# Patient Record
Sex: Male | Born: 1940 | Race: White | Hispanic: No | Smoking: Former smoker
Health system: Southern US, Community
[De-identification: ages and names within clinical notes are randomized; demographics above are authoritative.]

## PROBLEM LIST (undated history)

## (undated) DIAGNOSIS — D126 Benign neoplasm of colon, unspecified: Secondary | ICD-10-CM

## (undated) DIAGNOSIS — N529 Male erectile dysfunction, unspecified: Secondary | ICD-10-CM

## (undated) DIAGNOSIS — G4700 Insomnia, unspecified: Secondary | ICD-10-CM

## (undated) DIAGNOSIS — Z8551 Personal history of malignant neoplasm of bladder: Secondary | ICD-10-CM

## (undated) DIAGNOSIS — J310 Chronic rhinitis: Secondary | ICD-10-CM

## (undated) DIAGNOSIS — D179 Benign lipomatous neoplasm, unspecified: Secondary | ICD-10-CM

## (undated) DIAGNOSIS — M549 Dorsalgia, unspecified: Secondary | ICD-10-CM

## (undated) DIAGNOSIS — D649 Anemia, unspecified: Secondary | ICD-10-CM

## (undated) DIAGNOSIS — G629 Polyneuropathy, unspecified: Secondary | ICD-10-CM

## (undated) DIAGNOSIS — I2510 Atherosclerotic heart disease of native coronary artery without angina pectoris: Secondary | ICD-10-CM

## (undated) DIAGNOSIS — K648 Other hemorrhoids: Secondary | ICD-10-CM

## (undated) DIAGNOSIS — I509 Heart failure, unspecified: Secondary | ICD-10-CM

## (undated) DIAGNOSIS — I10 Essential (primary) hypertension: Secondary | ICD-10-CM

## (undated) DIAGNOSIS — K5790 Diverticulosis of intestine, part unspecified, without perforation or abscess without bleeding: Secondary | ICD-10-CM

## (undated) DIAGNOSIS — E782 Mixed hyperlipidemia: Secondary | ICD-10-CM

## (undated) DIAGNOSIS — G8929 Other chronic pain: Secondary | ICD-10-CM

## (undated) HISTORY — DX: Other chronic pain: G89.29

## (undated) HISTORY — DX: Polyneuropathy, unspecified: G62.9

## (undated) HISTORY — DX: Essential (primary) hypertension: I10

## (undated) HISTORY — DX: Benign lipomatous neoplasm, unspecified: D17.9

## (undated) HISTORY — DX: Personal history of malignant neoplasm of bladder: Z85.51

## (undated) HISTORY — DX: Benign neoplasm of colon, unspecified: D12.6

## (undated) HISTORY — DX: Other hemorrhoids: K64.8

## (undated) HISTORY — DX: Atherosclerotic heart disease of native coronary artery without angina pectoris: I25.10

## (undated) HISTORY — DX: Anemia, unspecified: D64.9

## (undated) HISTORY — DX: Insomnia, unspecified: G47.00

## (undated) HISTORY — DX: Chronic rhinitis: J31.0

## (undated) HISTORY — DX: Diverticulosis of intestine, part unspecified, without perforation or abscess without bleeding: K57.90

## (undated) HISTORY — DX: Mixed hyperlipidemia: E78.2

## (undated) HISTORY — DX: Other chronic pain: M54.9

## (undated) HISTORY — PX: TONSILLECTOMY: SUR1361

## (undated) HISTORY — DX: Male erectile dysfunction, unspecified: N52.9

---

## 1992-10-05 HISTORY — PX: CORONARY ARTERY BYPASS GRAFT: SHX141

## 2007-08-26 ENCOUNTER — Ambulatory Visit: Payer: Self-pay | Admitting: Gastroenterology

## 2007-09-09 ENCOUNTER — Encounter: Payer: Self-pay | Admitting: Gastroenterology

## 2007-09-09 ENCOUNTER — Ambulatory Visit: Payer: Self-pay | Admitting: Gastroenterology

## 2008-02-20 ENCOUNTER — Encounter: Payer: Self-pay | Admitting: Family Medicine

## 2008-12-27 ENCOUNTER — Ambulatory Visit: Payer: Self-pay | Admitting: Family Medicine

## 2008-12-27 DIAGNOSIS — E782 Mixed hyperlipidemia: Secondary | ICD-10-CM

## 2008-12-27 DIAGNOSIS — F528 Other sexual dysfunction not due to a substance or known physiological condition: Secondary | ICD-10-CM | POA: Insufficient documentation

## 2008-12-27 DIAGNOSIS — E785 Hyperlipidemia, unspecified: Secondary | ICD-10-CM | POA: Insufficient documentation

## 2008-12-27 DIAGNOSIS — I10 Essential (primary) hypertension: Secondary | ICD-10-CM | POA: Insufficient documentation

## 2008-12-27 HISTORY — DX: Mixed hyperlipidemia: E78.2

## 2008-12-28 LAB — CONVERTED CEMR LAB
AST: 24 units/L (ref 0–37)
Chloride: 103 meq/L (ref 96–112)
Cholesterol: 143 mg/dL (ref 0–200)
GFR calc non Af Amer: 102.6 mL/min (ref >60)
Glucose, Bld: 97 mg/dL (ref 70–99)
HDL: 40.1 mg/dL (ref >39.00)
Total Bilirubin: 1.2 mg/dL (ref 0.3–1.2)
Total CHOL/HDL Ratio: 4
Total Protein: 7.4 g/dL (ref 6.0–8.3)
Triglycerides: 146 mg/dL (ref 0.0–149.0)

## 2009-03-27 ENCOUNTER — Telehealth: Payer: Self-pay | Admitting: *Deleted

## 2009-07-12 ENCOUNTER — Encounter: Admission: RE | Admit: 2009-07-12 | Discharge: 2009-07-12 | Payer: Self-pay | Admitting: Neurosurgery

## 2009-09-06 ENCOUNTER — Encounter (INDEPENDENT_AMBULATORY_CARE_PROVIDER_SITE_OTHER): Payer: Self-pay | Admitting: *Deleted

## 2009-09-06 ENCOUNTER — Ambulatory Visit: Payer: Self-pay | Admitting: Internal Medicine

## 2009-09-06 ENCOUNTER — Inpatient Hospital Stay (HOSPITAL_COMMUNITY): Admission: EM | Admit: 2009-09-06 | Discharge: 2009-09-09 | Payer: Self-pay | Admitting: Emergency Medicine

## 2009-09-06 ENCOUNTER — Telehealth: Payer: Self-pay | Admitting: Family Medicine

## 2009-09-06 LAB — CONVERTED CEMR LAB
CO2: 26 meq/L
Calcium: 9 mg/dL
Cholesterol: 116 mg/dL
Creatinine, Ser: 1.09 mg/dL
Sodium: 139 meq/L

## 2009-09-08 ENCOUNTER — Encounter (INDEPENDENT_AMBULATORY_CARE_PROVIDER_SITE_OTHER): Payer: Self-pay | Admitting: *Deleted

## 2009-09-08 LAB — CONVERTED CEMR LAB
Calcium: 8.8 mg/dL
Chloride: 107 meq/L
GFR calc non Af Amer: >60 mL/min
Glomerular Filtration Rate, Af Am: >60 mL/min/{1.73_m2}
Glucose, Bld: 98 mg/dL
Sodium: 141 meq/L

## 2009-09-19 ENCOUNTER — Encounter: Admission: RE | Admit: 2009-09-19 | Discharge: 2009-09-19 | Payer: Self-pay | Admitting: Neurosurgery

## 2009-10-07 ENCOUNTER — Encounter (INDEPENDENT_AMBULATORY_CARE_PROVIDER_SITE_OTHER): Payer: Self-pay | Admitting: *Deleted

## 2009-10-07 ENCOUNTER — Ambulatory Visit: Payer: Self-pay | Admitting: Cardiology

## 2009-10-07 DIAGNOSIS — I2510 Atherosclerotic heart disease of native coronary artery without angina pectoris: Secondary | ICD-10-CM | POA: Insufficient documentation

## 2009-10-07 HISTORY — DX: Atherosclerotic heart disease of native coronary artery without angina pectoris: I25.10

## 2009-11-11 ENCOUNTER — Telehealth: Payer: Self-pay | Admitting: Family Medicine

## 2009-11-15 ENCOUNTER — Ambulatory Visit: Payer: Self-pay | Admitting: Family Medicine

## 2009-11-15 DIAGNOSIS — J310 Chronic rhinitis: Secondary | ICD-10-CM

## 2009-11-15 DIAGNOSIS — D179 Benign lipomatous neoplasm, unspecified: Secondary | ICD-10-CM | POA: Insufficient documentation

## 2009-11-15 HISTORY — DX: Chronic rhinitis: J31.0

## 2009-11-15 LAB — CONVERTED CEMR LAB
Cholesterol, target level: 200 mg/dL
HDL goal, serum: 40 mg/dL
LDL Goal: 100 mg/dL
Specific Gravity, Urine: 1.025
pH: 5

## 2010-01-02 ENCOUNTER — Telehealth: Payer: Self-pay | Admitting: Family Medicine

## 2010-03-22 ENCOUNTER — Encounter: Payer: Self-pay | Admitting: Cardiology

## 2010-03-30 LAB — CONVERTED CEMR LAB
ALT: 19 units/L (ref 0–53)
AST: 19 units/L (ref 0–37)
Albumin: 4.3 g/dL (ref 3.5–5.2)
Cholesterol: 139 mg/dL (ref 0–200)
HDL: 35 mg/dL — ABNORMAL LOW (ref >39)
Indirect Bilirubin: 0.7 mg/dL (ref 0.0–0.9)
Total Bilirubin: 0.9 mg/dL (ref 0.3–1.2)
Total CHOL/HDL Ratio: 4
Total Protein: 6.9 g/dL (ref 6.0–8.3)
Triglycerides: 148 mg/dL (ref <150)
VLDL: 30 mg/dL (ref 0–40)

## 2010-03-31 ENCOUNTER — Encounter: Payer: Self-pay | Admitting: Cardiology

## 2010-04-24 ENCOUNTER — Ambulatory Visit: Payer: Self-pay | Admitting: Family Medicine

## 2010-04-24 DIAGNOSIS — G4700 Insomnia, unspecified: Secondary | ICD-10-CM | POA: Insufficient documentation

## 2010-05-07 ENCOUNTER — Ambulatory Visit: Payer: Self-pay | Admitting: Cardiology

## 2010-05-08 ENCOUNTER — Encounter: Payer: Self-pay | Admitting: Cardiology

## 2010-05-14 ENCOUNTER — Ambulatory Visit: Payer: Self-pay | Admitting: Family Medicine

## 2010-05-14 DIAGNOSIS — R300 Dysuria: Secondary | ICD-10-CM | POA: Insufficient documentation

## 2010-05-14 LAB — CONVERTED CEMR LAB
Glucose, Urine, Semiquant: NEGATIVE
Ketones, urine, test strip: NEGATIVE
Protein, U semiquant: NEGATIVE
Specific Gravity, Urine: <1.005
Urobilinogen, UA: 0.2
pH: 6

## 2010-05-15 ENCOUNTER — Encounter: Payer: Self-pay | Admitting: Family Medicine

## 2010-11-04 NOTE — Assessment & Plan Note (Signed)
Summary: med refills/nn   Vital Signs:  Patient profile:   70 year old male Weight:      247 pounds Temp:     98.7 degrees F oral BP sitting:   142 / 80  (left arm) Cuff size:   regular  Vitals Entered By: Sid Falcon LPN (April 24, 2010 1:25 PM) CC: med refills, Hypertension Management   History of Present Illness: Medical followup. Patient has history of coronary artery disease, hypertension, hyperlipidemia, erectile dysfunction.  patient had recent lipid panel. Lipids were favorable. No recent chest pains.  hypertension fair control by home readings. No recent headaches. Occasional vertigo symptoms mostly at night and early morning. Patient needs refills of medication including lisinopril, Lipitor, and gabapentin.  patient complains of some difficulty falling asleep. He's not tried any medications for this. No daytime napping. No late day caffeine use. No alcohol use.  Hypertension History:      He denies headache, chest pain, palpitations, dyspnea with exertion, orthopnea, PND, peripheral edema, visual symptoms, neurologic problems, syncope, and side effects from treatment.        Positive major cardiovascular risk factors include male age 23 years old or older, hyperlipidemia, and hypertension.  Negative major cardiovascular risk factors include no history of diabetes, negative family history for ischemic heart disease, and non-tobacco-user status.        Positive history for target organ damage include ASHD (either angina/prior MI/prior CABG).  Further assessment for target organ damage reveals no history of stroke/TIA or peripheral vascular disease.     Allergies (verified): No Known Drug Allergies  Past History:  Past Medical History: Last updated: 10/07/2009 Hyperlipidemia Hypertension CAD - multivessel, LVEF 55-60 %, basal inferior hypokinesis Erectile dysfunction Peripheral neuropathy Chronic back pain  Review of Systems  The patient denies anorexia, fever,  weight loss, chest pain, syncope, dyspnea on exertion, peripheral edema, abdominal pain, melena, hematochezia, and severe indigestion/heartburn.    Physical Exam  General:  Well-developed,well-nourished,in no acute distress; alert,appropriate and cooperative throughout examination Mouth:  Oral mucosa and oropharynx without lesions or exudates.  Teeth in good repair. Neck:  No deformities, masses, or tenderness noted. Lungs:  Normal respiratory effort, chest expands symmetrically. Lungs are clear to auscultation, no crackles or wheezes. Heart:  Normal rate and regular rhythm. S1 and S2 normal without gallop, murmur, click, rub or other extra sounds. Extremities:  no edema Psych:  normally interactive, good eye contact, not anxious appearing, and not depressed appearing.     Impression & Recommendations:  Problem # 1:  HYPERTENSION (ICD-401.9)  His updated medication list for this problem includes:    Lisinopril 10 Mg Tabs (Lisinopril) ..... Once daily    Metoprolol Tartrate 25 Mg Tabs (Metoprolol tartrate) .Marland Kitchen... Take 1 tab two times a day  Orders: Prescription Created Electronically [phone])  Problem # 2:  HYPERLIPIDEMIA (ICD-272.4)  The following medications were removed from the medication list:    Vytorin 10-80 Mg Tabs (Ezetimibe-simvastatin) ..... Once daily His updated medication list for this problem includes:    Lipitor 40 Mg Tabs (Atorvastatin calcium) ..... One by mouth once daily  Orders: Prescription Created Electronically [phone])  Problem # 3:  CORONARY ATHEROSCLEROSIS NATIVE CORONARY ARTERY (ICD-414.01)  His updated medication list for this problem includes:    Lisinopril 10 Mg Tabs (Lisinopril) ..... Once daily    Aspirin Adult Low Strength 81 Mg Tbec (Aspirin) ..... Once daily    Plavix 75 Mg Tabs (Clopidogrel bisulfate) .Marland Kitchen... Take 1 tab daily  Isosorbide Mononitrate Cr 30 Mg Xr24h-tab (Isosorbide mononitrate) .Marland Kitchen... Take 1 tab daily    Metoprolol Tartrate 25 Mg  Tabs (Metoprolol tartrate) .Marland Kitchen... Take 1 tab two times a day    Nitrostat 0.4 Mg Subl (Nitroglycerin) .Marland Kitchen... Take as needed for chest pain  Problem # 4:  INSOMNIA (ICD-780.52) discused sleep hygiene.  He will try short term use of tylenol PM.  Complete Medication List: 1)  Gabapentin 300 Mg Caps (Gabapentin) .... Two tabs po two times a day 2)  Lisinopril 10 Mg Tabs (Lisinopril) .... Once daily 3)  Aspirin Adult Low Strength 81 Mg Tbec (Aspirin) .... Once daily 4)  Plavix 75 Mg Tabs (Clopidogrel bisulfate) .... Take 1 tab daily 5)  Isosorbide Mononitrate Cr 30 Mg Xr24h-tab (Isosorbide mononitrate) .... Take 1 tab daily 6)  Metoprolol Tartrate 25 Mg Tabs (Metoprolol tartrate) .... Take 1 tab two times a day 7)  Nitrostat 0.4 Mg Subl (Nitroglycerin) .... Take as needed for chest pain 8)  Hydrocodone-acetaminophen 10-500 Mg Tabs (Hydrocodone-acetaminophen) .... Take as needed for pian 9)  Fluticasone Propionate 50 Mcg/act Susp (Fluticasone propionate) .... 2 sprays per nostril once daily 10)  Lipitor 40 Mg Tabs (Atorvastatin calcium) .... One by mouth once daily  Hypertension Assessment/Plan:      The patient's hypertensive risk group is category C: Target organ damage and/or diabetes.  Today's blood pressure is 142/80.    Patient Instructions: 1)  Please schedule a follow-up appointment in 6 months .  2)  Try Tylenol PM as needed for sleep Prescriptions: LIPITOR 40 MG TABS (ATORVASTATIN CALCIUM) one by mouth once daily  #30 x 11   Entered and Authorized by:   Evelena Peat MD   Signed by:   Evelena Peat MD on 04/24/2010   Method used:   Electronically to        [facility]* (retail)       [address]       [place], [place]  [address]       Ph: [phone]       Fax: [phone]   RxID:   [id number] LISINOPRIL 10 MG TABS (LISINOPRIL) once daily  #90 Each x 3   Entered and Authorized by:   Evelena Peat MD   Signed by:   Evelena Peat MD  on 04/24/2010   Method used:   Electronically to        [facility]* (retail)       [address]       [place], [place]  [address]       Ph: [phone]       Fax: [phone]   RxID:   [id number] GABAPENTIN 300 MG CAPS (GABAPENTIN) two tabs po two times a day  #120 x 11   Entered and Authorized by:   Evelena Peat MD   Signed by:   Evelena Peat MD on 04/24/2010   Method used:   Electronically to        [facility]* (retail)       [address]       [place], [place]  [address]       Ph: [phone]       Fax: [phone]   RxID:   [id number]

## 2010-11-04 NOTE — Assessment & Plan Note (Signed)
Summary: Y9M  Medications Added ISOSORBIDE MONONITRATE CR 30 MG XR24H-TAB (ISOSORBIDE MONONITRATE) take 1 tab daily      Allergies Added: NKDA  Visit Type:  Follow-up Primary Provider:  Dr. Evelena Peat   History of Present Illness: 70 year old male presents for followup. He denies any significant angina, nitroglycerin use, or progressive shortness of breath. He reports a recent followup visit with his primary care physician.  Followup labs from 18 June showed cholesterol 139, triglycerides 148, HDL 35, LDL 74, AST 19, ALT 19. We reviewed these today. He is tolerating Lipitor.  Reports problems with back pain, and saw Dr. Jeral Fruit recently. No plans for surgery.   Current Medications (verified): 1)  Gabapentin 300 Mg Caps (Gabapentin) .... Two Tabs Po Two Times A Day 2)  Lisinopril 10 Mg Tabs (Lisinopril) .... Once Daily 3)  Aspirin Adult Low Strength 81 Mg Tbec (Aspirin) .... Once Daily 4)  Plavix 75 Mg Tabs (Clopidogrel Bisulfate) .... Take 1 Tab Daily 5)  Isosorbide Mononitrate Cr 30 Mg Xr24h-Tab (Isosorbide Mononitrate) .... Take 1 Tab Daily 6)  Metoprolol Tartrate 25 Mg Tabs (Metoprolol Tartrate) .... Take 1 Tab Two Times A Day 7)  Nitrostat 0.4 Mg Subl (Nitroglycerin) .... Take As Needed For Chest Pain 8)  Hydrocodone-Acetaminophen 10-500 Mg Tabs (Hydrocodone-Acetaminophen) .... Take As Needed For Pian 9)  Fluticasone Propionate 50 Mcg/act Susp (Fluticasone Propionate) .... 2 Sprays Per Nostril Once Daily 10)  Lipitor 40 Mg Tabs (Atorvastatin Calcium) .... 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         Otherwise reviewed and negative except as outlined.  Vital Signs:  Patient profile:   70 year old male Weight:      248 pounds BMI:     30.30 Pulse rate:   55 / minute BP sitting:   113 / 64  (right arm)  Vitals Entered By: Dreama Saa, CNA (May 07, 2010 1:29 PM)  Physical Exam  Additional Exam:  Overweight male in no acute distress. HEENT: Conjunctiva and lids normal, oropharynx with poor dentition. Neck: Supple, no elevated jugular venous pressure, no thyromegaly. Lungs: Diminished breath sounds, nonlabored. Cardiac: Regular rate and rhythm, no S3 gallop. Soft S4. Abdomen: Soft, nontender, no organomegaly, bowel sounds present. Extremities: Trace ankle edema. Distal pulses 1-2+. Skin: Warm and dry. Musculoskeletal: No kyphosis. Neuropsychiatric: Alert and oriented x3, affect appropriate.   Cardiac Cath  Procedure date:  09/09/2009  Findings:       FINDINGS:   1. Hemodynamics:  LV 123/60, aorta 126/62.   2. Left ventriculography:  EF was 55-60%.  There was hypokinesis of       the basal inferior wall.  Otherwise, visualized wall segments       function normally.   3. RCA:  There was a long subtotal occlusion of the RCA beginning       proximally.  The distal RCA received collaterals from the LAD and       to a lesser extent from the  circumflex system.   4. Left main:  Left main had about 30% distal stenosis.   5. Left circumflex system:  There was about 40% ostial stenosis of the       left circumflex.  There was a moderate first obtuse marginal with       40% stenosis in the body of the vessel.  There were small second       and third obtuse marginals.   6. LAD system:  The LAD was occluded proximally after the takeoff of       the  large first diagonal.  The first diagonal was patent.  The mid       to distal LAD was reconstituted via LIMA graft.  The LIMA was large       and patent.  The touchdown was patent and runoff of the LAD had       about 30% distal LAD stenosis.   7. Grafts:  The vein graft to the RCA was totally occluded at the       ostium.  The vein graft to the diagonal was totally occluded at the       ostium, and the LIMA to the LAD was patent.  EKG  Procedure date:  05/07/2010  Findings:      Sinus bradycardia at 57 beats per minute, poor R wave progression anteriorly, nonspecific T-wave changes.  Impression & Recommendations:  Problem # 1:  CORONARY ATHEROSCLEROSIS NATIVE CORONARY ARTERY (ICD-414.01)  Symptomatically stable on medical therapy. No changes made today. Followup in 6 months.  His updated medication list for this problem includes:    Lisinopril 10 Mg Tabs (Lisinopril) ..... Once daily    Aspirin Adult Low Strength 81 Mg Tbec (Aspirin) ..... Once daily    Plavix 75 Mg Tabs (Clopidogrel bisulfate) .Marland Kitchen... Take 1 tab daily    Isosorbide Mononitrate Cr 30 Mg Xr24h-tab (Isosorbide mononitrate) .Marland Kitchen... Take 1 tab daily    Metoprolol Tartrate 25 Mg Tabs (Metoprolol tartrate) .Marland Kitchen... Take 1 tab two times a day    Nitrostat 0.4 Mg Subl (Nitroglycerin) .Marland Kitchen... Take as needed for chest pain  Problem # 2:  HYPERLIPIDEMIA (ICD-272.4)  LDL well controlled on present dose of Lipitor. Followup lipid profile and liver function tests prior to next visit.  His updated medication list for this problem includes:    Lipitor 40 Mg Tabs (Atorvastatin calcium) ..... One by mouth once daily  Future Orders: T-Lipid Profile ([id number]) ... 11/03/2010 T-Hepatic Function [phone]) ... 11/03/2010  Problem # 3:  HYPERTENSION (ICD-401.9)  Blood pressure well controlled today.  His updated medication list for this problem includes:    Lisinopril 10 Mg Tabs (Lisinopril) ..... Once daily    Aspirin Adult  Low Strength 81 Mg Tbec (Aspirin) ..... Once daily    Metoprolol Tartrate 25 Mg Tabs (Metoprolol tartrate) .Marland Kitchen... Take 1 tab two times a day  Patient Instructions: 1)  Your physician recommends that you schedule a follow-up appointment in: 6 months 2)  Your physician recommends that you return for lab work in: 6 months, just before next office visit. 3)  Your physician recommends that you continue on your current medications as directed. Please refer to the Current Medication list given to you today. Prescriptions: ISOSORBIDE MONONITRATE CR 30 MG XR24H-TAB (ISOSORBIDE MONONITRATE) take 1 tab daily  #30 x 5   Entered by:   Larita Fife Via LPN   Authorized by:   Loreli Slot, MD, [facility]   Signed by:   Larita Fife  Via LPN on 20/25/4270   Method used:   Electronically to        [facility]* (retail)       [address]       [place], [place]  [address]       Ph: [phone]       Fax: [phone]   RxID:   [id number]

## 2010-11-04 NOTE — Letter (Signed)
Summary: [facility], [facility] at [employer]  [address], [place] [address]   Phone: [phone]  Fax: [phone]     October 07, 2009 MRN: [id number]   Linley Leverette [address] [place], [place]  [address]      YOUR LAB WORK IS DUE  The week of March 31, 2010 _________________________________________  Please go to [facility], located across the street from [facility] on the second floor.  Hours are Monday - Friday 7am until 7:30pm         Saturday 8am until 12noon    __  DO NOT EAT OR DRINK AFTER MIDNIGHT EVENING PRIOR TO LABWORK  __ YOUR LABWORK IS NOT FASTING --YOU MAY EAT PRIOR TO LABWORK

## 2010-11-04 NOTE — Progress Notes (Signed)
Summary: REFILL Gabapentin X 3 months, pt will need OV  Phone Note Refill Request Message from:  Fax from Pharmacy  Refills Requested: Medication #1:  GABAPENTIN 300 MG CAPS two tabs po two times a day [facility] PH---[phone]      FAX----[phone]  Initial call taken by: Warnell Forester,  January 02, 2010 11:16 AM    Prescriptions: GABAPENTIN 300 MG CAPS (GABAPENTIN) two tabs po two times a day  #120 x 3   Entered by:   Sid Falcon LPN   Authorized by:   Evelena Peat MD   Signed by:   Sid Falcon LPN on 60/07/9322   Method used:   Electronically to        [facility]* (retail)       [address]       [place], [place]  [address]       Ph: [phone]       Fax: [phone]   RxID:   [id number]

## 2010-11-04 NOTE — Progress Notes (Signed)
Summary: call from pt's wife to change meds.  Phone Note Call from Patient   Caller: Spouse Call For: Evelena Peat MD Summary of Call: Needs substitute for Vytorin.......insurance is not paying for it. [phone] Initial call taken by: Lynann Beaver CMA,  November 11, 2009 9:45 AM  Follow-up for Phone Call        He is due for office f/u.  Rec f/u and discuss options. Follow-up by: Evelena Peat MD,  November 11, 2009 10:36 AM  Additional Follow-up for Phone Call Additional follow up Details #1::        Pt has appt Friday. Additional Follow-up by: Kern Reap CMA Duncan Dull),  November 11, 2009 10:52 AM

## 2010-11-04 NOTE — Miscellaneous (Signed)
Summary: labs hosp 09/07/2009-09/09/09  Clinical Lists Changes  Observations: Added new observation of CALCIUM: 8.8 mg/dL (16/07/9603 54:09) Added new observation of GFR AA: >60 mL/min/1.37m2 (09/08/2009 10:14) Added new observation of GFR: >60 mL/min (09/08/2009 10:14) Added new observation of CREATININE: 0.96 mg/dL (81/19/1478 29:56) Added new observation of BUN: 10 mg/dL (21/30/8657 84:69) Added new observation of BG RANDOM: 98 mg/dL (62/95/2841 32:44) Added new observation of CO2 PLSM/SER: 29 meq/L (09/08/2009 10:14) Added new observation of CL SERUM: 107 meq/L (09/08/2009 10:14) Added new observation of K SERUM: 3.9 meq/L (09/08/2009 10:14) Added new observation of NA: 141 meq/L (09/08/2009 10:14) Added new observation of CALCIUM: 9.0 mg/dL (10/07/7251 66:44) Added new observation of GFR AA: >60 mL/min/1.3m2 (09/06/2009 10:14) Added new observation of GFR: >60 mL/min (09/06/2009 10:14) Added new observation of CREATININE: 1.09 mg/dL (03/47/4259 56:38) Added new observation of BUN: 9 mg/dL (75/64/3329 51:88) Added new observation of BG RANDOM: 152 mg/dL (41/66/0630 16:01) Added new observation of CO2 PLSM/SER: 26 meq/L (09/06/2009 10:14) Added new observation of CL SERUM: 108 meq/L (09/06/2009 10:14) Added new observation of K SERUM: 3.6 meq/L (09/06/2009 10:14) Added new observation of NA: 139 meq/L (09/06/2009 10:14) Added new observation of LDL: 56 mg/dL (09/32/3557 32:20) Added new observation of HDL: 37 mg/dL (25/42/7062 37:62) Added new observation of TRIGLYC TOT: 115 mg/dL (83/15/1761 60:73) Added new observation of CHOLESTEROL: 116 mg/dL (71/03/2693 85:46)

## 2010-11-04 NOTE — Assessment & Plan Note (Signed)
Summary: CHECK KNOT BEHIND EAR//CCM   Vital Signs:  Patient profile:   70 year old male Temp:     97.6 degrees F oral BP sitting:   140 / 80  (left arm) Cuff size:   regular  Vitals Entered By: Sid Falcon LPN (November 15, 2009 1:32 PM) CC: Knot behind left ear, getting bigger lately, Lipid Management   History of Present Illness: Patient seen for the following items.  Non-painful swelling behind left ear which he noticed several months ago. Not growing actively in size. Remote history of skull fracture same side 1967. No recent headaches. No drainage.  CAD history. Patient on Vytorin 10/80 for hyperlipidemia. Insurance currently not covering. He'll try to explore other options. No side effects from Vytorin. He does not recall being on other statin medications previously.  Frequent clear rhinorrhea. Sometimes brought on by things like eating and change of temperature. No recent purulent secretions. No sneezing. No facial pain.  Lipid Management History:      Positive NCEP/ATP III risk factors include male age 29 years old or older, HDL cholesterol less than 40, hypertension, and ASHD (either angina/prior MI/prior CABG).  Negative NCEP/ATP III risk factors include non-diabetic, no family history for ischemic heart disease, non-tobacco-user status, no prior stroke/TIA, no peripheral vascular disease, and no history of aortic aneurysm.     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    Physical Exam  General:  Well-developed,well-nourished,in no acute distress; alert,appropriate and cooperative throughout examination Head:  patient has rounded nontender swelling posterior to left ear. No overlying skin changes. Approximately 1 cm in size Eyes:  No corneal or conjunctival inflammation noted. EOMI. Perrla. Funduscopic exam benign, without hemorrhages, exudates or papilledema. Vision grossly normal. Mouth:  Oral mucosa and oropharynx without lesions or exudates.  Teeth in good repair. Neck:  No deformities, masses, or tenderness noted. Lungs:  Normal respiratory effort, chest expands symmetrically. Lungs are clear to auscultation, no crackles or wheezes. Heart:  normal rate and regular rhythm.   Extremities:  no edema Neurologic:  alert & oriented X3 and cranial nerves II-XII intact.   Skin:  Intact without suspicious lesions or rashes Cervical Nodes:  No lymphadenopathy noted Psych:  normally interactive, good eye contact, not anxious appearing, and not depressed appearing.     Impression & Recommendations:  Problem # 1:  LIPOMA (ICD-214.9) Assessment New suspect lipoma behind ear. Cannot rule out small sebaceous cyst. Reassurance given. No further intervention recommended  Problem # 2:  HYPERLIPIDEMIA (ICD-272.4) Assessment: Unchanged discussed options with patient. We explained that we would not want to go to generic that would likely be inferior for lipid control given his history of CAD. He'll determine if Crestor or Lipitor are covered by his insurance.  Discussed that Lipitor will be going generic and he will see if this is covered. His updated medication list for this problem includes:    Vytorin 10-80 Mg Tabs (Ezetimibe-simvastatin) ..... Once daily    Lipitor 40 Mg Tabs (Atorvastatin calcium) ..... One by mouth  once daily  Problem # 3:  RHINITIS (ICD-472.0) Assessment:  New question of nonallergic rhinitis. Trial of fluticasone nasal  Complete Medication List: 1)  Vytorin 10-80 Mg Tabs (Ezetimibe-simvastatin) .... Once daily 2)  Gabapentin 300 Mg Caps (Gabapentin) .... Two tabs po two times a day 3)  Lisinopril 10 Mg Tabs (Lisinopril) .... Once daily 4)  Aspirin Adult Low Strength 81 Mg Tbec (Aspirin) .... Once daily 5)  Plavix 75 Mg Tabs (Clopidogrel bisulfate) .... Take 1 tab daily 6)  Isosorbide Mononitrate Cr 30 Mg Xr24h-tab (Isosorbide mononitrate) .... Take 1 tab daily 7)  Metoprolol Tartrate 25 Mg Tabs (Metoprolol tartrate) .... Take 1 tab two times a day 8)  Nitrostat 0.4 Mg Subl (Nitroglycerin) .... Take as needed for chest pain 9)  Hydrocodone-acetaminophen 10-500 Mg Tabs (Hydrocodone-acetaminophen) .... Take as needed for pian 10)  Fluticasone Propionate 50 Mcg/act Susp (Fluticasone propionate) .... 2 sprays per nostril once daily 11)  Lipitor 40 Mg Tabs (Atorvastatin calcium) .... One by mouth once daily  Lipid Assessment/Plan:      Based on NCEP/ATP III, the patient's risk factor category is "history of coronary disease, peripheral vascular disease, cerebrovascular disease, or aortic aneurysm".  The patient's lipid goals are as follows: Total cholesterol goal is 200; LDL cholesterol goal is 100; HDL cholesterol goal is 40; Triglyceride goal is 150.   Prescriptions: LIPITOR 40 MG TABS (ATORVASTATIN CALCIUM) one by mouth once daily  #30 x 5   Entered and Authorized by:   Evelena Peat MD   Signed by:   Evelena Peat MD on 11/15/2009   Method used:   Print then Give to Patient   RxID:   [id number] FLUTICASONE PROPIONATE 50 MCG/ACT SUSP (FLUTICASONE PROPIONATE) 2 sprays per nostril once daily  #1 x 5   Entered and Authorized by:   Evelena Peat MD   Signed by:   Evelena Peat MD on 11/15/2009   Method used:   Print then Give to Patient   RxID:   [id number]   Laboratory Results   Urine Tests    Routine Urinalysis    Color: yellow Appearance: Clear Glucose: negative   (Normal Range: Negative) Bilirubin: negative   (Normal Range: Negative) Ketone: negative   (Normal Range: Negative) Spec. Gravity: 1.025   (Normal Range: 1.003-1.035) Blood: trace-lysed   (Normal Range: Negative) pH: 5.0   (Normal Range: 5.0-8.0) Protein: negative   (Normal Range: Negative) Urobilinogen: 0.2   (Normal Range: 0-1) Nitrite: negative   (Normal Range: Negative) Leukocyte Esterace: negative   (Normal Range: Negative)    Comments: Sid Falcon LPN  November 15, 2009 1:45 PM

## 2010-11-04 NOTE — Letter (Signed)
Summary: [place] [facility] Work [employer], agricultural at [employer]  [address], [place] [address]   Phone: [phone]  Fax: [phone]     May 07, 2010 MRN: [id number]   Rodney Barnett [address] [place], [place]  [address]      YOUR LAB WORK IS DUE  The week of November 03, 2010 _________________________________________  Please go to [facility], located across the street from [facility] on the second floor.  Hours are Monday - Friday 7am until 7:30pm         Saturday 8am until 12noon    _X_  DO NOT EAT OR DRINK AFTER MIDNIGHT EVENING PRIOR TO LABWORK  __ YOUR LABWORK IS NOT FASTING --YOU MAY EAT PRIOR TO LABWORK

## 2010-11-04 NOTE — Assessment & Plan Note (Signed)
Summary: fever,chills//ccm   Vital Signs:  Patient profile:   70 year old male Weight:      248 pounds Temp:     98.0 degrees F oral Pulse rate:   72 / minute BP sitting:   120 / 80  (left arm) Cuff size:   large  Vitals Entered By: Romualdo Bolk, CMA (AAMA) (May 14, 2010 9:09 AM) CC: Having trouble urinating this has been going on for since 8/6   History of Present Illness: Onset last Thursday of chills, ?fever, body aches. No cough.  No nasal congestion.  Sunday noted burn with urination. Also some urine frequency.  No chills now.   Perianal swelling over weekend and spont. rupture and  drainage on Sunday.   Preventive Screening-Counseling & Management  Alcohol-Tobacco     Alcohol drinks/day: 0     Smoking Status: quit  Caffeine-Diet-Exercise     Caffeine use/day: 3     Does Patient Exercise: yes  Current Medications (verified): 1)  Gabapentin 300 Mg Caps (Gabapentin) .... Two Tabs Po Two Times A Day 2)  Lisinopril 10 Mg Tabs (Lisinopril) .... Once Daily 3)  Aspirin Adult Low Strength 81 Mg Tbec (Aspirin) .... Once Daily 4)  Plavix 75 Mg Tabs (Clopidogrel Bisulfate) .... Take 1 Tab Daily 5)  Isosorbide Mononitrate Cr 30 Mg Xr24h-Tab (Isosorbide Mononitrate) .... Take 1 Tab Daily 6)  Metoprolol Tartrate 25 Mg Tabs (Metoprolol Tartrate) .... Take 1 Tab Two Times A Day 7)  Nitrostat 0.4 Mg Subl (Nitroglycerin) .... Take As Needed For Chest Pain 8)  Hydrocodone-Acetaminophen 10-500 Mg Tabs (Hydrocodone-Acetaminophen) .... Take As Needed For Pian 9)  Fluticasone Propionate 50 Mcg/act Susp (Fluticasone Propionate) .... 2 Sprays Per Nostril Once Daily 10)  Lipitor 40 Mg Tabs (Atorvastatin Calcium) .... 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    Physical Exam  General:  Well-developed,well-nourished,in no acute distress; alert,appropriate and cooperative throughout examination Lungs:  Normal respiratory effort, chest expands symmetrically. Lungs are clear to auscultation, no crackles or wheezes. Heart:  normal rate and regular rhythm.   Rectal:  No perianal abscess.  Does have ?sinus tract post to anus.  No erythema or drainage. Prostate:  moderately tender diffusely with no nodules.  Mildly swollen.  No rectal mass. Skin:  no rashes.     Impression & Recommendations:  Problem # 1:  DYSURIA (ICD-788.1) suspect UTI.  start Cipro and urine cx obtained. Orders: UA Dipstick w/o Micro (manual) ([id number]) T-Culture, Urine ([id number]) Prescription Created Electronically [phone])  His updated medication list for this problem includes:    Ciprofloxacin Hcl 500 Mg Tabs (Ciprofloxacin hcl) ..... One by mouth two times a day for 10 days  Complete Medication List: 1)  Gabapentin 300 Mg Caps (Gabapentin) .... Two tabs po two times a day 2)  Lisinopril 10 Mg Tabs (Lisinopril) .... Once daily 3)  Aspirin Adult Low Strength 81 Mg Tbec (Aspirin) .... Once daily 4)  Plavix 75 Mg Tabs (Clopidogrel bisulfate) .... Take 1 tab daily 5)  Isosorbide Mononitrate Cr 30 Mg Xr24h-tab (Isosorbide mononitrate) .... Take 1 tab daily 6)  Metoprolol Tartrate 25 Mg Tabs (Metoprolol tartrate) .... Take 1 tab two times a day 7)  Nitrostat 0.4 Mg Subl (Nitroglycerin) .... Take as needed for chest pain 8)  Hydrocodone-acetaminophen 10-500 Mg Tabs (Hydrocodone-acetaminophen) .... Take as needed for pian 9)  Fluticasone Propionate 50 Mcg/act Susp (Fluticasone propionate) .... 2 sprays per nostril once daily 10)   Lipitor 40 Mg Tabs (Atorvastatin calcium) .... One by mouth once daily 11)  Ciprofloxacin Hcl 500 Mg Tabs (Ciprofloxacin hcl) .... One by mouth two times a day for 10 days  Patient Instructions: 1)  Drink plenty of fluids up to 3-4 quarts a day. Cranberry juice is especially recommended in addition to large amounts of water. Avoid caffeine & carbonated drinks, they tend to irritate the bladder, Return in 3-5 days if you're not better: sooner if you're feeling worse.  2)  May try over the counter Pyridium for burning. Prescriptions: CIPROFLOXACIN HCL 500 MG TABS (CIPROFLOXACIN HCL) one by mouth two times a day for 10 days  #20 x 0   Entered and Authorized by:   Evelena Peat MD   Signed by:   Evelena Peat MD on 05/14/2010   Method used:   Electronically to        [facility]* (retail)       [address]       [place], [place]  [address]       Ph: [phone]       Fax: [phone]   RxID:   [id number]   Laboratory Results   Urine Tests  Date/Time Received: May 14, 2010 10:00 AM   Routine Urinalysis   Color: yellow Appearance: Clear Glucose: negative   (Normal Range: Negative) Bilirubin: negative   (Normal Range: Negative) Ketone: negative   (Normal Range: Negative) Spec. Gravity: <1.005   (Normal Range: 1.003-1.035) Blood: large   (Normal Range: Negative) pH: 6.0   (Normal Range: 5.0-8.0) Protein: negative   (Normal Range: Negative) Urobilinogen: 0.2   (Normal Range: 0-1) Nitrite: positive   (Normal Range: Negative) Leukocyte Esterace: moderate   (Normal Range: Negative)    Comments: Romualdo Bolk, CMA (AAMA)  May 14, 2010 10:01 AM

## 2010-11-04 NOTE — Letter (Signed)
Summary: [place] Results Engineer, agricultural at [facility]  [address], [place] [address]   Phone: [phone]  Fax: [phone]      March 31, 2010 MRN: [id number]   Rodney Barnett [address] [place], [place]  [address]   Dear [name],  Your test ordered by Selena Batten has been reviewed by your physician (or physician assistant) and was found to be normal or stable. Your physician (or physician assistant) felt no changes were needed at this time.  ____ Echocardiogram  ____ Cardiac Stress Test  __X__ Lab Work  ____ Peripheral vascular study of arms, legs or neck  ____ CT scan or X-ray  ____ Lung or Breathing test  ____ Other: Please continue on current medical treatment.  Thank you.   Nona Dell, MD, F.A.C.C

## 2010-11-04 NOTE — Assessment & Plan Note (Signed)
Summary: EPH 3-4 WEEKS  Medications Added PLAVIX 75 MG TABS (CLOPIDOGREL BISULFATE) take 1 tab daily ISOSORBIDE MONONITRATE CR 30 MG XR24H-TAB (ISOSORBIDE MONONITRATE) take 1 tab daily METOPROLOL TARTRATE 25 MG TABS (METOPROLOL TARTRATE) take 1 tab two times a day NITROSTAT 0.4 MG SUBL (NITROGLYCERIN) take as needed for chest pain HYDROCODONE-ACETAMINOPHEN 10-500 MG TABS (HYDROCODONE-ACETAMINOPHEN) take as needed for pian      Allergies Added: NKDA  Visit Type:  Follow-up Primary Provider:  Evelena Peat, MD   History of Present Illness: This is my first meeting with Rodney Barnett. He has a history of multivessel cardiovascular disease, outlined below. He has had no regular cardiology followup for several years. Recently he was admitted to the hospital in December with chest pain concerning for unstable angina. He ruled out for myocardial infarction and was referred for diagnostic cardiac catheterization.  Cardiac catheterization on December 6 demonstrated an LVEF of 55-60% with basal inferior hypokinesis. The LIMA to left anterior descending was patent, with occlusion of both the vein graft to the RCA, and vein graft to the diagonal. Fortunately the diagonal branch itself was patent, the circumflex demonstrated no obstructive disease, and there were left to right collaterals to the occluded right coronary artery. He was managed medically.  Since discharge, Rodney Barnett has done quite well, without any recurrent chest pain. I reviewed his medications today.  He suffers with chronic back pain, and may need back surgery at some point over the next year. He is followed by Dr. Jeral Fruit.  Current Medications (verified): 1)  Vytorin 10-80 Mg Tabs (Ezetimibe-Simvastatin) .... Once Daily 2)  Gabapentin 300 Mg Caps (Gabapentin) .... Two Tabs Po Two Times A Day 3)  Lisinopril 10 Mg Tabs (Lisinopril) .... Once Daily 4)  Aspirin Adult Low Strength 81 Mg Tbec (Aspirin) .... Once Daily 5)  Plavix 75 Mg Tabs  (Clopidogrel Bisulfate) .... Take 1 Tab Daily 6)  Isosorbide Mononitrate Cr 30 Mg Xr24h-Tab (Isosorbide Mononitrate) .... Take 1 Tab Daily 7)  Metoprolol Tartrate 25 Mg Tabs (Metoprolol Tartrate) .... Take 1 Tab Two Times A Day 8)  Nitrostat 0.4 Mg Subl (Nitroglycerin) .... Take As Needed For Chest Pain 9)  Hydrocodone-Acetaminophen 10-500 Mg Tabs (Hydrocodone-Acetaminophen) .... 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         Chronic back pain. He has undergone prior steroid injections for this. Otherwise reviewed and negative.  Vital Signs:  Patient profile:   70 year old male Weight:      245 pounds BMI:     29.93 Pulse rate:   58 / minute BP sitting:   120 / 80  (right arm)  Vitals Entered By: Dreama Saa, CNA (October 07, 2009 11:12 AM)  Physical Exam  Additional Exam:  Overweight male in no acute distress. HEENT: Conjunctiva and lids normal, oropharynx with  poor dentition. Neck: Supple, no elevated jugular venous pressure, no thyromegaly. Lungs: Diminished breath sounds, nonlabored. Cardiac: Regular rate and rhythm, no S3 gallop. Soft S4. Abdomen: Soft, nontender, no  organomegaly, bowel sounds present. Extremities: Trace ankle edema. Distal pulses 1-2+. Skin: Warm and dry. Musculoskeletal: No kyphosis. Neuropsychiatric: Alert and oriented x3, affect appropriate.   Impression & Recommendations:  Problem # 1:  CORONARY ATHEROSCLEROSIS NATIVE CORONARY ARTERY (ICD-414.01)  Stable status post recent presentation with angina, and findings of progressive graft disease at catheterization. Fortunately his LIMA to LAD is patent, the native diagonal is patent, and there are left to right collaterals to the occluded right coronary artery. Symptomatically he is reporting no problems on medical therapy. Followup will be arranged over the next 6 months.  The following medications were removed from the medication list:    Metoprolol Tartrate 50 Mg Tabs (Metoprolol tartrate) ..... Once daily His updated medication list for this problem includes:    Lisinopril 10 Mg Tabs (Lisinopril) ..... Once daily    Aspirin Adult Low Strength 81 Mg Tbec (Aspirin) ..... Once daily    Plavix 75 Mg Tabs (Clopidogrel bisulfate) .Marland Kitchen... Take 1 tab daily    Isosorbide Mononitrate Cr 30 Mg Xr24h-tab (Isosorbide mononitrate) .Marland Kitchen... Take 1 tab daily    Metoprolol Tartrate 25 Mg Tabs (Metoprolol tartrate) .Marland Kitchen... Take 1 tab two times a day    Nitrostat 0.4 Mg Subl (Nitroglycerin) .Marland Kitchen... Take as needed for chest pain  Problem # 2:  HYPERTENSION (ICD-401.9)  Blood pressure is well controlled today.  The following medications were removed from the medication list:    Metoprolol Tartrate 50 Mg Tabs (Metoprolol tartrate) ..... Once daily His updated medication list for this problem includes:    Lisinopril 10 Mg Tabs (Lisinopril) ..... Once daily    Aspirin Adult Low Strength 81 Mg Tbec (Aspirin) ..... Once daily    Metoprolol Tartrate 25 Mg Tabs (Metoprolol tartrate) .Marland Kitchen... Take 1 tab two times a day  Problem # 3:  HYPERLIPIDEMIA (ICD-272.4)  Recent lipids are reviewed above. LDL is at goal.  Followup lipid profile and liver function tests prior to his next visit in 6 months.  His updated medication list for this problem includes:    Vytorin 10-80 Mg Tabs (Ezetimibe-simvastatin) ..... Once daily  Other Orders: Future Orders: T-Lipid Profile ([id number]) ... 03/31/2010 T-Hepatic Function [id number]) ... 03/31/2010  Patient Instructions: 1)  Your physician recommends that you schedule a follow-up appointment in: 6 months 2)  Your physician recommends that you return for lab work in: June (before next office visit). 3)  Your physician recommends that you continue on your current medications as directed. Please refer to the Current Medication list given to you today.

## 2010-12-08 ENCOUNTER — Encounter: Payer: Self-pay | Admitting: Cardiology

## 2010-12-08 LAB — CONVERTED CEMR LAB
Cholesterol: 140 mg/dL (ref 0–200)
LDL Cholesterol: 66 mg/dL (ref 0–99)
Total Bilirubin: 1 mg/dL (ref 0.3–1.2)
VLDL: 41 mg/dL — ABNORMAL HIGH (ref 0–40)

## 2010-12-16 NOTE — Letter (Signed)
Summary: [place] Results Engineer, agricultural at [facility]  [address] [place] [address]   Phone: [phone]  Fax: [phone]      December 08, 2010 MRN: [id number]   Rodney Barnett [address] [place], [place]  [address]   Dear [name],  Your test ordered by Selena Batten has been reviewed by your physician (or physician assistant) and was found to be normal or stable. Your physician (or physician assistant) felt no changes were needed at this time.  ____ Echocardiogram  ____ Cardiac Stress Test  __X__ Lab Work  ____ Peripheral vascular study of arms, legs or neck  ____ CT scan or X-ray  ____ Lung or Breathing test  ____ Other:   Thank you.  Please continue on current medical treatment.  Nona Dell, MD, F.A.C.C

## 2011-01-06 ENCOUNTER — Encounter: Payer: Self-pay | Admitting: Family Medicine

## 2011-01-06 LAB — CBC
HCT: 45.3 % (ref 39.0–52.0)
Hemoglobin: 14.2 g/dL (ref 13.0–17.0)
MCHC: 34.7 g/dL (ref 30.0–36.0)
MCHC: 34.8 g/dL (ref 30.0–36.0)
MCV: 98.8 fL (ref 78.0–100.0)
MCV: 99.8 fL (ref 78.0–100.0)
Platelets: 106 10*3/uL — ABNORMAL LOW (ref 150–400)
Platelets: 129 10*3/uL — ABNORMAL LOW (ref 150–400)
RBC: 4.59 MIL/uL (ref 4.22–5.81)
RDW: 12.9 % (ref 11.5–15.5)
RDW: 13.3 % (ref 11.5–15.5)
RDW: 13.4 % (ref 11.5–15.5)
WBC: 3.9 10*3/uL — ABNORMAL LOW (ref 4.0–10.5)
WBC: 4.1 10*3/uL (ref 4.0–10.5)

## 2011-01-06 LAB — BASIC METABOLIC PANEL
CO2: 26 mEq/L (ref 19–32)
CO2: 29 mEq/L (ref 19–32)
Calcium: 8.8 mg/dL (ref 8.4–10.5)
Chloride: 108 mEq/L (ref 96–112)
Creatinine, Ser: 0.96 mg/dL (ref 0.4–1.5)
GFR calc Af Amer: >60 mL/min (ref >60)
GFR calc Af Amer: >60 mL/min (ref >60)
Glucose, Bld: 152 mg/dL — ABNORMAL HIGH (ref 70–99)
Glucose, Bld: 98 mg/dL (ref 70–99)
Potassium: 3.6 mEq/L (ref 3.5–5.1)
Sodium: 139 mEq/L (ref 135–145)

## 2011-01-06 LAB — POCT I-STAT, CHEM 8
Calcium, Ion: 1.15 mmol/L (ref 1.12–1.32)
HCT: 46 % (ref 39.0–52.0)
Sodium: 141 mEq/L (ref 135–145)
TCO2: 27 mmol/L (ref 0–100)

## 2011-01-06 LAB — LIPID PANEL
LDL Cholesterol: 56 mg/dL (ref 0–99)
Total CHOL/HDL Ratio: 3.1 RATIO
VLDL: 23 mg/dL (ref 0–40)

## 2011-01-06 LAB — CARDIAC PANEL(CRET KIN+CKTOT+MB+TROPI)
CK, MB: 1.7 ng/mL (ref 0.3–4.0)
Total CK: 109 U/L (ref 7–232)
Total CK: 117 U/L (ref 7–232)
Total CK: 129 U/L (ref 7–232)
Troponin I: 0.01 ng/mL (ref 0.00–0.06)

## 2011-01-06 LAB — CK TOTAL AND CKMB (NOT AT ARMC)
CK, MB: 1.6 ng/mL (ref 0.3–4.0)
Total CK: 125 U/L (ref 7–232)

## 2011-01-06 LAB — POCT CARDIAC MARKERS

## 2011-01-06 LAB — HEPARIN LEVEL (UNFRACTIONATED): Heparin Unfractionated: 0.14 IU/mL — ABNORMAL LOW (ref 0.30–0.70)

## 2011-01-06 LAB — PROTIME-INR: Prothrombin Time: 12.5 seconds (ref 11.6–15.2)

## 2011-01-06 LAB — DIFFERENTIAL
Basophils Relative: 1 % (ref 0–1)
Eosinophils Absolute: 0.2 10*3/uL (ref 0.0–0.7)
Lymphocytes Relative: 43 % (ref 12–46)
Lymphs Abs: 1.7 10*3/uL (ref 0.7–4.0)
Neutrophils Relative %: 40 % — ABNORMAL LOW (ref 43–77)

## 2011-01-06 LAB — TROPONIN I: Troponin I: <0.01 ng/mL (ref 0.00–0.06)

## 2011-01-07 ENCOUNTER — Encounter: Payer: Self-pay | Admitting: Family Medicine

## 2011-01-07 ENCOUNTER — Ambulatory Visit (INDEPENDENT_AMBULATORY_CARE_PROVIDER_SITE_OTHER): Payer: Medicare Other | Admitting: Family Medicine

## 2011-01-07 VITALS — BP 130/70 | Temp 98.4°F | Ht 74.0 in | Wt 255.0 lb

## 2011-01-07 DIAGNOSIS — M7710 Lateral epicondylitis, unspecified elbow: Secondary | ICD-10-CM

## 2011-01-07 DIAGNOSIS — M47812 Spondylosis without myelopathy or radiculopathy, cervical region: Secondary | ICD-10-CM

## 2011-01-07 MED ORDER — HYDROCODONE-ACETAMINOPHEN 10-500 MG PO TABS: 1.0000 | ORAL_TABLET | Freq: Four times a day (QID) | ORAL | Status: DC | PRN

## 2011-01-07 NOTE — Progress Notes (Signed)
  Subjective:    Patient ID: Rodney Barnett, male    DOB: 07/23/1941, 70 y.o.   MRN: [id number]  HPI Patient seen for the following issue. He has history of osteoarthritis and especially cervical spondylosis. Previously evaluated by neurosurgery but no recommendation for surgery. He has daily pain neck moderate severity no recent radiculopathy symptoms. No numbness or weakness. Has taken hydrocodone which he uses very sparingly in the past and is requesting refill.  Left elbow pain for several weeks. No injury. No swelling. Location is lateral. Moderate severity. Has not tried icing. Takes Plavix regularly and avoids nonsteroidals.   Review of Systems  Constitutional: Negative for fever.  Respiratory: Negative for cough and shortness of breath.   Cardiovascular: Negative for chest pain, palpitations and leg swelling.  Gastrointestinal: Negative for abdominal pain.  Neurological: Negative for weakness and headaches.       Objective:   Physical Exam  Constitutional: He is oriented to person, place, and time. He appears well-developed and well-nourished.  Cardiovascular: Normal rate, regular rhythm and normal heart sounds.   No murmur heard. Pulmonary/Chest: Effort normal and breath sounds normal. He has no wheezes. He has no rales.  Musculoskeletal:       Left elbow full range of motion. No ecchymosis. No erythema or warmth. Tender lateral epicondylar region. Full range of motion.  Neurological: He is alert and oriented to person, place, and time.       Deep tendon reflexes symmetric upper extremities. Full-strength. No atrophy. Normal sensory function touch.          Assessment & Plan:  #1 left lateral epicondylitis. Trial regular icing. Corticosteroid injection if not improving. #2 cervical spondylosis. We agreed to one refill of Lortab 10/500 one every 6 hours when necessary #90 with no refill. His previous prescription for 90 tablets lasted over 8 months.

## 2011-01-07 NOTE — Patient Instructions (Signed)
Try icing L elbow 2-3 times daily.

## 2011-01-09 ENCOUNTER — Other Ambulatory Visit: Payer: Self-pay | Admitting: *Deleted

## 2011-01-09 MED ORDER — CLOPIDOGREL BISULFATE 75 MG PO TABS: 75.0000 mg | ORAL_TABLET | Freq: Every day | ORAL | Status: DC

## 2011-02-02 ENCOUNTER — Encounter: Payer: Self-pay | Admitting: Family Medicine

## 2011-02-02 ENCOUNTER — Ambulatory Visit (INDEPENDENT_AMBULATORY_CARE_PROVIDER_SITE_OTHER): Payer: Medicare Other | Admitting: Family Medicine

## 2011-02-02 DIAGNOSIS — M7710 Lateral epicondylitis, unspecified elbow: Secondary | ICD-10-CM

## 2011-02-02 NOTE — Progress Notes (Signed)
  Subjective:    Patient ID: Rodney Barnett, male    DOB: [birth year], 70 y.o.   MRN: [id number]  HPI Persistent left elbow pain. No injury. No swelling. Has tried icing without relief.  Has not take anti-inflammatories other than aspirin. Pain is left lateral elbow. Some radiation proximally. Worse with gripping. No associated weakness. No numbness. Pain is moderate. Denies neck or shoulder pain.   Review of Systems  Respiratory: Negative for cough and shortness of breath.   Cardiovascular: Negative for chest pain.  Skin: Negative for rash.  Neurological: Negative for weakness.  Hematological: Negative for adenopathy.       Objective:   Physical Exam  Constitutional: He appears well-developed and well-nourished.  Cardiovascular: Normal rate, regular rhythm and normal heart sounds.   Pulmonary/Chest: Effort normal and breath sounds normal. He has no wheezes. He has no rales.  Musculoskeletal: He exhibits no edema.       Left elbow reveals no erythema, warmth, or ecchymosis. Full range of motion. Tender lateral epicondylar region exacerbated by wrist extension against resistance          Assessment & Plan:  Left lateral epicondylitis. Discussed risk and benefits of steroid injection since he has failed conservative therapy and patient consents. Prepped lateral elbow region with Betadine and using sterile technique injected extensor carpi radialis brevis tendon with 40 mg Depo-Medrol and 1 cc plain Xylocaine.  Used 25-gauge 1 inch needle. Patient tolerated well

## 2011-02-06 ENCOUNTER — Other Ambulatory Visit: Payer: Self-pay

## 2011-05-04 ENCOUNTER — Other Ambulatory Visit: Payer: Self-pay | Admitting: *Deleted

## 2011-05-04 MED ORDER — ATORVASTATIN CALCIUM 40 MG PO TABS: 40.0000 mg | ORAL_TABLET | Freq: Every day | ORAL | Status: DC

## 2011-05-04 MED ORDER — LISINOPRIL 10 MG PO TABS: 10.0000 mg | ORAL_TABLET | Freq: Every day | ORAL | Status: DC

## 2011-05-07 ENCOUNTER — Other Ambulatory Visit: Payer: Self-pay | Admitting: Family Medicine

## 2011-05-07 NOTE — Telephone Encounter (Signed)
OK to refill once.   

## 2011-05-07 NOTE — Telephone Encounter (Signed)
Last filled on 01/2011 #90 with 0 refills

## 2011-05-08 NOTE — Telephone Encounter (Signed)
Last filled #90 with 0 refills on 01/2011

## 2011-05-10 NOTE — Telephone Encounter (Signed)
Already responded to on 05-07-11.  OK to refill

## 2011-05-11 ENCOUNTER — Telehealth: Payer: Self-pay | Admitting: Family Medicine

## 2011-05-11 NOTE — Telephone Encounter (Signed)
Last filled 01/07/11, #90 with 0 refills

## 2011-05-11 NOTE — Telephone Encounter (Signed)
Refilled in automatic E-scribe

## 2011-05-11 NOTE — Telephone Encounter (Signed)
Ok to refill once

## 2011-05-11 NOTE — Telephone Encounter (Signed)
Pt. Requesting refill on HYDROcodone-acetaminophen (LORTAB) 10-500 MG per tablet  Please send to [facility] not [facility].

## 2011-06-03 ENCOUNTER — Other Ambulatory Visit: Payer: Self-pay | Admitting: *Deleted

## 2011-06-03 MED ORDER — ISOSORBIDE MONONITRATE ER 30 MG PO TB24: 30.0000 mg | ORAL_TABLET | Freq: Every day | ORAL | Status: DC

## 2011-06-04 ENCOUNTER — Other Ambulatory Visit: Payer: Self-pay | Admitting: *Deleted

## 2011-06-04 MED ORDER — GABAPENTIN 300 MG PO CAPS: ORAL_CAPSULE | ORAL | Status: DC

## 2011-07-02 ENCOUNTER — Other Ambulatory Visit: Payer: Self-pay | Admitting: *Deleted

## 2011-07-02 MED ORDER — METOPROLOL TARTRATE 25 MG PO TABS: 25.0000 mg | ORAL_TABLET | Freq: Two times a day (BID) | ORAL | Status: DC

## 2011-07-02 MED ORDER — CLOPIDOGREL BISULFATE 75 MG PO TABS: 75.0000 mg | ORAL_TABLET | Freq: Every day | ORAL | Status: DC

## 2011-08-17 ENCOUNTER — Other Ambulatory Visit: Payer: Self-pay | Admitting: *Deleted

## 2011-08-17 MED ORDER — HYDROCODONE-ACETAMINOPHEN 10-500 MG PO TABS: 1.0000 | ORAL_TABLET | Freq: Every day | ORAL | Status: DC | PRN

## 2011-08-17 NOTE — Telephone Encounter (Signed)
Rx called in 

## 2011-08-17 NOTE — Telephone Encounter (Signed)
Get plenty of rest, Drink lots of  clear liquids, and use Tylenol or ibuprofen for fever and discomfort.    ROV tomarrow if unimproved

## 2011-08-17 NOTE — Telephone Encounter (Signed)
#  90  0 RF

## 2011-08-17 NOTE — Telephone Encounter (Signed)
A Dr Caryl Never pt, out of the office this week.  Refill request Hydrocodone 10-500, may take one tab daily prn.  Last filled on 05-07-11, #90 with 0 refills. Please advise

## 2011-09-24 ENCOUNTER — Telehealth: Payer: Self-pay | Admitting: Family Medicine

## 2011-09-24 NOTE — Telephone Encounter (Signed)
Pt is wanting to come in for blood work but not see the doctor.as what blood they were wanting to get pt said same lab work they always get? Please contact pt

## 2011-09-24 NOTE — Telephone Encounter (Signed)
Pt informed its been over a year that he has had a CPX.  i convinced him to schedule CPX and labs will be done same day

## 2011-09-30 ENCOUNTER — Other Ambulatory Visit: Payer: Self-pay | Admitting: Cardiology

## 2011-10-28 ENCOUNTER — Encounter: Payer: Self-pay | Admitting: Family Medicine

## 2011-10-28 ENCOUNTER — Ambulatory Visit (INDEPENDENT_AMBULATORY_CARE_PROVIDER_SITE_OTHER): Payer: Medicare Other | Admitting: Family Medicine

## 2011-10-28 VITALS — BP 142/72 | HR 72 | Temp 98.2°F | Resp 12 | Ht 74.5 in | Wt 262.0 lb

## 2011-10-28 DIAGNOSIS — Z0000 Encounter for general adult medical examination without abnormal findings: Secondary | ICD-10-CM

## 2011-10-28 DIAGNOSIS — I2510 Atherosclerotic heart disease of native coronary artery without angina pectoris: Secondary | ICD-10-CM

## 2011-10-28 DIAGNOSIS — Z125 Encounter for screening for malignant neoplasm of prostate: Secondary | ICD-10-CM

## 2011-10-28 DIAGNOSIS — I10 Essential (primary) hypertension: Secondary | ICD-10-CM

## 2011-10-28 DIAGNOSIS — E785 Hyperlipidemia, unspecified: Secondary | ICD-10-CM

## 2011-10-28 LAB — HEPATIC FUNCTION PANEL
Albumin: 3.9 g/dL (ref 3.5–5.2)
Alkaline Phosphatase: 68 U/L (ref 39–117)
Total Protein: 6.9 g/dL (ref 6.0–8.3)

## 2011-10-28 LAB — LIPID PANEL
Cholesterol: 225 mg/dL — ABNORMAL HIGH (ref 0–200)
HDL: 33.6 mg/dL — ABNORMAL LOW (ref >39.00)
VLDL: 47.6 mg/dL — ABNORMAL HIGH (ref 0.0–40.0)

## 2011-10-28 LAB — BASIC METABOLIC PANEL
BUN: 14 mg/dL (ref 6–23)
GFR: 83.44 mL/min (ref >60.00)
Potassium: 4.9 mEq/L (ref 3.5–5.1)
Sodium: 141 mEq/L (ref 135–145)

## 2011-10-28 MED ORDER — METOPROLOL TARTRATE 25 MG PO TABS: 25.0000 mg | ORAL_TABLET | Freq: Two times a day (BID) | ORAL | Status: DC

## 2011-10-28 MED ORDER — HYDROCODONE-ACETAMINOPHEN 10-500 MG PO TABS: 1.0000 | ORAL_TABLET | Freq: Every day | ORAL | Status: DC | PRN

## 2011-10-28 MED ORDER — PRAVASTATIN SODIUM 40 MG PO TABS: 40.0000 mg | ORAL_TABLET | Freq: Every day | ORAL | Status: DC

## 2011-10-28 NOTE — Progress Notes (Signed)
Subjective:    Patient ID: Rodney Barnett, male    DOB: [birth year], 71 y.o.   MRN: [id number]  HPI Patient here for medical followup and Medicare wellness exam. He has history of CAD with prior bypass, hypertension, hyperlipidemia, erectile dysfunction and some chronic intermittent insomnia. He has some chronic back pain currently managed with Lortab 10 mg each bedtime. Avoid daytime use.  Recent problem left shoulder pain. After stopping Lipitor his pain went almost fully away. He reinitiated Lipitor and had recurrence of left shoulder pain. He is very reluctant to go back on Lipitor this time. No recent chest pains. Compliant with other medications.  1.  Risk factors based on Past Medical , Social, and Family history reviewed as below. 2.  Limitations in physical activities none 3.  Depression/mood no depression or anxiety. 4.  Hearing No major deficits 5.  ADLs independent in all. 6.  Cognitive function (orientation to time and place, language, writing, speech,memory) no memory deficits.  Judgement intact. Language intact. 7.  Home Safety no issues 8.  Height, weight, and visual acuity. No significant ht or wt changes. Vision stable 9.  Counseling exercise and weight control. 10. Recommendation of preventive services. Vaccines as below. 11. Labs based on risk factors Lipid, hepatic, PSA 12. Care Plan as above.  Past Medical History  Diagnosis Date  . LIPOMA 11/15/2009  . HYPERLIPIDEMIA 12/27/2008  . ERECTILE DYSFUNCTION 12/27/2008  . HYPERTENSION 12/27/2008  . CORONARY ATHEROSCLEROSIS NATIVE CORONARY ARTERY 10/07/2009  . RHINITIS 11/15/2009  . INSOMNIA 04/24/2010   Past Surgical History  Procedure Date  . Coronary artery bypass graft 1994    LIMA to LAD, SVG to RCA, SVG to diagnol    reports that he quit smoking about 18 years ago. His smoking use included Cigarettes. He has a 120 pack-year smoking history. He does not have any smokeless tobacco history on file. His alcohol and drug  histories not on file. family history includes Hypertension in his other. No Known Allergies    Review of Systems  Constitutional: Negative for fever, activity change, appetite change and fatigue.  HENT: Negative for ear pain, congestion and trouble swallowing.   Eyes: Negative for pain and visual disturbance.  Respiratory: Negative for cough, shortness of breath and wheezing.   Cardiovascular: Negative for chest pain and palpitations.  Gastrointestinal: Negative for nausea, vomiting, abdominal pain, diarrhea, constipation, blood in stool, abdominal distention and rectal pain.  Genitourinary: Negative for dysuria, hematuria and testicular pain.  Musculoskeletal: Negative for joint swelling and arthralgias.  Skin: Negative for rash.  Neurological: Negative for dizziness, syncope and headaches.  Hematological: Negative for adenopathy.  Psychiatric/Behavioral: Negative for confusion and dysphoric mood.       Objective:   Physical Exam  Constitutional: He is oriented to person, place, and time. He appears well-developed and well-nourished. No distress.  HENT:  Head: Normocephalic and atraumatic.  Right Ear: External ear normal.  Left Ear: External ear normal.  Mouth/Throat: Oropharynx is clear and moist.  Eyes: Conjunctivae and EOM are normal. Pupils are equal, round, and reactive to light.  Neck: Normal range of motion. Neck supple. No thyromegaly present.  Cardiovascular: Normal rate, regular rhythm and normal heart sounds.   No murmur heard. Pulmonary/Chest: No respiratory distress. He has no wheezes. He has no rales.  Abdominal: Soft. Bowel sounds are normal. He exhibits no distension and no mass. There is no tenderness. There is no rebound and no guarding.  Genitourinary: Rectum normal and prostate normal.  Musculoskeletal:  He exhibits no edema.  Lymphadenopathy:    He has no cervical adenopathy.  Neurological: He is alert and oriented to person, place, and time. He displays  normal reflexes. No cranial nerve deficit.  Skin: No rash noted.  Psychiatric: He has a normal mood and affect.          Assessment & Plan:  #1 health maintenance. Patient refuses flu vaccine. Pneumovax up-to-date. Discussed shingles vaccine. #2 history of CAD. Needs repeat lipid panel. Needs to reestablish with cardiologist.  #3 ex-smoker. Given age of 35 screening for asymptomatic abdominal aortic aneurysm with ultrasound #4 hyperlipidemia. Possible adverse reaction to Lipitor. Switch to Pravachol 40 mg each bedtime and reassess 2-3 months #5 hypertension stable continue current medications #6 chronic low back pain secondary to arthritis. Refill Lortab for limited use #90 with no refill

## 2011-10-29 ENCOUNTER — Other Ambulatory Visit: Payer: Self-pay | Admitting: Family Medicine

## 2011-10-29 DIAGNOSIS — E785 Hyperlipidemia, unspecified: Secondary | ICD-10-CM

## 2011-10-29 NOTE — Progress Notes (Signed)
Quick Note:  Pt wife informed, future labs ordered, wife aware ______

## 2011-10-31 ENCOUNTER — Other Ambulatory Visit: Payer: Self-pay | Admitting: Cardiology

## 2011-11-03 ENCOUNTER — Other Ambulatory Visit: Payer: Self-pay | Admitting: *Deleted

## 2011-11-04 ENCOUNTER — Ambulatory Visit (HOSPITAL_COMMUNITY): Payer: Medicare Other

## 2011-11-04 ENCOUNTER — Ambulatory Visit: Payer: Medicare Other | Admitting: Cardiology

## 2011-11-04 ENCOUNTER — Ambulatory Visit (INDEPENDENT_AMBULATORY_CARE_PROVIDER_SITE_OTHER): Payer: Medicare Other | Admitting: Cardiology

## 2011-11-04 ENCOUNTER — Encounter: Payer: Self-pay | Admitting: Cardiology

## 2011-11-04 VITALS — BP 138/80 | HR 70 | Resp 16 | Ht 76.0 in | Wt 259.0 lb

## 2011-11-04 DIAGNOSIS — E785 Hyperlipidemia, unspecified: Secondary | ICD-10-CM

## 2011-11-04 DIAGNOSIS — I2510 Atherosclerotic heart disease of native coronary artery without angina pectoris: Secondary | ICD-10-CM

## 2011-11-04 DIAGNOSIS — I10 Essential (primary) hypertension: Secondary | ICD-10-CM

## 2011-11-04 MED ORDER — LISINOPRIL 10 MG PO TABS: 10.0000 mg | ORAL_TABLET | Freq: Every day | ORAL | Status: DC

## 2011-11-04 NOTE — Patient Instructions (Signed)
Your physician recommends that you schedule a follow-up appointment in: 1 year  

## 2011-11-04 NOTE — Progress Notes (Signed)
   Clinical Summary Mr. Gelpi is a 71 y.o.male presenting for followup. He was seen in August 2011, referred back by Dr. Caryl Never after recent physical. He reports no angina or progressive dyspnea on exertion. He is due for some medication refills.  Cardiac catheterization results from 2010 were reviewed. ECG noted below. We discussed warning signs and plan for observation at this point.  He denies any bleeding problems. Switch recently made from Lipitor to Pravachol secondary to intolerance. He is to have followup for this in March.   No Known Allergies  Current Outpatient Prescriptions  Medication Sig Dispense Refill  . aspirin 81 MG tablet Take 81 mg by mouth daily.        Marland Kitchen gabapentin (NEURONTIN) 300 MG capsule 2 tabs by mouth twice daily  120 capsule  5  . HYDROcodone-acetaminophen (LORTAB) 10-500 MG per tablet Take 1 tablet by mouth daily as needed for pain.  90 tablet  0  . isosorbide mononitrate (IMDUR) 30 MG 24 hr tablet Take 1 tablet (30 mg total) by mouth daily.  30 tablet  6  . lisinopril (PRINIVIL,ZESTRIL) 10 MG tablet Take 1 tablet (10 mg total) by mouth daily.  30 tablet  11  . metoprolol tartrate (LOPRESSOR) 25 MG tablet Take 1 tablet (25 mg total) by mouth 2 (two) times daily.  60 tablet  11  . nitroGLYCERIN (NITROSTAT) 0.4 MG SL tablet Place 0.4 mg under the tongue every 5 (five) minutes as needed.        Marland Kitchen PLAVIX 75 MG tablet TAKE 1 TABLET ONCE DAILY.  30 each  1  . pravastatin (PRAVACHOL) 40 MG tablet Take 1 tablet (40 mg total) by mouth daily.  30 tablet  5    Past Medical History  Diagnosis Date  . LIPOMA 11/15/2009  . Mixed hyperlipidemia 12/27/2008  . ERECTILE DYSFUNCTION 12/27/2008  . Essential hypertension, benign 12/27/2008  . Coronary atherosclerosis of native coronary artery 10/07/2009    Multivessel, LVEF 55-60%, basal inferior hypokinesis, known graft disease  . RHINITIS 11/15/2009  . INSOMNIA 04/24/2010  . Chronic back pain   . Peripheral neuropathy      Past Surgical History  Procedure Date  . Coronary artery bypass graft 1994    LIMA to LAD, SVG to RCA, SVG to diagonal    Family History  Problem Relation Age of Onset  . Hypertension Other     Social History Mr. Bottino reports that he quit smoking about 18 years ago. His smoking use included Cigarettes. He has a 120 pack-year smoking history. He does not have any smokeless tobacco history on file. Mr. Mandala reports that he does not drink alcohol.  Review of Systems No palpitations or syncope. Stable appetite. No melena or hematochezia. Was reporting arm pain with Lipitor. Otherwise negative.  Physical Examination Filed Vitals:   11/04/11 0909  BP: 138/80  Pulse: 70  Resp: 16   Overweight male in no acute distress.  HEENT: Conjunctiva and lids normal, oropharynx clear. Neck: Supple, no elevated jugular venous pressure, no thyromegaly.  Lungs: Diminished breath sounds, nonlabored.  Cardiac: Regular rate and rhythm, no S3 gallop. Soft S4.  Abdomen: Soft, nontender, bowel sounds present. No bruit. Extremities: Trace ankle edema. Distal pulses 1-2+.  Skin: Warm and dry.  Musculoskeletal: No kyphosis.  Neuropsychiatric: Alert and oriented x3, affect appropriate.   ECG Sinus rhythm with left axis, NSST changes.   Problem List and Plan

## 2011-11-04 NOTE — Assessment & Plan Note (Signed)
Recent switch from Lipitor to Pravachol. To followup with Dr. Caryl Never in March. Aim for LDL under 100.

## 2011-11-04 NOTE — Assessment & Plan Note (Signed)
Medications reviewed. No changes made. Diet and exercise discussed.

## 2011-11-04 NOTE — Assessment & Plan Note (Signed)
Symptomatically stable on medical therapy. ECG reviewed. We discussed warning signs and will continue observation for now. Followup arranged.

## 2011-12-15 ENCOUNTER — Other Ambulatory Visit (INDEPENDENT_AMBULATORY_CARE_PROVIDER_SITE_OTHER): Payer: Medicare Other

## 2011-12-15 DIAGNOSIS — E785 Hyperlipidemia, unspecified: Secondary | ICD-10-CM

## 2011-12-15 LAB — LIPID PANEL
Cholesterol: 175 mg/dL (ref 0–200)
Total CHOL/HDL Ratio: 5
Triglycerides: 231 mg/dL — ABNORMAL HIGH (ref 0.0–149.0)

## 2011-12-15 LAB — HEPATIC FUNCTION PANEL
ALT: 28 U/L (ref 0–53)
Albumin: 4 g/dL (ref 3.5–5.2)
Total Bilirubin: 0.6 mg/dL (ref 0.3–1.2)

## 2011-12-18 ENCOUNTER — Telehealth: Payer: Self-pay | Admitting: Family Medicine

## 2011-12-18 NOTE — Telephone Encounter (Signed)
Labs were all stable but we cannot discuss with wife unless she is on his HIPPA.

## 2011-12-18 NOTE — Telephone Encounter (Signed)
Patient's wife called stating that she would like to have the results of his labs done on Tuesday. Please assist.

## 2011-12-22 NOTE — Telephone Encounter (Signed)
Rodney Barnett please call pt will lab results

## 2011-12-22 NOTE — Telephone Encounter (Signed)
I called pt home, husband informed we need DPR in his chart to be able to discuss with wife.  I mailed DPR and labs to pt home, he can return form to our office

## 2011-12-28 ENCOUNTER — Other Ambulatory Visit: Payer: Self-pay | Admitting: Cardiology

## 2012-01-26 ENCOUNTER — Ambulatory Visit: Payer: Medicare Other | Admitting: Family Medicine

## 2012-01-28 ENCOUNTER — Other Ambulatory Visit: Payer: Self-pay | Admitting: Cardiology

## 2012-02-11 ENCOUNTER — Other Ambulatory Visit: Payer: Self-pay | Admitting: *Deleted

## 2012-02-11 NOTE — Telephone Encounter (Signed)
Refill once 

## 2012-02-11 NOTE — Telephone Encounter (Signed)
Hydrocodone last filled at [facility] 10-28-11, #90 with 0 refills.  Pt has cancelled 3 month F/U 3 times

## 2012-02-12 MED ORDER — HYDROCODONE-ACETAMINOPHEN 10-500 MG PO TABS: 1.0000 | ORAL_TABLET | Freq: Every day | ORAL | Status: DC | PRN

## 2012-02-22 ENCOUNTER — Ambulatory Visit: Payer: Medicare Other | Admitting: Family Medicine

## 2012-02-24 ENCOUNTER — Ambulatory Visit: Payer: Medicare Other | Admitting: Family Medicine

## 2012-04-26 ENCOUNTER — Other Ambulatory Visit: Payer: Self-pay | Admitting: Family Medicine

## 2012-04-28 ENCOUNTER — Other Ambulatory Visit: Payer: Self-pay | Admitting: *Deleted

## 2012-04-28 MED ORDER — ATORVASTATIN CALCIUM 40 MG PO TABS: 40.0000 mg | ORAL_TABLET | Freq: Every day | ORAL | Status: DC

## 2012-05-14 ENCOUNTER — Other Ambulatory Visit: Payer: Self-pay | Admitting: Family Medicine

## 2012-05-16 NOTE — Telephone Encounter (Signed)
Last OV 10-28-11, has cancelled 3 month F/U three times  Lortab last filled 02-12-12, #90 with 0 refills

## 2012-05-17 NOTE — Telephone Encounter (Signed)
Refill once.  Pt needs to follow up before further refills.

## 2012-05-26 ENCOUNTER — Other Ambulatory Visit: Payer: Self-pay | Admitting: Family Medicine

## 2012-05-30 ENCOUNTER — Other Ambulatory Visit: Payer: Self-pay | Admitting: *Deleted

## 2012-05-30 MED ORDER — GABAPENTIN 300 MG PO CAPS: 300.0000 mg | ORAL_CAPSULE | Freq: Two times a day (BID) | ORAL | Status: DC

## 2012-05-30 MED ORDER — LISINOPRIL 10 MG PO TABS: 10.0000 mg | ORAL_TABLET | Freq: Every day | ORAL | Status: DC

## 2012-06-07 ENCOUNTER — Encounter: Payer: Self-pay | Admitting: Family Medicine

## 2012-06-07 ENCOUNTER — Ambulatory Visit (INDEPENDENT_AMBULATORY_CARE_PROVIDER_SITE_OTHER): Payer: Medicare Other | Admitting: Family Medicine

## 2012-06-07 VITALS — BP 130/78 | Temp 97.9°F | Wt 265.0 lb

## 2012-06-07 DIAGNOSIS — G629 Polyneuropathy, unspecified: Secondary | ICD-10-CM

## 2012-06-07 DIAGNOSIS — E785 Hyperlipidemia, unspecified: Secondary | ICD-10-CM

## 2012-06-07 DIAGNOSIS — I2510 Atherosclerotic heart disease of native coronary artery without angina pectoris: Secondary | ICD-10-CM

## 2012-06-07 DIAGNOSIS — I10 Essential (primary) hypertension: Secondary | ICD-10-CM

## 2012-06-07 DIAGNOSIS — G609 Hereditary and idiopathic neuropathy, unspecified: Secondary | ICD-10-CM

## 2012-06-07 LAB — LIPID PANEL
HDL: 36.1 mg/dL — ABNORMAL LOW (ref >39.00)
Total CHOL/HDL Ratio: 5
Triglycerides: 221 mg/dL — ABNORMAL HIGH (ref 0.0–149.0)
VLDL: 44.2 mg/dL — ABNORMAL HIGH (ref 0.0–40.0)

## 2012-06-07 LAB — LDL CHOLESTEROL, DIRECT: Direct LDL: 114.8 mg/dL

## 2012-06-07 LAB — HEPATIC FUNCTION PANEL
ALT: 22 U/L (ref 0–53)
Bilirubin, Direct: 0.1 mg/dL (ref 0.0–0.3)
Total Bilirubin: 0.8 mg/dL (ref 0.3–1.2)

## 2012-06-07 MED ORDER — GABAPENTIN 300 MG PO CAPS: ORAL_CAPSULE | ORAL | Status: DC

## 2012-06-07 MED ORDER — NITROGLYCERIN 0.4 MG SL SUBL: 0.4000 mg | SUBLINGUAL_TABLET | SUBLINGUAL | Status: DC | PRN

## 2012-06-07 NOTE — Progress Notes (Signed)
  Subjective:    Patient ID: Rodney Barnett, male    DOB: Apr 27, 1941, 71 y.o.   MRN: [id number]  HPI  Patient has history of coronary artery disease, hypertension, hyperlipidemia and bilateral peripheral neuropathy. Evaluated at [facility] few years ago and started on gabapentin. No history of diabetes. Idiopathic. Symptoms are bilateral. Worse in the evening. Burning and stinging sensation mostly involving the feet. Currently taking gabapentin 300 mg 2 tablets twice daily but still has breakthrough symptoms. No claudication symptoms.  History of CAD. No recent chest pain. Has sublingual nitroglycerin which has not taken any years over 43 years old. Needs refills. Recently transitioned from Lipitor to pravastatin and he is tolerated well without side effects. Requesting repeat lipids. Hypertension well controlled by home readings. 130s systolic. No dizziness. No cough.  Past Medical History  Diagnosis Date  . LIPOMA 11/15/2009  . Mixed hyperlipidemia 12/27/2008  . ERECTILE DYSFUNCTION 12/27/2008  . Essential hypertension, benign 12/27/2008  . Coronary atherosclerosis of native coronary artery 10/07/2009    Multivessel, LVEF 55-60%, basal inferior hypokinesis, known graft disease  . RHINITIS 11/15/2009  . INSOMNIA 04/24/2010  . Chronic back pain   . Peripheral neuropathy    Past Surgical History  Procedure Date  . Coronary artery bypass graft 1994    LIMA to LAD, SVG to RCA, SVG to diagonal    reports that he quit smoking about 19 years ago. His smoking use included Cigarettes. He has a 120 pack-year smoking history. He does not have any smokeless tobacco history on file. He reports that he does not drink alcohol or use illicit drugs. family history includes Hypertension in his other. No Known Allergies    Review of Systems  Constitutional: Negative for fatigue.  Eyes: Negative for visual disturbance.  Respiratory: Negative for cough, chest tightness and shortness of breath.     Cardiovascular: Negative for chest pain, palpitations and leg swelling.  Musculoskeletal: Negative for myalgias and arthralgias.  Neurological: Negative for dizziness, syncope, weakness, light-headedness and headaches.       Objective:   Physical Exam  Constitutional: He is oriented to person, place, and time. He appears well-developed and well-nourished.  Neck: Neck supple. No thyromegaly present.  Cardiovascular: Normal rate and regular rhythm.   Pulmonary/Chest: Effort normal and breath sounds normal. No respiratory distress. He has no wheezes. He has no rales.  Musculoskeletal: He exhibits no edema.  Neurological: He is alert and oriented to person, place, and time.          Assessment & Plan:  #1 bilateral peripheral neuropathy. No history of diabetes. Poorly controlled. Titrate gabapentin 300 mg 2 tablets 3 times a day #2 history of CAD. Symptomatically stable. Continue close followup with cardiology.  Refilled sublingual nitroglycerin for as needed use #3 hypertension. Stable. Continue current medications  #4 hyperlipidemia. Recheck lipid and hepatic panel

## 2012-06-08 ENCOUNTER — Other Ambulatory Visit: Payer: Self-pay | Admitting: *Deleted

## 2012-06-08 DIAGNOSIS — E785 Hyperlipidemia, unspecified: Secondary | ICD-10-CM

## 2012-06-08 NOTE — Progress Notes (Signed)
Quick Note:  Wife informed, labs mailed to home with instructions highlighted, also low ______

## 2012-06-24 ENCOUNTER — Other Ambulatory Visit: Payer: Self-pay | Admitting: *Deleted

## 2012-06-24 NOTE — Telephone Encounter (Signed)
Med already filled at [facility]

## 2012-08-15 ENCOUNTER — Other Ambulatory Visit: Payer: Self-pay | Admitting: Family Medicine

## 2012-08-16 NOTE — Telephone Encounter (Signed)
Last filled 05-14-12, #90 with 0 refills

## 2012-08-16 NOTE — Telephone Encounter (Signed)
Refill #60 once only.  Avoid regular use.

## 2012-09-23 ENCOUNTER — Other Ambulatory Visit: Payer: Self-pay | Admitting: Family Medicine

## 2012-10-24 ENCOUNTER — Other Ambulatory Visit: Payer: Self-pay | Admitting: Cardiology

## 2012-10-24 MED ORDER — ISOSORBIDE MONONITRATE ER 30 MG PO TB24: 30.0000 mg | ORAL_TABLET | Freq: Every day | ORAL | Status: DC

## 2012-10-26 ENCOUNTER — Encounter: Payer: Self-pay | Admitting: Cardiology

## 2012-10-26 ENCOUNTER — Ambulatory Visit (INDEPENDENT_AMBULATORY_CARE_PROVIDER_SITE_OTHER): Payer: Medicare Other | Admitting: Cardiology

## 2012-10-26 VITALS — BP 147/77 | HR 55 | Ht 76.0 in | Wt 258.5 lb

## 2012-10-26 DIAGNOSIS — E785 Hyperlipidemia, unspecified: Secondary | ICD-10-CM

## 2012-10-26 DIAGNOSIS — I2510 Atherosclerotic heart disease of native coronary artery without angina pectoris: Secondary | ICD-10-CM

## 2012-10-26 MED ORDER — PRAVASTATIN SODIUM 40 MG PO TABS: 40.0000 mg | ORAL_TABLET | Freq: Every day | ORAL | Status: DC

## 2012-10-26 MED ORDER — LISINOPRIL 10 MG PO TABS: 10.0000 mg | ORAL_TABLET | Freq: Every day | ORAL | Status: DC

## 2012-10-26 MED ORDER — ISOSORBIDE MONONITRATE ER 30 MG PO TB24: 30.0000 mg | ORAL_TABLET | Freq: Every day | ORAL | Status: DC

## 2012-10-26 MED ORDER — METOPROLOL TARTRATE 25 MG PO TABS: 25.0000 mg | ORAL_TABLET | Freq: Two times a day (BID) | ORAL | Status: DC

## 2012-10-26 MED ORDER — CLOPIDOGREL BISULFATE 75 MG PO TABS: 75.0000 mg | ORAL_TABLET | Freq: Every day | ORAL | Status: DC

## 2012-10-26 NOTE — Assessment & Plan Note (Signed)
Symptomatically stable on medical therapy. No changes made today with refills given. If he were to need back surgery with Dr. Channing Mutters, a followup Myoview would be considered.

## 2012-10-26 NOTE — Patient Instructions (Addendum)
Your physician recommends that you schedule a follow-up appointment in: 1 year  

## 2012-10-26 NOTE — Assessment & Plan Note (Signed)
Keep followup with Dr. Caryl Never. LDL noted in September with followup pending. He is tolerating Pravachol.

## 2012-10-26 NOTE — Progress Notes (Signed)
Clinical Summary Rodney Barnett is a 71 y.o.male presenting for followup. He was seen in January 2013. He reports no regular angina symptoms. He reports compliance with his medications and needs refills on his cardiac regimen. Main limitation is intermittent lower back pain - states that he has disc problems and will be seeing Rodney Barnett in consultation.  Lab work from September 2013 showed cholesterol 174, triglycerides 221, HDL 36, LDL 114, normal LFTs. He has tolerated Pravachol so far.  Cardiac catheterization in 2010 showed occluded SVG to RCA and SVG to diagonal, although patent LIMA to LAD. The RCA was occluded with left to right collaterals and the circumflex was nonobstructive. He has been managed medically.  No Known Allergies  Current Outpatient Prescriptions  Medication Sig Dispense Refill  . aspirin 81 MG tablet Take 81 mg by mouth daily.        . clopidogrel (PLAVIX) 75 MG tablet Take 1 tablet (75 mg total) by mouth daily.  90 tablet  3  . gabapentin (NEURONTIN) 300 MG capsule Take 2 tablets three times daily  540 capsule  3  . HYDROcodone-acetaminophen (LORTAB 10) 10-500 MG per tablet Take 1 tablet by mouth as needed for pain.  60 tablet  0  . isosorbide mononitrate (IMDUR) 30 MG 24 hr tablet Take 1 tablet (30 mg total) by mouth daily.  30 tablet  3  . lisinopril (PRINIVIL,ZESTRIL) 10 MG tablet Take 1 tablet (10 mg total) by mouth daily.  90 tablet  3  . metoprolol tartrate (LOPRESSOR) 25 MG tablet Take 1 tablet (25 mg total) by mouth 2 (two) times daily.  180 tablet  3  . nitroGLYCERIN (NITROSTAT) 0.4 MG SL tablet Place 1 tablet (0.4 mg total) under the tongue every 5 (five) minutes as needed.  30 tablet  0  . pravastatin (PRAVACHOL) 40 MG tablet Take 1 tablet (40 mg total) by mouth daily.  90 tablet  3    Past Medical History  Diagnosis Date  . LIPOMA 11/15/2009  . Mixed hyperlipidemia 12/27/2008  . ERECTILE DYSFUNCTION 12/27/2008  . Essential hypertension, benign 12/27/2008  .  Coronary atherosclerosis of native coronary artery 10/07/2009    Multivessel, LVEF 55-60%, basal inferior hypokinesis, known graft disease  . RHINITIS 11/15/2009  . INSOMNIA 04/24/2010  . Chronic back pain   . Peripheral neuropathy     Social History [name] reports that he quit smoking about 19 years ago. His smoking use included Cigarettes. He has a 120 pack-year smoking history. He does not have any smokeless tobacco history on file. [name] reports that he does not drink alcohol.  Review of Systems No palpitations or syncope. Otherwise as outlined above.  Physical Examination Filed Vitals:   10/26/12 1403  BP: 147/77  Pulse: 55   Filed Weights   10/26/12 1403  Weight: 258 lb 8 oz (117.255 kg)    Overweight male in no acute distress.  HEENT: Conjunctiva and lids normal, oropharynx clear.  Neck: Supple, no elevated jugular venous pressure, no thyromegaly.  Lungs: Diminished breath sounds, nonlabored.  Cardiac: Regular rate and rhythm, no S3 gallop. Soft S4.  Abdomen: Soft, nontender, bowel sounds present. No bruit.  Extremities: Trace ankle edema. Distal pulses 1-2+.  Skin: Warm and dry.  Musculoskeletal: No kyphosis.  Neuropsychiatric: Alert and oriented x3, affect appropriate.   Problem List and Plan   CORONARY ATHEROSCLEROSIS NATIVE CORONARY ARTERY Symptomatically stable on medical therapy. No changes made today with refills given. If he were to need  back surgery with Rodney Barnett, a followup Myoview would be considered.  HYPERLIPIDEMIA Keep followup with Rodney Barnett. LDL noted in September with followup pending. He is tolerating Pravachol.    Jonelle Sidle, M.D., F.A.C.C.

## 2012-11-04 ENCOUNTER — Telehealth: Payer: Self-pay | Admitting: Family Medicine

## 2012-11-04 NOTE — Telephone Encounter (Signed)
Wife calling to get the last prostate test results.  Wife notified per Epic that result was normal.

## 2012-11-22 ENCOUNTER — Other Ambulatory Visit: Payer: Self-pay | Admitting: Cardiology

## 2012-11-22 ENCOUNTER — Telehealth: Payer: Self-pay | Admitting: *Deleted

## 2012-11-22 MED ORDER — LISINOPRIL 10 MG PO TABS: 10.0000 mg | ORAL_TABLET | Freq: Every day | ORAL | Status: DC

## 2012-11-22 NOTE — Telephone Encounter (Signed)
Refill once 

## 2012-11-22 NOTE — Telephone Encounter (Signed)
Hydrocodone 10-500 prn pain last filled 08-15-12, #60 with 0 refills

## 2012-11-23 ENCOUNTER — Other Ambulatory Visit: Payer: Self-pay | Admitting: *Deleted

## 2012-11-23 MED ORDER — HYDROCODONE-ACETAMINOPHEN 10-325 MG PO TABS: 1.0000 | ORAL_TABLET | Freq: Three times a day (TID) | ORAL | Status: DC | PRN

## 2012-11-23 MED ORDER — HYDROCODONE-ACETAMINOPHEN 10-500 MG PO TABS: 1.0000 | ORAL_TABLET | ORAL | Status: DC | PRN

## 2012-12-05 ENCOUNTER — Encounter: Payer: Self-pay | Admitting: Family Medicine

## 2012-12-05 ENCOUNTER — Ambulatory Visit (INDEPENDENT_AMBULATORY_CARE_PROVIDER_SITE_OTHER): Payer: Medicare Other | Admitting: Family Medicine

## 2012-12-05 VITALS — BP 102/70 | Temp 97.7°F | Wt 255.0 lb

## 2012-12-05 DIAGNOSIS — G629 Polyneuropathy, unspecified: Secondary | ICD-10-CM

## 2012-12-05 DIAGNOSIS — E785 Hyperlipidemia, unspecified: Secondary | ICD-10-CM

## 2012-12-05 DIAGNOSIS — Z8042 Family history of malignant neoplasm of prostate: Secondary | ICD-10-CM

## 2012-12-05 DIAGNOSIS — I10 Essential (primary) hypertension: Secondary | ICD-10-CM

## 2012-12-05 DIAGNOSIS — I2510 Atherosclerotic heart disease of native coronary artery without angina pectoris: Secondary | ICD-10-CM

## 2012-12-05 DIAGNOSIS — G609 Hereditary and idiopathic neuropathy, unspecified: Secondary | ICD-10-CM

## 2012-12-05 LAB — LIPID PANEL
HDL: 34 mg/dL — ABNORMAL LOW (ref >39.00)
LDL Cholesterol: 121 mg/dL — ABNORMAL HIGH (ref 0–99)
Total CHOL/HDL Ratio: 6
Triglycerides: 184 mg/dL — ABNORMAL HIGH (ref 0.0–149.0)

## 2012-12-05 LAB — BASIC METABOLIC PANEL
CO2: 26 mEq/L (ref 19–32)
Chloride: 103 mEq/L (ref 96–112)
Creatinine, Ser: 1 mg/dL (ref 0.4–1.5)
Glucose, Bld: 113 mg/dL — ABNORMAL HIGH (ref 70–99)
Sodium: 140 mEq/L (ref 135–145)

## 2012-12-05 LAB — PSA: PSA: 1.11 ng/mL (ref 0.10–4.00)

## 2012-12-05 NOTE — Progress Notes (Signed)
  Subjective:    Patient ID: Rodney Barnett, male    DOB: [birth year], 72 y.o.   MRN: [id number]  HPI Medical followup.  Chronic problems include history of CAD, hypertension, hyperlipidemia, peripheral neuropathy, and chronic low back pain. Followed by a neurosurgeon. Planned epidural injections soon.  Patient compliant with medications. No recent chest pains. Last LDL 114. He takes Plavix. Blood pressures been well controlled. No dizziness. No headaches.  Family history of prostate cancer. Brother recently diagnosed. Patient has no obstructive urinary symptoms. Requesting PSA.  Peripheral neuropathy controlled with gabapentin 300 mg 2 twice daily. No history of diabetes but has had impaired glucose tolerance  Past Medical History  Diagnosis Date  . LIPOMA 11/15/2009  . Mixed hyperlipidemia 12/27/2008  . ERECTILE DYSFUNCTION 12/27/2008  . Essential hypertension, benign 12/27/2008  . Coronary atherosclerosis of native coronary artery 10/07/2009    Multivessel, LVEF 55-60%, basal inferior hypokinesis, known graft disease  . RHINITIS 11/15/2009  . INSOMNIA 04/24/2010  . Chronic back pain   . Peripheral neuropathy    Past Surgical History  Procedure Laterality Date  . Coronary artery bypass graft  1994    LIMA to LAD, SVG to RCA, SVG to diagonal    reports that he quit smoking about 19 years ago. His smoking use included Cigarettes. He has a 120 pack-year smoking history. He does not have any smokeless tobacco history on file. He reports that he does not drink alcohol or use illicit drugs. family history includes Hypertension in his other. No Known Allergies    Review of Systems  Constitutional: Negative for fatigue and unexpected weight change.  Eyes: Negative for visual disturbance.  Respiratory: Negative for cough, chest tightness and shortness of breath.   Cardiovascular: Negative for chest pain, palpitations and leg swelling.  Gastrointestinal: Negative for abdominal pain.   Musculoskeletal: Positive for back pain.  Skin: Negative for rash.  Neurological: Negative for dizziness, syncope, weakness, light-headedness and headaches.       Objective:   Physical Exam  Constitutional: He appears well-developed and well-nourished.  HENT:  Mouth/Throat: Oropharynx is clear and moist.  Neck: Neck supple. No thyromegaly present.  Cardiovascular: Normal rate and regular rhythm.   Pulmonary/Chest: Effort normal and breath sounds normal. No respiratory distress. He has no wheezes. He has no rales.  Genitourinary:  Anal sphincter slightly decreased tone. Normal size prostate. No asymmetry. No rectal mass  Musculoskeletal: He exhibits no edema.          Assessment & Plan:  #1 history of CAD. Recheck lipid panel.  #2 positive family history prostate cancer. Check PSA #3 hypertension well controlled  #4 history of peripheral neuropathy which is stable on gabapentin

## 2012-12-06 ENCOUNTER — Other Ambulatory Visit: Payer: Self-pay | Admitting: *Deleted

## 2012-12-06 MED ORDER — PRAVASTATIN SODIUM 80 MG PO TABS: 80.0000 mg | ORAL_TABLET | Freq: Every day | ORAL | Status: DC

## 2012-12-06 NOTE — Progress Notes (Signed)
Quick Note:  Pt wife Darel Hong informed ______

## 2013-01-02 ENCOUNTER — Telehealth: Payer: Self-pay | Admitting: Family Medicine

## 2013-01-02 NOTE — Telephone Encounter (Signed)
As written on lab result patient should take 80 mg daily.  Spoke with wife.

## 2013-01-02 NOTE — Telephone Encounter (Signed)
Patient Information:  Caller Name: Darel Hong  Phone: [phone]  Patient: Rodney Barnett, Rodney Barnett  Gender: Male  DOB: 1941/09/18  Age: 72 Years  PCP: Evelena Peat [facility])  Office Follow Up:  Does the office need to follow up with this patient?: Yes  Instructions For The Office: Spouse/Judy is calling. States patient received a prescription for Pravachol 80mg . daily on 12/06/12. Spouse states Patient was previously prescribed Pravachol 40mg . daily. Spouse states she is unclear if patient is to discontinue the Pravachol 40mg . Spouse is also inquiring if Patient needs to have repeat labwork prior to next scheduled appt. in 6 months. Please return call to patient at [phone] with above clarification/information.  RN Note:  Spouse/Judy is calling. States patient received a prescription for Pravachol 80mg . daily on 12/06/12. Spouse states Patient was previously prescribed Pravachol 40mg . daily. Spouse states she is unclear if patient is to discontinue the Pravachol 40mg . Spouse is also inquiring if Patient needs to have repeat labwork prior to next scheduled appt. in 6 months. Please return call to patient at [phone] with above clarification/information.  Symptoms  Reason For Call & Symptoms: Medication clarification and lab concern  Reviewed Health History In EMR: Yes  Reviewed Medications In EMR: Yes  Reviewed Allergies In EMR: Yes  Reviewed Surgeries / Procedures: Yes  Date of Onset of Symptoms: Unknown  Guideline(s) Used:  No Protocol Available - Information Only  Disposition Per Guideline:   Discuss with PCP and Callback by Nurse Today  Reason For Disposition Reached:   Nursing judgment  Advice Given:  Call Back If:  New symptoms develop  You become worse.  Patient Will Follow Care Advice:  YES

## 2013-02-23 ENCOUNTER — Telehealth: Payer: Self-pay | Admitting: Family Medicine

## 2013-02-23 MED ORDER — HYDROCODONE-ACETAMINOPHEN 10-325 MG PO TABS: 1.0000 | ORAL_TABLET | Freq: Three times a day (TID) | ORAL | Status: DC | PRN

## 2013-02-23 NOTE — Telephone Encounter (Signed)
Refill once 

## 2013-02-23 NOTE — Telephone Encounter (Signed)
Hydrocodone BID PRN last filled 11-23-12, #60 with 0 refills

## 2013-02-23 NOTE — Telephone Encounter (Signed)
PT wife called to request a refill of his HYDROcodone-acetaminophen (NORCO) 10-325 MG per tablet. She would like it sent to [facility]. Please assist.

## 2013-05-24 ENCOUNTER — Telehealth: Payer: Self-pay

## 2013-05-24 MED ORDER — HYDROCODONE-ACETAMINOPHEN 10-325 MG PO TABS: 1.0000 | ORAL_TABLET | Freq: Three times a day (TID) | ORAL | Status: DC | PRN

## 2013-05-24 NOTE — Telephone Encounter (Signed)
Refill OK

## 2013-05-24 NOTE — Telephone Encounter (Signed)
Called in rx

## 2013-05-24 NOTE — Telephone Encounter (Signed)
Hydrocodone (Norco) 10-325mg  Take 1 tablet every eight hours as needed for  Pain #60   Last refill #60 no refill 02/23/13 12/05/12 last visit   [facility]

## 2013-06-07 ENCOUNTER — Ambulatory Visit (INDEPENDENT_AMBULATORY_CARE_PROVIDER_SITE_OTHER): Payer: Medicare Other | Admitting: Family Medicine

## 2013-06-07 ENCOUNTER — Encounter: Payer: Self-pay | Admitting: Family Medicine

## 2013-06-07 VITALS — BP 120/66 | HR 55 | Temp 98.0°F | Wt 251.0 lb

## 2013-06-07 DIAGNOSIS — I10 Essential (primary) hypertension: Secondary | ICD-10-CM

## 2013-06-07 DIAGNOSIS — I2510 Atherosclerotic heart disease of native coronary artery without angina pectoris: Secondary | ICD-10-CM

## 2013-06-07 DIAGNOSIS — E785 Hyperlipidemia, unspecified: Secondary | ICD-10-CM

## 2013-06-07 DIAGNOSIS — E669 Obesity, unspecified: Secondary | ICD-10-CM

## 2013-06-07 LAB — HEPATIC FUNCTION PANEL
ALT: 16 U/L (ref 0–53)
AST: 18 U/L (ref 0–37)
Albumin: 4.1 g/dL (ref 3.5–5.2)
Alkaline Phosphatase: 56 U/L (ref 39–117)
Bilirubin, Direct: 0.1 mg/dL (ref 0.0–0.3)
Total Protein: 7.2 g/dL (ref 6.0–8.3)

## 2013-06-07 LAB — LIPID PANEL
Cholesterol: 156 mg/dL (ref 0–200)
LDL Cholesterol: 90 mg/dL (ref 0–99)
Triglycerides: 158 mg/dL — ABNORMAL HIGH (ref 0.0–149.0)

## 2013-06-07 MED ORDER — NITROGLYCERIN 0.4 MG SL SUBL: 0.4000 mg | SUBLINGUAL_TABLET | SUBLINGUAL | Status: DC | PRN

## 2013-06-07 NOTE — Progress Notes (Signed)
  Subjective:    Patient ID: Rodney Barnett, male    DOB: Feb 19, 1941, 72 y.o.   MRN: [id number]  HPI Patient here for medical followup He had bypass surgery back early 1990s. He also has hypertension hyperlipidemia chronic low back pain. He is compliant with all medications. No recent chest pains. No dizziness. No headaches. No dyspnea. He has difficulty with exercise secondary to some chronic low back pain.  Lipids were elevated last visit with LDL 121. We increased his pravastatin 80 mg. He's had no myalgias. He did have previous myalgias with Lipitor. His battle with some weight issues for several years.  We discussed influenza and pneumococcal vaccines and he declines both.    Past Medical History  Diagnosis Date  . LIPOMA 11/15/2009  . Mixed hyperlipidemia 12/27/2008  . ERECTILE DYSFUNCTION 12/27/2008  . Essential hypertension, benign 12/27/2008  . Coronary atherosclerosis of native coronary artery 10/07/2009    Multivessel, LVEF 55-60%, basal inferior hypokinesis, known graft disease  . RHINITIS 11/15/2009  . INSOMNIA 04/24/2010  . Chronic back pain   . Peripheral neuropathy    Past Surgical History  Procedure Laterality Date  . Coronary artery bypass graft  1994    LIMA to LAD, SVG to RCA, SVG to diagonal    reports that he quit smoking about 20 years ago. His smoking use included Cigarettes. He has a 120 pack-year smoking history. He does not have any smokeless tobacco history on file. He reports that he does not drink alcohol or use illicit drugs. family history includes Hypertension in his other. Allergies  Allergen Reactions  . Lipitor [Atorvastatin] Other (See Comments)    myalgia      Review of Systems  Constitutional: Negative for appetite change, fatigue and unexpected weight change.  Eyes: Negative for visual disturbance.  Respiratory: Negative for cough, chest tightness and shortness of breath.   Cardiovascular: Negative for chest pain, palpitations and leg  swelling.  Gastrointestinal: Negative for abdominal pain.  Endocrine: Negative for polydipsia and polyuria.  Genitourinary: Negative for dysuria.  Musculoskeletal: Positive for back pain (Chronic and unchanged).  Neurological: Negative for dizziness, syncope, weakness, light-headedness and headaches.       Objective:   Physical Exam  Constitutional: He appears well-developed and well-nourished.  HENT:  Right Ear: External ear normal.  Left Ear: External ear normal.  Mouth/Throat: Oropharynx is clear and moist.  Neck: Neck supple. No thyromegaly present.  Cardiovascular: Normal rate and regular rhythm.   Pulmonary/Chest: Effort normal and breath sounds normal. No respiratory distress. He has no wheezes. He has no rales.  Abdominal: Soft. There is no tenderness. There is no rebound and no guarding.  Musculoskeletal: He exhibits no edema.          Assessment & Plan:  #1 hyperlipidemia. Recent lipids not at goal. Recheck lipid panel after recent increase in pravastatin. If still not to goal, consider other options such as Crestor. Previous intolerance to Lipitor #2 hypertension. Well controlled. Continue current medications #3 history of CAD with remote history of bypass. He's done well for several years. No recent chest pain #4 health maintenance. Discussion regarding weight loss strategies. We have recommended Pneumovax and influenza vaccine he declines both

## 2013-06-21 ENCOUNTER — Other Ambulatory Visit: Payer: Self-pay | Admitting: Cardiology

## 2013-07-19 ENCOUNTER — Other Ambulatory Visit: Payer: Self-pay | Admitting: Family Medicine

## 2013-08-18 ENCOUNTER — Telehealth: Payer: Self-pay | Admitting: Family Medicine

## 2013-08-18 ENCOUNTER — Other Ambulatory Visit: Payer: Self-pay | Admitting: Family Medicine

## 2013-08-18 MED ORDER — HYDROCODONE-ACETAMINOPHEN 10-325 MG PO TABS: 1.0000 | ORAL_TABLET | Freq: Three times a day (TID) | ORAL | Status: DC | PRN

## 2013-08-18 NOTE — Telephone Encounter (Signed)
Pt aware that RX is ready for pick up  

## 2013-08-18 NOTE — Telephone Encounter (Signed)
Last visit 06/07/13 Last refill 05/24/13 #60 0 refill

## 2013-08-18 NOTE — Telephone Encounter (Signed)
Pt would like a refill of HYDROcodone-acetaminophen (NORCO) 10-325 MG per tablet

## 2013-08-18 NOTE — Telephone Encounter (Signed)
Refill OK

## 2013-08-28 ENCOUNTER — Encounter: Payer: Self-pay | Admitting: Family Medicine

## 2013-08-29 ENCOUNTER — Ambulatory Visit (INDEPENDENT_AMBULATORY_CARE_PROVIDER_SITE_OTHER): Payer: Medicare Other | Admitting: Family Medicine

## 2013-08-29 ENCOUNTER — Encounter: Payer: Self-pay | Admitting: Family Medicine

## 2013-08-29 VITALS — BP 144/88 | Temp 97.9°F | Wt 262.0 lb

## 2013-08-29 DIAGNOSIS — I10 Essential (primary) hypertension: Secondary | ICD-10-CM

## 2013-08-29 DIAGNOSIS — R319 Hematuria, unspecified: Secondary | ICD-10-CM

## 2013-08-29 LAB — POCT URINALYSIS DIPSTICK
Nitrite, UA: NEGATIVE
Spec Grav, UA: 1.025
Urobilinogen, UA: 1
pH, UA: 5.5

## 2013-08-29 NOTE — Progress Notes (Signed)
  Subjective:    Patient ID: Rodney Barnett, male    DOB: [birth year], 72 y.o.   MRN: [id number]  HPI Patient is seen with acute issue of gross hematuria He relates about one week ago he had one episode of gross hematuria without any other symptoms. Those symptoms cleared and then yesterday morning he had a second episode of gross hematuria. No significant dysuria. No fever or chills. He's never had previous history of hematuria. No history of kidney stones. No recent appetite or weight changes. Patient is an ex-smoker and quit in 1994 with approximately 120-pack-year history. He has not had any recent flank pain or appetite or weight changes. He had again some gross hematuria this morning. His hematuria seems to be worse at the beginning of urination and then lightens up near the end of urination.  PSA last March 1.11.  He does take Plavix and aspirin with history of CAD. No other bleeding complications.  Hypertension has been stable by home readings. He is compliant with all medications. No recent headaches or dizziness.  Past Medical History  Diagnosis Date  . LIPOMA 11/15/2009  . Mixed hyperlipidemia 12/27/2008  . ERECTILE DYSFUNCTION 12/27/2008  . Essential hypertension, benign 12/27/2008  . Coronary atherosclerosis of native coronary artery 10/07/2009    Multivessel, LVEF 55-60%, basal inferior hypokinesis, known graft disease  . RHINITIS 11/15/2009  . INSOMNIA 04/24/2010  . Chronic back pain   . Peripheral neuropathy    Past Surgical History  Procedure Laterality Date  . Coronary artery bypass graft  1994    LIMA to LAD, SVG to RCA, SVG to diagonal    reports that he quit smoking about 20 years ago. His smoking use included Cigarettes. He has a 120 pack-year smoking history. He does not have any smokeless tobacco history on file. He reports that he does not drink alcohol or use illicit drugs. family history includes Cancer in his brother; Hypertension in his other. Allergies  Allergen  Reactions  . Lipitor [Atorvastatin] Other (See Comments)    myalgia      Review of Systems  Constitutional: Negative for fever, chills, appetite change and unexpected weight change.  Gastrointestinal: Negative for abdominal pain.  Genitourinary: Positive for hematuria. Negative for urgency, flank pain, decreased urine volume, scrotal swelling, difficulty urinating, genital sores and testicular pain.       Objective:   Physical Exam  Constitutional: He appears well-developed and well-nourished.  Cardiovascular: Normal rate and regular rhythm.   Pulmonary/Chest: Effort normal and breath sounds normal. No respiratory distress. He has no wheezes. He has no rales.  Abdominal: Soft. There is no tenderness.          Assessment & Plan:  Patient seen with new problem of gross hematuria. Urine dipstick does not reveal any leukocytes or nitrites. This does not appear to be likely infection related. No history of kidney stones. Needs further workup for gross hematuria in this ex-smoker. Urology referral made. Hold 81 mg aspirin for the next few days. Hydrate well. Followup promptly for any fever or worsening symptoms  Hypertension with marginal control. Continue close monitoring

## 2013-08-29 NOTE — Progress Notes (Signed)
Pre visit review using our clinic review tool, if applicable. No additional management support is needed unless otherwise documented below in the visit note. 

## 2013-08-29 NOTE — Patient Instructions (Signed)
Hold 81 mg aspirin for next few days. We will call you with urology appointment. Follow up for any fever or increasing blood.

## 2013-08-30 LAB — URINE CULTURE: Organism ID, Bacteria: NO GROWTH

## 2013-11-16 ENCOUNTER — Ambulatory Visit (INDEPENDENT_AMBULATORY_CARE_PROVIDER_SITE_OTHER): Payer: Medicare Other | Admitting: Family Medicine

## 2013-11-16 ENCOUNTER — Encounter: Payer: Self-pay | Admitting: Family Medicine

## 2013-11-16 VITALS — BP 130/70 | HR 56 | Temp 97.6°F | Wt 258.0 lb

## 2013-11-16 DIAGNOSIS — Z125 Encounter for screening for malignant neoplasm of prostate: Secondary | ICD-10-CM

## 2013-11-16 DIAGNOSIS — I10 Essential (primary) hypertension: Secondary | ICD-10-CM

## 2013-11-16 DIAGNOSIS — G8929 Other chronic pain: Secondary | ICD-10-CM

## 2013-11-16 DIAGNOSIS — M549 Dorsalgia, unspecified: Secondary | ICD-10-CM

## 2013-11-16 LAB — PSA: PSA: 1.41 ng/mL (ref 0.10–4.00)

## 2013-11-16 MED ORDER — HYDROCODONE-ACETAMINOPHEN 10-325 MG PO TABS: 1.0000 | ORAL_TABLET | Freq: Three times a day (TID) | ORAL | Status: DC | PRN

## 2013-11-16 NOTE — Progress Notes (Signed)
   Subjective:    Patient ID: Rodney Barnett, male    DOB: Mar 02, 1941, 73 y.o.   MRN: [id number]  HPI Patient seen for medical followup He has history of CAD with bypass many years ago. He's done well for several years now. No recent chest pains. Current medications reviewed and compliant with all. He has hyperlipidemia and we increased his pravastatin several months ago to 80 mg which is tolerated well. Recent LDL less than 100.  He has chronic low back pain followed by neurosurgeon. He is considering back surgery but is trying to hold off. He is currently not getting any regular exercise. Has for several years taken very sparing hydrocodone for severe pain. Generally takes about one per day. Requesting refill.  Hypertension treated with lisinopril metoprolol. Blood pressures been stable. No headaches. No dizziness. Patient requesting repeat PSA. He had what sounds like acute prostatitis back in December treated urgent care. Denies any current urinary symptoms. No obstructive symptoms.  Past Medical History  Diagnosis Date  . LIPOMA 11/15/2009  . Mixed hyperlipidemia 12/27/2008  . ERECTILE DYSFUNCTION 12/27/2008  . Essential hypertension, benign 12/27/2008  . Coronary atherosclerosis of native coronary artery 10/07/2009    Multivessel, LVEF 55-60%, basal inferior hypokinesis, known graft disease  . RHINITIS 11/15/2009  . INSOMNIA 04/24/2010  . Chronic back pain   . Peripheral neuropathy    Past Surgical History  Procedure Laterality Date  . Coronary artery bypass graft  1994    LIMA to LAD, SVG to RCA, SVG to diagonal    reports that he quit smoking about 20 years ago. His smoking use included Cigarettes. He has a 120 pack-year smoking history. He does not have any smokeless tobacco history on file. He reports that he does not drink alcohol or use illicit drugs. family history includes Cancer in his brother; Hypertension in his other. Allergies  Allergen Reactions  . Lipitor [Atorvastatin]  Other (See Comments)    myalgia      Review of Systems  Constitutional: Negative for fatigue.  Eyes: Negative for visual disturbance.  Respiratory: Negative for cough, chest tightness and shortness of breath.   Cardiovascular: Negative for chest pain, palpitations and leg swelling.  Endocrine: Negative for polydipsia and polyuria.  Genitourinary: Negative for dysuria and difficulty urinating.  Neurological: Negative for dizziness, syncope, weakness, light-headedness and headaches.       Objective:   Physical Exam  Constitutional: He is oriented to person, place, and time. He appears well-developed and well-nourished.  Neck: Neck supple. No thyromegaly present.  Cardiovascular: Normal rate and regular rhythm.   Pulmonary/Chest: Effort normal and breath sounds normal. No respiratory distress. He has no wheezes. He has no rales.  Musculoskeletal: He exhibits no edema.  Neurological: He is alert and oriented to person, place, and time. No cranial nerve deficit.          Assessment & Plan:  #1 hypertension. Well controlled. Continue current medications #2 hyperlipidemia. Recent lipids at goal. Continue pravastatin #3 chronic low back pain. Refill hydrocodone for as needed use. #4 prostate cancer screening. We discussed pros and cons of screening and requesting PSA. This be repeated

## 2013-11-16 NOTE — Progress Notes (Signed)
Pre visit review using our clinic review tool, if applicable. No additional management support is needed unless otherwise documented below in the visit note. 

## 2013-11-17 ENCOUNTER — Telehealth: Payer: Self-pay | Admitting: Family Medicine

## 2013-11-17 NOTE — Telephone Encounter (Signed)
Relevant patient education assigned to patient using Emmi. ° °

## 2013-12-18 ENCOUNTER — Other Ambulatory Visit: Payer: Self-pay | Admitting: Cardiology

## 2013-12-18 ENCOUNTER — Other Ambulatory Visit: Payer: Self-pay | Admitting: Family Medicine

## 2013-12-27 ENCOUNTER — Encounter: Payer: Self-pay | Admitting: Family Medicine

## 2014-01-16 ENCOUNTER — Telehealth: Payer: Self-pay | Admitting: Cardiology

## 2014-01-16 ENCOUNTER — Other Ambulatory Visit: Payer: Self-pay | Admitting: Cardiology

## 2014-01-16 MED ORDER — LISINOPRIL 10 MG PO TABS: ORAL_TABLET | ORAL | Status: DC

## 2014-01-16 NOTE — Telephone Encounter (Signed)
Received fax refill request  Rx # [id number] Medication:  Lisinopril 10 mg tablet  Qty 30 Sig:  Take one tablet by mouth once daily Physician:  Domenic Polite

## 2014-01-16 NOTE — Telephone Encounter (Signed)
Medication sent via escribe.  

## 2014-01-24 ENCOUNTER — Ambulatory Visit (INDEPENDENT_AMBULATORY_CARE_PROVIDER_SITE_OTHER): Payer: Medicare Other | Admitting: Cardiology

## 2014-01-24 ENCOUNTER — Encounter: Payer: Self-pay | Admitting: Cardiology

## 2014-01-24 VITALS — BP 136/75 | HR 52 | Ht 76.0 in | Wt 256.0 lb

## 2014-01-24 DIAGNOSIS — E785 Hyperlipidemia, unspecified: Secondary | ICD-10-CM

## 2014-01-24 DIAGNOSIS — I10 Essential (primary) hypertension: Secondary | ICD-10-CM

## 2014-01-24 DIAGNOSIS — I2510 Atherosclerotic heart disease of native coronary artery without angina pectoris: Secondary | ICD-10-CM

## 2014-01-24 NOTE — Assessment & Plan Note (Signed)
He has been tolerating Pravachol, LDL 90 from September 2014.

## 2014-01-24 NOTE — Assessment & Plan Note (Signed)
Continue medical therapy and observation. Medications and ECG reviewed. He has documented graft disease by his last cardiac catheterization in 2010, although has done well clinically.

## 2014-01-24 NOTE — Assessment & Plan Note (Signed)
No changes made to current regimen.

## 2014-01-24 NOTE — Patient Instructions (Signed)
Your physician recommends that you schedule a follow-up appointment in: 1 year with Dr McDowell You will receive a reminder letter two months in advance reminding you to call and schedule your appointment. If you don't receive this letter, please contact our office.  Your physician recommends that you continue on your current medications as directed. Please refer to the Current Medication list given to you today.    

## 2014-01-24 NOTE — Progress Notes (Signed)
Clinical Summary [name] is a 73 y.o.male last seen in January 2014. Continues to have interval primary care followup with Dr. Elease Barnett. He has done well from a cardiac perspective, denies any angina or progressive shortness of breath. Mainly limited by disc problems in his back, but still able to do his ADLs including yard work. He reports compliance with his medications.  Lab work from September 2014 showed cholesterol 156, triglycerides 158, HDL 34, LDL 90, normal LFTs.  Cardiac catheterization in 2010 showed occluded SVG to RCA and SVG to diagonal, although patent LIMA to LAD. The RCA was occluded with left to right collaterals and the circumflex was nonobstructive. He has been managed medically.  ECG today shows sinus bradycardia, nonspecific T wave changes, anteroseptal Q's.   Allergies  Allergen Reactions  . Lipitor [Atorvastatin] Other (See Comments)    myalgia    Current Outpatient Prescriptions  Medication Sig Dispense Refill  . aspirin 81 MG tablet Take 81 mg by mouth daily.        . clopidogrel (PLAVIX) 75 MG tablet TAKE (1) TABLET BY MOUTH ONCE DAILY.  90 tablet  3  . gabapentin (NEURONTIN) 300 MG capsule TAKE (2) CAPSULES BY MOUTH THREE TIMES DAILY.  180 capsule  3  . HYDROcodone-acetaminophen (NORCO) 10-325 MG per tablet Take 1 tablet by mouth every 8 (eight) hours as needed.  90 tablet  0  . isosorbide mononitrate (IMDUR) 30 MG 24 hr tablet TAKE (1) TABLET BY MOUTH ONCE DAILY.  30 tablet  6  . lisinopril (PRINIVIL,ZESTRIL) 10 MG tablet TAKE (1) TABLET BY MOUTH ONCE DAILY.  90 tablet  3  . metoprolol tartrate (LOPRESSOR) 25 MG tablet Take 1 tablet (25 mg total) by mouth 2 (two) times daily.  180 tablet  3  . nitroGLYCERIN (NITROSTAT) 0.4 MG SL tablet Place 1 tablet (0.4 mg total) under the tongue every 5 (five) minutes as needed.  20 tablet  0  . pravastatin (PRAVACHOL) 80 MG tablet TAKE 1 TABLET DAILY.  30 tablet  11   No current facility-administered medications  for this visit.    Past Medical History  Diagnosis Date  . LIPOMA 11/15/2009  . Mixed hyperlipidemia 12/27/2008  . ERECTILE DYSFUNCTION 12/27/2008  . Essential hypertension, benign 12/27/2008  . Coronary atherosclerosis of native coronary artery 10/07/2009    Multivessel, LVEF 55-60%, basal inferior hypokinesis, known graft disease  . RHINITIS 11/15/2009  . INSOMNIA 04/24/2010  . Chronic back pain   . Peripheral neuropathy     Past Surgical History  Procedure Laterality Date  . Coronary artery bypass graft  1994    LIMA to LAD, SVG to RCA, SVG to diagonal    Social History [name] reports that he quit smoking about 21 years ago. His smoking use included Cigarettes. He has a 120 pack-year smoking history. He does not have any smokeless tobacco history on file. [name] reports that he does not drink alcohol.  Review of Systems No palpitations, dizziness, syncope, claudication. Otherwise as outlined.  Physical Examination Filed Vitals:   01/24/14 1304  BP: 136/75  Pulse: 52   Filed Weights   01/24/14 1304  Weight: 256 lb (116.121 kg)    Overweight male in no acute distress.  HEENT: Conjunctiva and lids normal, oropharynx clear.  Neck: Supple, no elevated jugular venous pressure, no thyromegaly.  Lungs: Diminished breath sounds, nonlabored.  Cardiac: Regular rate and rhythm, no S3 gallop. Soft S4.  Abdomen: Soft, nontender, bowel sounds present. No  bruit.  Extremities: Trace ankle edema. Distal pulses 1-2+.  Skin: Warm and dry.  Musculoskeletal: No kyphosis.  Neuropsychiatric: Alert and oriented x3, affect appropriate.   Problem List and Plan   CORONARY ATHEROSCLEROSIS NATIVE CORONARY ARTERY Continue medical therapy and observation. Medications and ECG reviewed. He has documented graft disease by his last cardiac catheterization in 2010, although has done well clinically.  HYPERLIPIDEMIA He has been tolerating Pravachol, LDL 90 from September 2014.  HYPERTENSION No  changes made to current regimen.    Satira Sark, M.D., F.A.C.C.

## 2014-02-13 ENCOUNTER — Telehealth: Payer: Self-pay | Admitting: Family Medicine

## 2014-02-13 NOTE — Telephone Encounter (Signed)
Refill OK

## 2014-02-13 NOTE — Telephone Encounter (Signed)
Pt is needing new rx HYDROcodone-acetaminophen (NORCO) 10-325 MG per tablet, please call when available for pick up. ° °

## 2014-02-13 NOTE — Telephone Encounter (Signed)
Last visit 11/16/13 Last refill 11/16/13 #90 0 refill

## 2014-02-14 ENCOUNTER — Other Ambulatory Visit: Payer: Self-pay | Admitting: Family Medicine

## 2014-02-15 MED ORDER — HYDROCODONE-ACETAMINOPHEN 10-325 MG PO TABS: 1.0000 | ORAL_TABLET | Freq: Three times a day (TID) | ORAL | Status: DC | PRN

## 2014-02-15 NOTE — Telephone Encounter (Signed)
Left message RX is ready for pick up

## 2014-03-19 ENCOUNTER — Other Ambulatory Visit: Payer: Self-pay | Admitting: Family Medicine

## 2014-05-17 ENCOUNTER — Ambulatory Visit (INDEPENDENT_AMBULATORY_CARE_PROVIDER_SITE_OTHER): Payer: Medicare Other | Admitting: Family Medicine

## 2014-05-17 ENCOUNTER — Encounter: Payer: Self-pay | Admitting: Family Medicine

## 2014-05-17 VITALS — BP 134/72 | HR 60 | Wt 256.0 lb

## 2014-05-17 DIAGNOSIS — I10 Essential (primary) hypertension: Secondary | ICD-10-CM

## 2014-05-17 DIAGNOSIS — I2510 Atherosclerotic heart disease of native coronary artery without angina pectoris: Secondary | ICD-10-CM

## 2014-05-17 DIAGNOSIS — E785 Hyperlipidemia, unspecified: Secondary | ICD-10-CM

## 2014-05-17 LAB — LIPID PANEL
CHOL/HDL RATIO: 5
Cholesterol: 169 mg/dL (ref 0–200)
HDL: 32.3 mg/dL — ABNORMAL LOW (ref >39.00)
LDL CALC: 104 mg/dL — AB (ref 0–99)
NONHDL: 136.7
Triglycerides: 165 mg/dL — ABNORMAL HIGH (ref 0.0–149.0)
VLDL: 33 mg/dL (ref 0.0–40.0)

## 2014-05-17 LAB — BASIC METABOLIC PANEL
BUN: 11 mg/dL (ref 6–23)
CALCIUM: 9.3 mg/dL (ref 8.4–10.5)
CO2: 29 meq/L (ref 19–32)
CREATININE: 1.1 mg/dL (ref 0.4–1.5)
Chloride: 105 mEq/L (ref 96–112)
GFR: 70.68 mL/min (ref >60.00)
Glucose, Bld: 98 mg/dL (ref 70–99)
Potassium: 4.7 mEq/L (ref 3.5–5.1)
Sodium: 140 mEq/L (ref 135–145)

## 2014-05-17 LAB — HEPATIC FUNCTION PANEL
ALT: 19 U/L (ref 0–53)
AST: 22 U/L (ref 0–37)
Albumin: 4.1 g/dL (ref 3.5–5.2)
Alkaline Phosphatase: 61 U/L (ref 39–117)
BILIRUBIN DIRECT: 0 mg/dL (ref 0.0–0.3)
BILIRUBIN TOTAL: 0.9 mg/dL (ref 0.2–1.2)
Total Protein: 7.3 g/dL (ref 6.0–8.3)

## 2014-05-17 MED ORDER — HYDROCODONE-ACETAMINOPHEN 10-325 MG PO TABS: 1.0000 | ORAL_TABLET | Freq: Three times a day (TID) | ORAL | Status: DC | PRN

## 2014-05-17 NOTE — Progress Notes (Signed)
   Subjective:    Patient ID: Rodney Barnett, male    DOB: 1941-03-09, 73 y.o.   MRN: [id number]  HPI Patient seen for medical followup. He has multiple chronic problems include long-standing history of CAD, hypertension, hyperlipidemia, chronic low back pain. Medications reviewed  No recent chest pains. He remains on Plavix and aspirin. Blood pressure controlled with lisinopril, metoprolol, and low dose isosorbide mononitrate. He takes pravastatin for hyperlipidemia. He is due for followup lab work.  Has some chronic low back pain which is mostly at night. He is taken once nightly hydrocodone for several years which does help his back pain tremendously. He has not gotten relief with over-the-counter medications. Avoids nonsteroidals with chronic Plavix and aspirin use.  Past Medical History  Diagnosis Date  . LIPOMA 11/15/2009  . Mixed hyperlipidemia 12/27/2008  . ERECTILE DYSFUNCTION 12/27/2008  . Essential hypertension, benign 12/27/2008  . Coronary atherosclerosis of native coronary artery 10/07/2009    Multivessel, LVEF 55-60%, basal inferior hypokinesis, known graft disease  . RHINITIS 11/15/2009  . INSOMNIA 04/24/2010  . Chronic back pain   . Peripheral neuropathy    Past Surgical History  Procedure Laterality Date  . Coronary artery bypass graft  1994    LIMA to LAD, SVG to RCA, SVG to diagonal    reports that he quit smoking about 21 years ago. His smoking use included Cigarettes. He has a 120 pack-year smoking history. He does not have any smokeless tobacco history on file. He reports that he does not drink alcohol or use illicit drugs. family history includes Cancer in his brother; Hypertension in his other. Allergies  Allergen Reactions  . Lipitor [Atorvastatin] Other (See Comments)    myalgia      Review of Systems  Constitutional: Negative for appetite change, fatigue and unexpected weight change.  Eyes: Negative for visual disturbance.  Respiratory: Negative for cough,  chest tightness and shortness of breath.   Cardiovascular: Negative for chest pain, palpitations and leg swelling.  Gastrointestinal: Negative for abdominal pain.  Endocrine: Negative for polydipsia and polyuria.  Neurological: Negative for dizziness, syncope, weakness, light-headedness and headaches.       Objective:   Physical Exam  Constitutional: He is oriented to person, place, and time. He appears well-developed and well-nourished.  HENT:  Right Ear: External ear normal.  Left Ear: External ear normal.  Mouth/Throat: Oropharynx is clear and moist.  Eyes: Pupils are equal, round, and reactive to light.  Neck: Neck supple. No thyromegaly present.  Cardiovascular: Normal rate and regular rhythm.   Pulmonary/Chest: Effort normal and breath sounds normal. No respiratory distress. He has no wheezes. He has no rales.  Musculoskeletal: He exhibits no edema.  Neurological: He is alert and oriented to person, place, and time.          Assessment & Plan:  #1 history of CAD. Symptomatically stable. He has history of hyperlipidemia. Will check lipid and hepatic panel. Continue pravastatin #2 hypertension which is stable and at goal. Continue current medications #3 chronic low back pain. Stable. Refilled hydrocodone which he uses only at night as needed

## 2014-05-17 NOTE — Progress Notes (Signed)
Pre visit review using our clinic review tool, if applicable. No additional management support is needed unless otherwise documented below in the visit note. 

## 2014-06-12 ENCOUNTER — Telehealth: Payer: Self-pay | Admitting: Family Medicine

## 2014-06-12 NOTE — Telephone Encounter (Signed)
Pt is having cataract surgery on 06/18/14 and Dr Kathrin Penner would like to know if patient can come off plavix and aspirin  for 3 days . Making 06/14/14 his last dosages until after the surgery

## 2014-06-13 NOTE — Telephone Encounter (Signed)
yes

## 2014-06-13 NOTE — Telephone Encounter (Signed)
Pt informed

## 2014-06-14 ENCOUNTER — Other Ambulatory Visit: Payer: Self-pay | Admitting: Family Medicine

## 2014-07-05 ENCOUNTER — Encounter: Payer: Self-pay | Admitting: Gastroenterology

## 2014-07-16 ENCOUNTER — Telehealth: Payer: Self-pay | Admitting: *Deleted

## 2014-07-16 MED ORDER — ISOSORBIDE MONONITRATE ER 30 MG PO TB24: ORAL_TABLET | ORAL | Status: DC

## 2014-07-16 NOTE — Telephone Encounter (Signed)
Isosorbide er 30 mg to [facility]

## 2014-08-15 ENCOUNTER — Other Ambulatory Visit: Payer: Self-pay | Admitting: Cardiology

## 2014-08-15 ENCOUNTER — Telehealth: Payer: Self-pay | Admitting: Family Medicine

## 2014-08-15 MED ORDER — HYDROCODONE-ACETAMINOPHEN 10-325 MG PO TABS: 1.0000 | ORAL_TABLET | Freq: Three times a day (TID) | ORAL | Status: DC | PRN

## 2014-08-15 MED ORDER — LISINOPRIL 10 MG PO TABS: ORAL_TABLET | ORAL | Status: DC

## 2014-08-15 NOTE — Telephone Encounter (Signed)
Refill once.  Avoid regular use. 

## 2014-08-15 NOTE — Telephone Encounter (Signed)
Received fax refill request  Rx # [id number] Medication:  Lisinopril 10 mg tablet Qty 90 Sig:  Take one tablet by mouth once daily Physician:  Domenic Polite

## 2014-08-15 NOTE — Telephone Encounter (Signed)
Pt's wife is requesting re-fill on HYDROcodone-acetaminophen (NORCO) 10-325 MG per tablet

## 2014-08-15 NOTE — Telephone Encounter (Signed)
Last visit 05/17/14 Last refill 05/17/14 #90 0 refill

## 2014-08-15 NOTE — Telephone Encounter (Signed)
Pt aware that Rx is ready for pick up 

## 2014-10-12 ENCOUNTER — Other Ambulatory Visit: Payer: Self-pay | Admitting: Family Medicine

## 2014-11-19 ENCOUNTER — Ambulatory Visit: Payer: Medicare Other | Admitting: Family Medicine

## 2014-11-22 ENCOUNTER — Encounter: Payer: Self-pay | Admitting: Family Medicine

## 2014-11-22 ENCOUNTER — Ambulatory Visit (INDEPENDENT_AMBULATORY_CARE_PROVIDER_SITE_OTHER): Payer: Medicare Other | Admitting: Family Medicine

## 2014-11-22 VITALS — BP 138/84 | HR 56 | Temp 97.7°F | Wt 260.0 lb

## 2014-11-22 DIAGNOSIS — M549 Dorsalgia, unspecified: Secondary | ICD-10-CM

## 2014-11-22 DIAGNOSIS — G8929 Other chronic pain: Secondary | ICD-10-CM

## 2014-11-22 DIAGNOSIS — E785 Hyperlipidemia, unspecified: Secondary | ICD-10-CM

## 2014-11-22 DIAGNOSIS — I10 Essential (primary) hypertension: Secondary | ICD-10-CM

## 2014-11-22 DIAGNOSIS — G629 Polyneuropathy, unspecified: Secondary | ICD-10-CM

## 2014-11-22 LAB — LIPID PANEL
Cholesterol: 161 mg/dL (ref 0–200)
HDL: 31.5 mg/dL — ABNORMAL LOW (ref >39.00)
LDL CALC: 102 mg/dL — AB (ref 0–99)
NonHDL: 129.5
Total CHOL/HDL Ratio: 5
Triglycerides: 140 mg/dL (ref 0.0–149.0)
VLDL: 28 mg/dL (ref 0.0–40.0)

## 2014-11-22 LAB — HEPATIC FUNCTION PANEL
ALK PHOS: 64 U/L (ref 39–117)
ALT: 21 U/L (ref 0–53)
AST: 23 U/L (ref 0–37)
Albumin: 4.1 g/dL (ref 3.5–5.2)
Bilirubin, Direct: 0.1 mg/dL (ref 0.0–0.3)
Total Bilirubin: 0.7 mg/dL (ref 0.2–1.2)
Total Protein: 7.4 g/dL (ref 6.0–8.3)

## 2014-11-22 LAB — BASIC METABOLIC PANEL
BUN: 14 mg/dL (ref 6–23)
CHLORIDE: 103 meq/L (ref 96–112)
CO2: 30 mEq/L (ref 19–32)
Calcium: 9.4 mg/dL (ref 8.4–10.5)
Creatinine, Ser: 1.03 mg/dL (ref 0.40–1.50)
GFR: 75.34 mL/min (ref >60.00)
Glucose, Bld: 107 mg/dL — ABNORMAL HIGH (ref 70–99)
POTASSIUM: 5.1 meq/L (ref 3.5–5.1)
Sodium: 138 mEq/L (ref 135–145)

## 2014-11-22 LAB — VITAMIN B12: Vitamin B-12: 240 pg/mL (ref 211–911)

## 2014-11-22 MED ORDER — HYDROCODONE-ACETAMINOPHEN 10-325 MG PO TABS: 1.0000 | ORAL_TABLET | Freq: Three times a day (TID) | ORAL | Status: DC | PRN

## 2014-11-22 NOTE — Progress Notes (Signed)
Pre visit review using our clinic review tool, if applicable. No additional management support is needed unless otherwise documented below in the visit note. 

## 2014-11-22 NOTE — Progress Notes (Signed)
   Subjective:    Patient ID: Rodney Barnett, male    DOB: 02-21-1941, 74 y.o.   MRN: [id number]  HPI Patient seen for medical follow-up. His chronic problems include history of CAD. He had bypass about 21 years ago and has done extremely well since then. He quit smoking at that time. He's had some chronic low back pains and takes very low-dose hydrocodone one half to one tablet daily generally and has for years. He has history of idiopathic peripheral neuropathy worked up at [facility] about 10 years ago. This is controlled with gabapentin. No history of diabetes. Hypertension which has been stable. Hyperlipidemia treated with pravastatin. No recent chest pains. Compliant with all medications. Denies any side effects.  Past Medical History  Diagnosis Date  . LIPOMA 11/15/2009  . Mixed hyperlipidemia 12/27/2008  . ERECTILE DYSFUNCTION 12/27/2008  . Essential hypertension, benign 12/27/2008  . Coronary atherosclerosis of native coronary artery 10/07/2009    Multivessel, LVEF 55-60%, basal inferior hypokinesis, known graft disease  . RHINITIS 11/15/2009  . INSOMNIA 04/24/2010  . Chronic back pain   . Peripheral neuropathy    Past Surgical History  Procedure Laterality Date  . Coronary artery bypass graft  1994    LIMA to LAD, SVG to RCA, SVG to diagonal    reports that he quit smoking about 21 years ago. His smoking use included Cigarettes. He has a 120 pack-year smoking history. He does not have any smokeless tobacco history on file. He reports that he does not drink alcohol or use illicit drugs. family history includes Cancer in his brother; Hypertension in his other. Allergies  Allergen Reactions  . Lipitor [Atorvastatin] Other (See Comments)    myalgia      Review of Systems  Constitutional: Negative for fatigue.  Eyes: Negative for visual disturbance.  Respiratory: Negative for cough, chest tightness and shortness of breath.   Cardiovascular: Negative for chest pain, palpitations  and leg swelling.  Endocrine: Negative for polydipsia and polyuria.  Neurological: Negative for dizziness, syncope, weakness, light-headedness and headaches.       Objective:   Physical Exam  Constitutional: He is oriented to person, place, and time. He appears well-developed and well-nourished.  HENT:  Right Ear: External ear normal.  Left Ear: External ear normal.  Mouth/Throat: Oropharynx is clear and moist.  Eyes: Pupils are equal, round, and reactive to light.  Neck: Neck supple. No thyromegaly present.  Cardiovascular: Normal rate and regular rhythm.   Pulmonary/Chest: Effort normal and breath sounds normal. No respiratory distress. He has no wheezes. He has no rales.  Musculoskeletal: He exhibits no edema.  Neurological: He is alert and oriented to person, place, and time.          Assessment & Plan:  #1 hypertension. Stable. Continue current medications  #2 hyperlipidemia. Check lipid and hepatic panel. Continue pravastatin  #3 history of idiopathic peripheral neuropathy. Currently stable on gabapentin.  Check B12 level. #4 chronic low back pain. Refill hydrocodone which he uses very infrequently. No history of misuse. #5 health maintenance. We discussed abdominal aneurysm screening given his history of risk factors and he declines.

## 2014-12-10 ENCOUNTER — Other Ambulatory Visit: Payer: Self-pay | Admitting: Cardiology

## 2015-01-08 ENCOUNTER — Other Ambulatory Visit: Payer: Self-pay | Admitting: Family Medicine

## 2015-01-15 ENCOUNTER — Other Ambulatory Visit: Payer: Self-pay | Admitting: Family Medicine

## 2015-01-15 ENCOUNTER — Other Ambulatory Visit: Payer: Self-pay | Admitting: Cardiovascular Disease

## 2015-01-28 ENCOUNTER — Encounter: Payer: Self-pay | Admitting: Cardiology

## 2015-01-28 ENCOUNTER — Ambulatory Visit (INDEPENDENT_AMBULATORY_CARE_PROVIDER_SITE_OTHER): Payer: Medicare Other | Admitting: Cardiology

## 2015-01-28 VITALS — BP 110/58 | HR 65 | Ht 76.0 in | Wt 256.0 lb

## 2015-01-28 DIAGNOSIS — I10 Essential (primary) hypertension: Secondary | ICD-10-CM | POA: Diagnosis not present

## 2015-01-28 DIAGNOSIS — E782 Mixed hyperlipidemia: Secondary | ICD-10-CM | POA: Diagnosis not present

## 2015-01-28 DIAGNOSIS — I2510 Atherosclerotic heart disease of native coronary artery without angina pectoris: Secondary | ICD-10-CM

## 2015-01-28 NOTE — Patient Instructions (Signed)
Your physician wants you to follow-up in: 1 year with Dr McDowell You will receive a reminder letter in the mail two months in advance. If you don't receive a letter, please call our office to schedule the follow-up appointment.   Your physician recommends that you continue on your current medications as directed. Please refer to the Current Medication list given to you today.     Thank you for choosing [facility] !        

## 2015-01-28 NOTE — Progress Notes (Signed)
Cardiology Office Note  Date: 01/28/2015   ID: Raymondo, Garcialopez 10/06/1940, MRN [id number]  PCP: [name], MD  Primary Cardiologist: Rozann Lesches, MD   Chief Complaint  Patient presents with  . Coronary Artery Disease    History of Present Illness: Rodney Barnett is a 74 y.o. male last seen in April 2015. He presents for a routine follow-up visit, continues to do well without any significant angina or nitroglycerin use. 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 We reviewed his medications, outlined below. There have been no significant changes.  Cardiac catheterization in 2010 showed occluded SVG to RCA and SVG to diagonal, although patent LIMA to LAD. The RCA was occluded with left to right collaterals and the circumflex was nonobstructive. He has been managed medically over time.  He continues on Pravachol, recent LDL 102, was 90 last year.  Past Medical History  Diagnosis Date  . LIPOMA 11/15/2009  . Mixed hyperlipidemia 12/27/2008  . ERECTILE DYSFUNCTION 12/27/2008  . Essential hypertension, benign 12/27/2008  . Coronary atherosclerosis of native coronary artery 10/07/2009    Multivessel, LVEF 55-60%, basal inferior hypokinesis, known graft disease  . RHINITIS 11/15/2009  . INSOMNIA 04/24/2010  . Chronic back pain   . Peripheral neuropathy     Past Surgical History  Procedure Laterality Date  . Coronary artery bypass graft  1994    LIMA to LAD, SVG to RCA, SVG to diagonal    Current Outpatient Prescriptions  Medication Sig Dispense Refill  . aspirin 81 MG tablet Take 81 mg by mouth daily.      . clopidogrel (PLAVIX) 75 MG tablet TAKE (1) TABLET BY MOUTH ONCE DAILY. 30 tablet 6  . gabapentin (NEURONTIN) 300 MG capsule TAKE (2) CAPSULES BY MOUTH THREE TIMES DAILY. 180 capsule 3  . HYDROcodone-acetaminophen (NORCO) 10-325 MG per tablet Take 1 tablet by mouth every 8 (eight) hours as needed. 90 tablet  0  . isosorbide mononitrate (IMDUR) 30 MG 24 hr tablet TAKE 1 TABLET ONCE DAILY. 30 tablet 3  . lisinopril (PRINIVIL,ZESTRIL) 10 MG tablet TAKE (1) TABLET BY MOUTH ONCE DAILY. 90 tablet 3  . metoprolol tartrate (LOPRESSOR) 25 MG tablet TAKE (1) TABLET TWICE DAILY. 60 tablet 3  . nitroGLYCERIN (NITROSTAT) 0.4 MG SL tablet Place 1 tablet (0.4 mg total) under the tongue every 5 (five) minutes as needed. 20 tablet 0  . pravastatin (PRAVACHOL) 80 MG tablet TAKE (1) TABLET BY MOUTH ONCE DAILY. 30 tablet 5   No current facility-administered medications for this visit.    Allergies:  Lipitor   Social History: The patient  reports that he quit smoking about 22 years ago. His smoking use included Cigarettes. He started smoking about 60 years ago. He has a 120 pack-year smoking history. He does not have any smokeless tobacco history on file. He reports that he does not drink alcohol or use illicit drugs.   ROS:  Please see the history of present illness. Otherwise, complete review of systems is positive for mild arthritic symptoms.  All other systems are reviewed and negative.   Physical Exam: VS:  BP 110/58 mmHg  Pulse 65  Ht 6\' 4"  (1.93 m)  Wt 256 lb (116.121 kg)  BMI 31.17 kg/m2  SpO2 95%, BMI Body mass index is 31.17 kg/(m^2).  Wt Readings from Last 3 Encounters:  01/28/15 256 lb (116.121 kg)  11/22/14 260 lb (117.935 kg)  05/17/14  256 lb (116.121 kg)     Overweight male in no acute distress.  HEENT: Conjunctiva and lids normal, oropharynx clear.  Neck: Supple, no elevated jugular venous pressure, no thyromegaly.  Lungs: Diminished breath sounds, nonlabored.  Cardiac: Regular rate and rhythm, no S3 gallop. Soft S4.  Abdomen: Soft, nontender, bowel sounds present. No bruit.  Extremities: Trace ankle edema. Distal pulses 1-2+.  Skin: Warm and dry.  Musculoskeletal: No kyphosis.  Neuropsychiatric: Alert and oriented x3, affect appropriate.   ECG: ECG is ordered today and  reviewed showing sinus bradycardia.  Recent Labwork: 11/22/2014: ALT 21; AST 23; BUN 14; Creatinine 1.03; Potassium 5.1; Sodium 138     Component Value Date/Time   CHOL 161 11/22/2014 0834   TRIG 140.0 11/22/2014 0834   HDL 31.50* 11/22/2014 0834   CHOLHDL 5 11/22/2014 0834   VLDL 28.0 11/22/2014 0834   LDLCALC 102* 11/22/2014 0834   LDLDIRECT 114.8 06/07/2012 0937    ASSESSMENT AND PLAN:  1. 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 He continues to do very well, no angina symptoms on medical therapy. ECG is normal. We will continue observation for now.  2. Hyperlipidemia, continue Pravachol.  3. Hypertension, blood pressure is well controlled.  Current medicines were reviewed at length with the patient today.   Orders Placed This Encounter  Procedures  . EKG 12-Lead    Disposition: FU with me in 1 year.   Signed, Satira Sark, MD, [facility] 01/28/2015 1:33 PM    [facility] at [facility] [address] [place], [place] [address] Phone: [phone]; Fax: [phone]

## 2015-02-14 ENCOUNTER — Encounter: Payer: Self-pay | Admitting: Gastroenterology

## 2015-02-14 ENCOUNTER — Telehealth: Payer: Self-pay | Admitting: Family Medicine

## 2015-02-14 MED ORDER — HYDROCODONE-ACETAMINOPHEN 10-325 MG PO TABS: 1.0000 | ORAL_TABLET | Freq: Three times a day (TID) | ORAL | Status: DC | PRN

## 2015-02-14 NOTE — Telephone Encounter (Signed)
Pt request refill HYDROcodone-acetaminophen (NORCO) 10-325 MG per tablet °

## 2015-02-14 NOTE — Telephone Encounter (Signed)
Last visit 11/22/14 Last refill 11/22/14 #90 0 refill

## 2015-02-14 NOTE — Telephone Encounter (Signed)
Refill OK.  Try to avoid escalation in use.

## 2015-02-14 NOTE — Telephone Encounter (Signed)
Pt aware that Rx is ready for pick up 

## 2015-03-27 ENCOUNTER — Other Ambulatory Visit: Payer: Self-pay | Admitting: Family Medicine

## 2015-05-14 ENCOUNTER — Other Ambulatory Visit: Payer: Self-pay | Admitting: Cardiology

## 2015-05-17 ENCOUNTER — Encounter: Payer: Self-pay | Admitting: Family Medicine

## 2015-05-17 ENCOUNTER — Ambulatory Visit (INDEPENDENT_AMBULATORY_CARE_PROVIDER_SITE_OTHER): Payer: Medicare Other | Admitting: Family Medicine

## 2015-05-17 VITALS — BP 128/72 | HR 60 | Temp 97.8°F | Wt 253.0 lb

## 2015-05-17 DIAGNOSIS — G8929 Other chronic pain: Secondary | ICD-10-CM

## 2015-05-17 DIAGNOSIS — M549 Dorsalgia, unspecified: Secondary | ICD-10-CM | POA: Diagnosis not present

## 2015-05-17 DIAGNOSIS — I10 Essential (primary) hypertension: Secondary | ICD-10-CM | POA: Diagnosis not present

## 2015-05-17 DIAGNOSIS — I2510 Atherosclerotic heart disease of native coronary artery without angina pectoris: Secondary | ICD-10-CM

## 2015-05-17 DIAGNOSIS — E785 Hyperlipidemia, unspecified: Secondary | ICD-10-CM

## 2015-05-17 MED ORDER — NITROGLYCERIN 0.4 MG SL SUBL: 0.4000 mg | SUBLINGUAL_TABLET | SUBLINGUAL | Status: DC | PRN

## 2015-05-17 MED ORDER — HYDROCODONE-ACETAMINOPHEN 10-325 MG PO TABS: 1.0000 | ORAL_TABLET | Freq: Three times a day (TID) | ORAL | Status: DC | PRN

## 2015-05-17 NOTE — Progress Notes (Signed)
Pre visit review using our clinic review tool, if applicable. No additional management support is needed unless otherwise documented below in the visit note. 

## 2015-05-17 NOTE — Progress Notes (Signed)
   Subjective:    Patient ID: Rodney Barnett, male    DOB: 09/29/1941, 74 y.o.   MRN: [id number]  HPI Routine follow-up.  He has history of hyperlipidemia, CAD, hypertension, osteoarthritis and chronic low back pain. He takes oxycodone but very infrequently for low back pain. Requesting refills. No progression of back pain.  Denies recent chest pains. Compliant with all medications. No dyspnea. Requesting refill of some legal nitroglycerin but has not required any recent use. He has some easy bruising related to Plavix and aspirin. Lipids are checked in February and stable.  Past Medical History  Diagnosis Date  . LIPOMA 11/15/2009  . Mixed hyperlipidemia 12/27/2008  . ERECTILE DYSFUNCTION 12/27/2008  . Essential hypertension, benign 12/27/2008  . Coronary atherosclerosis of native coronary artery 10/07/2009    Multivessel, LVEF 55-60%, basal inferior hypokinesis, known graft disease  . RHINITIS 11/15/2009  . INSOMNIA 04/24/2010  . Chronic back pain   . Peripheral neuropathy    Past Surgical History  Procedure Laterality Date  . Coronary artery bypass graft  1994    LIMA to LAD, SVG to RCA, SVG to diagonal    reports that he quit smoking about 22 years ago. His smoking use included Cigarettes. He started smoking about 60 years ago. He has a 120 pack-year smoking history. He does not have any smokeless tobacco history on file. He reports that he does not drink alcohol or use illicit drugs. family history includes Cancer in his brother; Hypertension in his other. Allergies  Allergen Reactions  . Lipitor [Atorvastatin] Other (See Comments)    myalgia      Review of Systems  Constitutional: Negative for fatigue and unexpected weight change.  Eyes: Negative for visual disturbance.  Respiratory: Negative for cough, chest tightness and shortness of breath.   Cardiovascular: Negative for chest pain, palpitations and leg swelling.  Gastrointestinal: Negative for abdominal pain.  Endocrine:  Negative for polydipsia and polyuria.  Genitourinary: Negative for dysuria.  Musculoskeletal: Positive for back pain.  Neurological: Negative for dizziness, syncope, weakness, light-headedness and headaches.       Objective:   Physical Exam  Constitutional: He is oriented to person, place, and time. He appears well-developed and well-nourished. No distress.  Neck: Neck supple. No JVD present.  Cardiovascular: Normal rate and regular rhythm.   Pulmonary/Chest: Effort normal and breath sounds normal. No respiratory distress. He has no wheezes. He has no rales.  Musculoskeletal: He exhibits no edema.  Lymphadenopathy:    He has no cervical adenopathy.  Neurological: He is alert and oriented to person, place, and time.          Assessment & Plan:  #1 hypertension. Stable at goal. Continue current medications #2 chronic low back pain. Refill hydrocodone which she uses infrequently for severe pain. No history of misuse #3 history of CAD. We'll plan repeat lipids in 6 months. Continue pravastatin and metoprolol. Refill nitroglycerin for as needed use

## 2015-06-17 ENCOUNTER — Other Ambulatory Visit: Payer: Self-pay | Admitting: Cardiology

## 2015-07-11 ENCOUNTER — Other Ambulatory Visit: Payer: Self-pay | Admitting: Family Medicine

## 2015-07-17 ENCOUNTER — Other Ambulatory Visit: Payer: Self-pay | Admitting: Family Medicine

## 2015-08-12 ENCOUNTER — Other Ambulatory Visit: Payer: Self-pay | Admitting: Family Medicine

## 2015-08-13 ENCOUNTER — Other Ambulatory Visit: Payer: Self-pay | Admitting: Family Medicine

## 2015-08-13 NOTE — Telephone Encounter (Signed)
Pt needs new rx hydrocodone °

## 2015-08-14 MED ORDER — HYDROCODONE-ACETAMINOPHEN 10-325 MG PO TABS: 1.0000 | ORAL_TABLET | Freq: Three times a day (TID) | ORAL | Status: DC | PRN

## 2015-08-14 NOTE — Telephone Encounter (Signed)
Refill OK

## 2015-08-14 NOTE — Telephone Encounter (Signed)
Pt notified Rx ready for pickup. Rx printed and signed by Dr. Burchette.  

## 2015-08-14 NOTE — Telephone Encounter (Signed)
Last filled: 05-17-2015 #90 Pending appt: 11-18-2015 Last visit: 05-17-2015 Please advise on refill

## 2015-09-09 ENCOUNTER — Other Ambulatory Visit: Payer: Self-pay | Admitting: Cardiology

## 2015-09-09 ENCOUNTER — Other Ambulatory Visit: Payer: Self-pay | Admitting: Family Medicine

## 2015-09-27 ENCOUNTER — Other Ambulatory Visit: Payer: Self-pay | Admitting: Family Medicine

## 2015-10-07 ENCOUNTER — Other Ambulatory Visit: Payer: Self-pay | Admitting: Family Medicine

## 2015-10-28 ENCOUNTER — Other Ambulatory Visit: Payer: Self-pay | Admitting: Family Medicine

## 2015-11-18 ENCOUNTER — Encounter: Payer: Self-pay | Admitting: Family Medicine

## 2015-11-18 ENCOUNTER — Ambulatory Visit (INDEPENDENT_AMBULATORY_CARE_PROVIDER_SITE_OTHER): Payer: Medicare Other | Admitting: Family Medicine

## 2015-11-18 VITALS — BP 120/78 | HR 65 | Temp 97.5°F | Ht 76.0 in | Wt 262.0 lb

## 2015-11-18 DIAGNOSIS — Z23 Encounter for immunization: Secondary | ICD-10-CM | POA: Diagnosis not present

## 2015-11-18 DIAGNOSIS — I2510 Atherosclerotic heart disease of native coronary artery without angina pectoris: Secondary | ICD-10-CM | POA: Diagnosis not present

## 2015-11-18 DIAGNOSIS — E785 Hyperlipidemia, unspecified: Secondary | ICD-10-CM

## 2015-11-18 DIAGNOSIS — N410 Acute prostatitis: Secondary | ICD-10-CM

## 2015-11-18 DIAGNOSIS — I10 Essential (primary) hypertension: Secondary | ICD-10-CM

## 2015-11-18 LAB — BASIC METABOLIC PANEL
BUN: 16 mg/dL (ref 6–23)
CALCIUM: 9.5 mg/dL (ref 8.4–10.5)
CO2: 32 meq/L (ref 19–32)
CREATININE: 1.01 mg/dL (ref 0.40–1.50)
Chloride: 102 mEq/L (ref 96–112)
GFR: 76.85 mL/min (ref >60.00)
Glucose, Bld: 111 mg/dL — ABNORMAL HIGH (ref 70–99)
POTASSIUM: 4.6 meq/L (ref 3.5–5.1)
Sodium: 140 mEq/L (ref 135–145)

## 2015-11-18 LAB — LIPID PANEL
CHOLESTEROL: 185 mg/dL (ref 0–200)
HDL: 36.2 mg/dL — AB (ref >39.00)
LDL Cholesterol: 111 mg/dL — ABNORMAL HIGH (ref 0–99)
NonHDL: 149.22
TRIGLYCERIDES: 189 mg/dL — AB (ref 0.0–149.0)
Total CHOL/HDL Ratio: 5
VLDL: 37.8 mg/dL (ref 0.0–40.0)

## 2015-11-18 LAB — HEPATIC FUNCTION PANEL
ALBUMIN: 4.4 g/dL (ref 3.5–5.2)
ALK PHOS: 64 U/L (ref 39–117)
ALT: 22 U/L (ref 0–53)
AST: 19 U/L (ref 0–37)
BILIRUBIN DIRECT: 0.1 mg/dL (ref 0.0–0.3)
TOTAL PROTEIN: 7.2 g/dL (ref 6.0–8.3)
Total Bilirubin: 0.7 mg/dL (ref 0.2–1.2)

## 2015-11-18 MED ORDER — HYDROCODONE-ACETAMINOPHEN 10-325 MG PO TABS: 1.0000 | ORAL_TABLET | Freq: Three times a day (TID) | ORAL | Status: DC | PRN

## 2015-11-18 MED ORDER — CIPROFLOXACIN HCL 500 MG PO TABS: 500.0000 mg | ORAL_TABLET | Freq: Two times a day (BID) | ORAL | Status: DC

## 2015-11-18 MED ORDER — ROSUVASTATIN CALCIUM 20 MG PO TABS: 20.0000 mg | ORAL_TABLET | Freq: Every day | ORAL | Status: DC

## 2015-11-18 NOTE — Progress Notes (Signed)
   Subjective:    Patient ID: Rodney Barnett, male    DOB: 1941/08/23, 75 y.o.   MRN: [id number]  HPI Patient seen for medical follow-up. He has history of CAD, hypertension, hyperlipidemia, and chronic back pain. He rarely takes oxycodone for that requesting refill. This usually lasts several months.  Medications reviewed. Compliant with all. No recent chest pains. Blood pressure been very well controlled. Takes pravastatin for hyperlipidemia. He is due for labs today.  Acute and new problem of slow urinary stream. Patient states he felt exactly the same way years ago when he had acute prostatitis. He denies any burning with urination. He's noticed some slight odor to his urine. No fever or chills. Previous PSA testing is been normal.  Past Medical History  Diagnosis Date  . LIPOMA 11/15/2009  . Mixed hyperlipidemia 12/27/2008  . ERECTILE DYSFUNCTION 12/27/2008  . Essential hypertension, benign 12/27/2008  . Coronary atherosclerosis of native coronary artery 10/07/2009    Multivessel, LVEF 55-60%, basal inferior hypokinesis, known graft disease  . RHINITIS 11/15/2009  . INSOMNIA 04/24/2010  . Chronic back pain   . Peripheral neuropathy    Past Surgical History  Procedure Laterality Date  . Coronary artery bypass graft  1994    LIMA to LAD, SVG to RCA, SVG to diagonal    reports that he quit smoking about 22 years ago. His smoking use included Cigarettes. He started smoking about 61 years ago. He has a 120 pack-year smoking history. He does not have any smokeless tobacco history on file. He reports that he does not drink alcohol or use illicit drugs. family history includes Cancer in his brother; Hypertension in his other. Allergies  Allergen Reactions  . Lipitor [Atorvastatin] Other (See Comments)    myalgia      Review of Systems  Constitutional: Negative for chills, appetite change, fatigue and unexpected weight change.  Eyes: Negative for visual disturbance.  Respiratory: Negative  for cough, chest tightness and shortness of breath.   Cardiovascular: Negative for chest pain, palpitations and leg swelling.  Endocrine: Negative for polydipsia and polyuria.  Genitourinary: Positive for difficulty urinating. Negative for hematuria.  Neurological: Negative for dizziness, syncope, weakness, light-headedness and headaches.       Objective:   Physical Exam  Constitutional: He is oriented to person, place, and time. He appears well-developed and well-nourished.  HENT:  Right Ear: External ear normal.  Left Ear: External ear normal.  Mouth/Throat: Oropharynx is clear and moist.  Eyes: Pupils are equal, round, and reactive to light.  Neck: Neck supple. No thyromegaly present.  Cardiovascular: Normal rate and regular rhythm.   Pulmonary/Chest: Effort normal and breath sounds normal. No respiratory distress. He has no wheezes. He has no rales.  Genitourinary:  Prostate is not enlarged but is diffusely tender to palpation. No prostate masses noted  Musculoskeletal: He exhibits no edema.  Neurological: He is alert and oriented to person, place, and time.          Assessment & Plan:  #1 dyslipidemia. Recheck lipid and hepatic panel. Continue pravastatin  #2 hypertension. Stable and at goal. Check basic metabolic panel. Continue current medications  #3 history of CAD. Denies any recent chest pain. Recommend continued follow-up with cardiology  #4 acute prostatitis. Cipro 500 mg twice a day for 10 days.  #5 health maintenance. Flu vaccine recommended and patient declines. Patient requesting shingles vaccine. No contraindications. This will be given.

## 2015-11-18 NOTE — Addendum Note (Signed)
Addended by: Elio Forget on: 11/18/2015 04:20 PM   Modules accepted: Orders, Medications

## 2015-11-18 NOTE — Patient Instructions (Signed)

## 2015-11-18 NOTE — Progress Notes (Signed)
Pre visit review using our clinic review tool, if applicable. No additional management support is needed unless otherwise documented below in the visit note. 

## 2015-11-18 NOTE — Addendum Note (Signed)
Addended by: Elio Forget on: 11/18/2015 09:22 AM   Modules accepted: Orders

## 2015-11-18 NOTE — Addendum Note (Signed)
Addended by: Elio Forget on: 11/18/2015 09:23 AM   Modules accepted: Medications

## 2015-11-19 ENCOUNTER — Encounter: Payer: Self-pay | Admitting: Gastroenterology

## 2015-11-25 ENCOUNTER — Other Ambulatory Visit: Payer: Self-pay | Admitting: Cardiology

## 2016-01-31 ENCOUNTER — Ambulatory Visit (INDEPENDENT_AMBULATORY_CARE_PROVIDER_SITE_OTHER): Payer: Medicare Other | Admitting: Cardiology

## 2016-01-31 ENCOUNTER — Encounter: Payer: Self-pay | Admitting: Cardiology

## 2016-01-31 VITALS — BP 128/62 | HR 59 | Ht 76.0 in | Wt 266.0 lb

## 2016-01-31 DIAGNOSIS — I2510 Atherosclerotic heart disease of native coronary artery without angina pectoris: Secondary | ICD-10-CM

## 2016-01-31 DIAGNOSIS — I10 Essential (primary) hypertension: Secondary | ICD-10-CM | POA: Diagnosis not present

## 2016-01-31 DIAGNOSIS — E782 Mixed hyperlipidemia: Secondary | ICD-10-CM

## 2016-01-31 NOTE — Patient Instructions (Addendum)
Your physician wants you to follow-up in: 1 year with Dr McDowell You will receive a reminder letter in the mail two months in advance. If you don't receive a letter, please call our office to schedule the follow-up appointment.    Your physician recommends that you continue on your current medications as directed. Please refer to the Current Medication list given to you today.     If you need a refill on your cardiac medications before your next appointment, please call your pharmacy.     Thank you for choosing [facility] !        

## 2016-01-31 NOTE — Progress Notes (Signed)
Cardiology Office Note  Date: 01/31/2016   ID: Rodney Barnett 08/31/1941, MRN [id number]  PCP: Rodney Post, MD  Primary Cardiologist: Rodney Lesches, MD   Chief Complaint  Patient presents with  . Coronary Artery Disease    History of Present Illness: Rodney Barnett is a 75 y.o. male last seen in April 2016. He presents for a routine follow-up visit. He has not had any significant angina symptoms, reports NYHA class II dyspnea. He is limited by arthritis and chronic lower back pain. [place] He does tell me that his wife of 71 years passed away about 6 months ago.  I reviewed his medications. Cardiac regimen includes aspirin, Plavix, Imdur, lisinopril, Lopressor, and now Crestor instead of Pravachol. He has follow-up lab work pending with Dr. Elease Barnett. Last LDL was 111.  Blood pressure today is adequately controlled. He is not reporting any dizziness or palpitations.  We continue conservative management and observation with known CAD and prior CABG. He has documented graft disease and has done very well overall.  Past Medical History  Diagnosis Date  . LIPOMA 11/15/2009  . Mixed hyperlipidemia 12/27/2008  . ERECTILE DYSFUNCTION 12/27/2008  . Essential hypertension, benign 12/27/2008  . Coronary atherosclerosis of native coronary artery 10/07/2009    Multivessel, LVEF 55-60%, basal inferior hypokinesis, known graft disease  . RHINITIS 11/15/2009  . INSOMNIA 04/24/2010  . Chronic back pain   . Peripheral neuropathy Rodney Barnett)     Past Surgical History  Procedure Laterality Date  . Coronary artery bypass graft  1994    LIMA to LAD, SVG to RCA, SVG to diagonal    Current Outpatient Prescriptions  Medication Sig Dispense Refill  . aspirin 81 MG tablet Take 81 mg by mouth daily.      . ciprofloxacin (CIPRO) 500 MG tablet Take 1 tablet (500 mg total) by mouth 2 (two) times daily. 20 tablet 0  . clopidogrel (PLAVIX) 75 MG tablet TAKE 1 TABLET ONCE DAILY. 30  tablet 2  . gabapentin (NEURONTIN) 300 MG capsule TAKE (2) CAPSULES BY MOUTH THREE TIMES DAILY. 180 capsule 2  . HYDROcodone-acetaminophen (NORCO) 10-325 MG tablet Take 1 tablet by mouth every 8 (eight) hours as needed. 90 tablet 0  . isosorbide mononitrate (IMDUR) 30 MG 24 hr tablet TAKE 1 TABLET ONCE DAILY. 30 tablet 11  . lisinopril (PRINIVIL,ZESTRIL) 10 MG tablet TAKE (1) TABLET BY MOUTH ONCE DAILY. 30 tablet 6  . metoprolol tartrate (LOPRESSOR) 25 MG tablet TAKE (1) TABLET TWICE DAILY. 60 tablet 11  . nitroGLYCERIN (NITROSTAT) 0.4 MG SL tablet Place 1 tablet (0.4 mg total) under the tongue every 5 (five) minutes as needed. 20 tablet 0  . rosuvastatin (CRESTOR) 20 MG tablet Take 1 tablet (20 mg total) by mouth daily. 90 tablet 2   No current facility-administered medications for this visit.   Allergies:  Lipitor   Social History: The patient  reports that he quit smoking about 23 years ago. His smoking use included Cigarettes. He started smoking about 61 years ago. He has a 120 pack-year smoking history. He does not have any smokeless tobacco history on file. He reports that he does not drink alcohol or use illicit drugs.   Family History: The patient's family history includes Cancer in his brother; Hypertension in his other.   ROS:  Please see the history of present illness. Otherwise, complete review of systems is positive for peripheral neuropathy.  All other systems are reviewed and negative.  Physical Exam: VS:  BP 128/62 mmHg  Pulse 59  Ht 6\' 4"  (1.93 m)  Wt 266 lb (120.657 kg)  BMI 32.39 kg/m2  SpO2 90%, BMI Body mass index is 32.39 kg/(m^2).  Wt Readings from Last 3 Encounters:  01/31/16 266 lb (120.657 kg)  11/18/15 262 lb (118.842 kg)  05/17/15 253 lb (114.76 kg)    Overweight male in no acute distress.  HEENT: Conjunctiva and lids normal, oropharynx clear.  Neck: Supple, no elevated jugular venous pressure, no thyromegaly.  Lungs: Diminished breath sounds,  nonlabored.  Cardiac: Regular rate and rhythm, no S3 gallop. Soft S4.  Abdomen: Soft, nontender, bowel sounds present. No bruit.  Extremities: Trace ankle edema. Distal pulses 1-2+.  Skin: Warm and dry.  Musculoskeletal: No kyphosis.  Neuropsychiatric: Alert and oriented x3, affect appropriate.  ECG: I personally reviewed the prior tracing from 01/28/2015 which showed sinus bradycardia.  Recent Labwork: 11/18/2015: ALT 22; AST 19; BUN 16; Creatinine, Ser 1.01; Potassium 4.6; Sodium 140     Component Value Date/Time   CHOL 185 11/18/2015 0901   TRIG 189.0* 11/18/2015 0901   HDL 36.20* 11/18/2015 0901   CHOLHDL 5 11/18/2015 0901   VLDL 37.8 11/18/2015 0901   LDLCALC 111* 11/18/2015 0901   LDLDIRECT 114.8 06/07/2012 [id number]    Other Studies Reviewed Today:  Cardiac catheterization in 2010 showed occluded SVG to RCA and SVG to diagonal, although patent LIMA to LAD. The RCA was occluded with left to right collaterals and the circumflex was nonobstructive. He has been managed medically over time.  Assessment and Plan:  1. Multivessel CAD status Barnett CABG with documented graft disease as outlined above. He has done very well symptomatically speaking on medical therapy and we will continue conservative management and observation for now. I reviewed his ECG today which shows sinus rhythm with nonspecific T-wave changes.  2. Essential hypertension, blood pressure is adequately controlled today.  3. Hyperlipidemia, statin therapy switched from Pravachol to Crestor. Last LDL was 111. He has lab work pending with Dr. Elease Barnett.  Current medicines were reviewed with the patient today.   Orders Placed This Encounter  Procedures  . EKG 12-Lead    Disposition: FU with me in 1 year.   Signed, Satira Sark, MD, [facility] 01/31/2016 11:52 AM    [facility] [facility] [place]. [address], [place], [place] [address] Phone: [phone]; Fax: [phone]

## 2016-02-11 ENCOUNTER — Telehealth: Payer: Self-pay | Admitting: Family Medicine

## 2016-02-11 DIAGNOSIS — E785 Hyperlipidemia, unspecified: Secondary | ICD-10-CM

## 2016-02-11 DIAGNOSIS — Z125 Encounter for screening for malignant neoplasm of prostate: Secondary | ICD-10-CM

## 2016-02-11 NOTE — Telephone Encounter (Signed)
Appt are UTD Last refill #90 11/18/2015 Please advise He has a f/u in August, okay to recheck lipids and psa? Last PSA was 2015 and cholesterol was 11/18/15

## 2016-02-11 NOTE — Telephone Encounter (Signed)
Pt need new Rx for Hydrocodone.  Pt lab orders for PSA and cholesterol.

## 2016-02-11 NOTE — Telephone Encounter (Signed)
OK to order labs and refill med once.

## 2016-02-12 MED ORDER — HYDROCODONE-ACETAMINOPHEN 10-325 MG PO TABS: 1.0000 | ORAL_TABLET | Freq: Three times a day (TID) | ORAL | Status: DC | PRN

## 2016-02-12 NOTE — Telephone Encounter (Signed)
Script is up front for pick up. Pt is aware.

## 2016-02-12 NOTE — Telephone Encounter (Signed)
Labs entered. RX printed for signature.

## 2016-02-13 ENCOUNTER — Other Ambulatory Visit (INDEPENDENT_AMBULATORY_CARE_PROVIDER_SITE_OTHER): Payer: Medicare Other

## 2016-02-13 DIAGNOSIS — E785 Hyperlipidemia, unspecified: Secondary | ICD-10-CM

## 2016-02-13 DIAGNOSIS — Z125 Encounter for screening for malignant neoplasm of prostate: Secondary | ICD-10-CM | POA: Diagnosis not present

## 2016-02-13 LAB — PSA: PSA: 0.59 ng/mL (ref 0.10–4.00)

## 2016-02-13 LAB — LIPID PANEL
Cholesterol: 125 mg/dL (ref 0–200)
HDL: 30.5 mg/dL — ABNORMAL LOW
LDL Cholesterol: 64 mg/dL (ref 0–99)
NonHDL: 94.1
Total CHOL/HDL Ratio: 4
Triglycerides: 150 mg/dL — ABNORMAL HIGH (ref 0.0–149.0)
VLDL: 30 mg/dL (ref 0.0–40.0)

## 2016-02-25 ENCOUNTER — Other Ambulatory Visit: Payer: Self-pay | Admitting: Cardiology

## 2016-02-25 ENCOUNTER — Other Ambulatory Visit: Payer: Self-pay | Admitting: Family Medicine

## 2016-03-12 ENCOUNTER — Other Ambulatory Visit: Payer: Self-pay | Admitting: Cardiology

## 2016-05-06 ENCOUNTER — Other Ambulatory Visit: Payer: Self-pay | Admitting: Cardiology

## 2016-05-06 ENCOUNTER — Other Ambulatory Visit: Payer: Self-pay | Admitting: Family Medicine

## 2016-05-18 ENCOUNTER — Encounter: Payer: Self-pay | Admitting: Family Medicine

## 2016-05-18 ENCOUNTER — Ambulatory Visit (INDEPENDENT_AMBULATORY_CARE_PROVIDER_SITE_OTHER): Payer: Medicare Other | Admitting: Family Medicine

## 2016-05-18 VITALS — BP 120/78 | HR 66 | Temp 98.0°F | Ht 76.0 in | Wt 265.0 lb

## 2016-05-18 DIAGNOSIS — G6289 Other specified polyneuropathies: Secondary | ICD-10-CM

## 2016-05-18 DIAGNOSIS — G8929 Other chronic pain: Secondary | ICD-10-CM

## 2016-05-18 DIAGNOSIS — I10 Essential (primary) hypertension: Secondary | ICD-10-CM | POA: Diagnosis not present

## 2016-05-18 DIAGNOSIS — M549 Dorsalgia, unspecified: Secondary | ICD-10-CM | POA: Diagnosis not present

## 2016-05-18 DIAGNOSIS — E785 Hyperlipidemia, unspecified: Secondary | ICD-10-CM | POA: Diagnosis not present

## 2016-05-18 MED ORDER — HYDROCODONE-ACETAMINOPHEN 10-325 MG PO TABS: 1.0000 | ORAL_TABLET | Freq: Three times a day (TID) | ORAL | 0 refills | Status: DC | PRN

## 2016-05-18 NOTE — Progress Notes (Signed)
Subjective:     Patient ID: Rodney Barnett, male   DOB: Jul 12, 1941, 75 y.o.   MRN: [id number]  HPI Here for medical follow-up  History of CAD. He has hyperlipidemia and suboptimal LDL control recently and we switched to Crestor and follow-up LDL cholesterol 64 which is greatly improved. He is tolerating without any side effects. No recent chest pains.  Hypertension treated with lisinopril and metoprolol. Blood pressures been very well controlled. No dizziness. No headaches. He has mild peripheral edema which is chronic and stable. No orthopnea.  Chronic low back pain. Pain is frequently 7-8 out of 10 and tends to keep him awake at night when he does not take medication. He is taken basically one hydrocodone at night for several years which controls his pain well area no history of issues. No constipation issues. He has chronic neuropathy involving feet. No history of diabetes. No history of B12 deficiency. Takes gabapentin but still has breakthrough burning frequently.  Past Medical History:  Diagnosis Date  . Chronic back pain   . Coronary atherosclerosis of native coronary artery 10/07/2009   Multivessel, LVEF 55-60%, basal inferior hypokinesis, known graft disease  . ERECTILE DYSFUNCTION 12/27/2008  . Essential hypertension, benign 12/27/2008  . INSOMNIA 04/24/2010  . LIPOMA 11/15/2009  . Mixed hyperlipidemia 12/27/2008  . Peripheral neuropathy ([place])   . RHINITIS 11/15/2009   Past Surgical History:  Procedure Laterality Date  . CORONARY ARTERY BYPASS GRAFT  1994   LIMA to LAD, SVG to RCA, SVG to diagonal    reports that he quit smoking about 23 years ago. His smoking use included Cigarettes. He started smoking about 61 years ago. He has a 120.00 pack-year smoking history. He does not have any smokeless tobacco history on file. He reports that he does not drink alcohol or use drugs. family history includes Cancer in his brother; Hypertension in his other. Allergies  Allergen Reactions  .  Lipitor [Atorvastatin] Other (See Comments)    myalgia     Review of Systems  Constitutional: Negative for fatigue.  Eyes: Negative for visual disturbance.  Respiratory: Negative for cough, chest tightness and shortness of breath.   Cardiovascular: Negative for chest pain and palpitations.  Endocrine: Negative for polydipsia and polyuria.  Neurological: Negative for dizziness, syncope, weakness, light-headedness and headaches.       Objective:   Physical Exam  Constitutional: He is oriented to person, place, and time. He appears well-developed and well-nourished.  Cardiovascular: Normal rate and regular rhythm.   Pulmonary/Chest: Effort normal and breath sounds normal. No respiratory distress. He has no wheezes. He has no rales.  Musculoskeletal:  Trace edema feet ankles bilaterally Both feet are warm to touch. Good distal pulses No localized tenderness  Neurological: He is alert and oriented to person, place, and time.       Assessment:     #1 hypertension. Stable and at goal  #2 hyperlipidemia in a patient with history of CAD. Lipids improved with change to Crestor.  #3 chronic low back pain. Functionally, this has limited him with difficulty sleeping and reduction in some day to day activities.Marland Kitchen He takes just one hydrocodone daily which helps at night.   #4 history of chronic neuropathy. Still has breakthrough discomfort on gabapentin.   [name] MD [facility] [place]:     -We discussed possible change to Lyrica but at this point he does not wish to make any changes -Continue Crestor -Provider for  annual flu vaccination -Continue current regular medications -1 refill given for hydrocodone No. 90  [name] MD [facility] at [facility]

## 2016-05-18 NOTE — Progress Notes (Signed)
Pre visit review using our clinic review tool, if applicable. No additional management support is needed unless otherwise documented below in the visit note. 

## 2016-07-06 ENCOUNTER — Other Ambulatory Visit: Payer: Self-pay | Admitting: Family Medicine

## 2016-08-05 ENCOUNTER — Other Ambulatory Visit: Payer: Self-pay | Admitting: Cardiology

## 2016-08-17 ENCOUNTER — Other Ambulatory Visit: Payer: Self-pay

## 2016-08-17 ENCOUNTER — Telehealth: Payer: Self-pay | Admitting: Family Medicine

## 2016-08-17 MED ORDER — HYDROCODONE-ACETAMINOPHEN 10-325 MG PO TABS: 1.0000 | ORAL_TABLET | Freq: Three times a day (TID) | ORAL | 0 refills | Status: DC | PRN

## 2016-08-17 NOTE — Telephone Encounter (Signed)
Refill OK

## 2016-08-17 NOTE — Telephone Encounter (Signed)
° ° °  Pt request refill of the following: ° ° °HYDROcodone-acetaminophen (NORCO) 10-325 MG tablet ° ° °Phamacy: °

## 2016-09-01 ENCOUNTER — Other Ambulatory Visit: Payer: Self-pay | Admitting: Cardiology

## 2016-09-30 ENCOUNTER — Other Ambulatory Visit: Payer: Self-pay | Admitting: Cardiology

## 2016-09-30 ENCOUNTER — Other Ambulatory Visit: Payer: Self-pay | Admitting: Family Medicine

## 2016-10-19 ENCOUNTER — Other Ambulatory Visit: Payer: Self-pay | Admitting: Family Medicine

## 2016-11-17 ENCOUNTER — Ambulatory Visit (INDEPENDENT_AMBULATORY_CARE_PROVIDER_SITE_OTHER): Payer: Medicare Other | Admitting: Family Medicine

## 2016-11-17 ENCOUNTER — Encounter: Payer: Self-pay | Admitting: Family Medicine

## 2016-11-17 VITALS — BP 130/72 | HR 64 | Ht 76.0 in | Wt 269.0 lb

## 2016-11-17 DIAGNOSIS — M545 Low back pain, unspecified: Secondary | ICD-10-CM

## 2016-11-17 DIAGNOSIS — G8929 Other chronic pain: Secondary | ICD-10-CM

## 2016-11-17 DIAGNOSIS — I10 Essential (primary) hypertension: Secondary | ICD-10-CM | POA: Diagnosis not present

## 2016-11-17 DIAGNOSIS — E785 Hyperlipidemia, unspecified: Secondary | ICD-10-CM

## 2016-11-17 DIAGNOSIS — I2510 Atherosclerotic heart disease of native coronary artery without angina pectoris: Secondary | ICD-10-CM

## 2016-11-17 MED ORDER — HYDROCODONE-ACETAMINOPHEN 10-325 MG PO TABS: 1.0000 | ORAL_TABLET | Freq: Three times a day (TID) | ORAL | 0 refills | Status: DC | PRN

## 2016-11-17 NOTE — Progress Notes (Signed)
Subjective:     Patient ID: Rodney Barnett, male   DOB: 1941-07-28, 76 y.o.   MRN: [id number]  HPI Patient seen for medical follow-up. He has history of CAD going back about 24 years. No recent chest pains. He has hypertension, peripheral neuropathy, obesity, dyslipidemia. Medications reviewed. Compliant with all. Neuropathy symptoms are stable. No history of diabetes. He has chronic low back pain and has for years taken very low-dose hydrocodone sparingly mostly at night. He sometimes breaks pill in half and takes one half of the morning one half at night. No history of misuse. We obtain lipids last May which were stable. No recent falls. He has great difficulty sleeping when he does not take this at night. He does not get relief with regular Tylenol.  Wife passed away little over year ago. He had some initial depression seems to be coping well now.  Past Medical History:  Diagnosis Date  . Chronic back pain   . Coronary atherosclerosis of native coronary artery 10/07/2009   Multivessel, LVEF 55-60%, basal inferior hypokinesis, known graft disease  . ERECTILE DYSFUNCTION 12/27/2008  . Essential hypertension, benign 12/27/2008  . INSOMNIA 04/24/2010  . LIPOMA 11/15/2009  . Mixed hyperlipidemia 12/27/2008  . Peripheral neuropathy ([place])   . RHINITIS 11/15/2009   Past Surgical History:  Procedure Laterality Date  . CORONARY ARTERY BYPASS GRAFT  1994   LIMA to LAD, SVG to RCA, SVG to diagonal    reports that he quit smoking about 23 years ago. His smoking use included Cigarettes. He started smoking about 62 years ago. He has a 120.00 pack-year smoking history. He has never used smokeless tobacco. He reports that he does not drink alcohol or use drugs. family history includes Cancer in his brother; Hypertension in his other. Allergies  Allergen Reactions  . Lipitor [Atorvastatin] Other (See Comments)    myalgia     Review of Systems  Constitutional: Negative for fatigue.  Eyes: Negative for  visual disturbance.  Respiratory: Negative for cough, chest tightness and shortness of breath.   Cardiovascular: Negative for chest pain, palpitations and leg swelling.  Musculoskeletal: Positive for arthralgias and back pain.  Neurological: Negative for dizziness, syncope, weakness, light-headedness and headaches.       Objective:   Physical Exam  Constitutional: He is oriented to person, place, and time. He appears well-developed and well-nourished.  HENT:  Right Ear: External ear normal.  Left Ear: External ear normal.  Mouth/Throat: Oropharynx is clear and moist.  Eyes: Pupils are equal, round, and reactive to light.  Neck: Neck supple. No thyromegaly present.  No carotid bruits  Cardiovascular: Normal rate and regular rhythm.   Pulmonary/Chest: Effort normal and breath sounds normal. No respiratory distress. He has no wheezes. He has no rales.  Musculoskeletal: He exhibits no edema.  Neurological: He is alert and oriented to person, place, and time.       Assessment:     #1 history of CAD-symptomatically stable  #2 history of chronic low back pain stable  #3 dyslipidemia  #4 hypertension stable and at goal    Plan:     -Patient declines flu vaccination -Recommend schedule follow-up with cardiology -Plan follow-up fasting lipids in 6 months -Refill hydrocodone given which he takes at night for his chronic low back pain  [name] MD [facility] at [facility]

## 2016-11-17 NOTE — Progress Notes (Signed)
Pre visit review using our clinic review tool, if applicable. No additional management support is needed unless otherwise documented below in the visit note. 

## 2016-12-31 ENCOUNTER — Other Ambulatory Visit: Payer: Self-pay | Admitting: Family Medicine

## 2017-02-01 ENCOUNTER — Other Ambulatory Visit: Payer: Self-pay | Admitting: Family Medicine

## 2017-02-09 NOTE — Progress Notes (Signed)
Cardiology Office Note  Date: 02/10/2017   ID: Rodney Barnett, DOB 03/02/1941, MRN [id number]  PCP: [name], MD  Primary Cardiologist: Rozann Lesches, MD   Chief Complaint  Patient presents with  . Coronary Artery Disease    History of Present Illness: Rodney Barnett is a 76 y.o. male last seen in April 2017. He presents for a routine follow-up visit. States that he has had no angina symptoms on medical therapy. [place] with a grandson and his wife living next door. Patient's wife passed away last year. States that he is doing reasonably well.  We continue conservative management and observation with known CAD and prior CABG. He has documented graft disease and has done very well overall. He has not been inclined to pursue ischemic testing, we have discussed warning signs and symptoms.  Current cardiac medications include aspirin, Plavix, Imdur, lisinopril, Lopressor, Crestor, and as needed nitroglycerin.  Past Medical History:  Diagnosis Date  . Chronic back pain   . Chronic rhinitis   . Coronary atherosclerosis of native coronary artery    Multivessel, LVEF 55-60%, basal inferior hypokinesis, known graft disease  . Erectile dysfunction   . Essential hypertension, benign   . Insomnia   . Lipoma   . Mixed hyperlipidemia   . Peripheral neuropathy     Past Surgical History:  Procedure Laterality Date  . CORONARY ARTERY BYPASS GRAFT  1994   LIMA to LAD, SVG to RCA, SVG to diagonal    Current Outpatient Prescriptions  Medication Sig Dispense Refill  . aspirin 81 MG tablet Take 81 mg by mouth daily.      . clopidogrel (PLAVIX) 75 MG tablet TAKE 1 TABLET ONCE DAILY. 30 tablet 11  . gabapentin (NEURONTIN) 300 MG capsule TAKE (2) CAPSULES BY MOUTH THREE TIMES DAILY. 180 capsule 5  . HYDROcodone-acetaminophen (NORCO) 10-325 MG tablet Take 1 tablet by mouth every 8 (eight) hours as needed. 90 tablet 0  . isosorbide mononitrate (IMDUR) 30 MG 24 hr tablet TAKE  1 TABLET ONCE DAILY. 30 tablet 11  . lisinopril (PRINIVIL,ZESTRIL) 10 MG tablet TAKE (1) TABLET BY MOUTH ONCE DAILY. 30 tablet 6  . metoprolol tartrate (LOPRESSOR) 25 MG tablet TAKE (1) TABLET TWICE DAILY. 60 tablet 5  . nitroGLYCERIN (NITROSTAT) 0.4 MG SL tablet Place 1 tablet (0.4 mg total) under the tongue every 5 (five) minutes as needed. 20 tablet 0  . rosuvastatin (CRESTOR) 20 MG tablet TAKE (1) TABLET BY MOUTH ONCE DAILY. 90 tablet 1   No current facility-administered medications for this visit.    Allergies:  Lipitor [atorvastatin]   Social History: The patient  reports that he quit smoking about 24 years ago. His smoking use included Cigarettes. He started smoking about 62 years ago. He has a 120.00 pack-year smoking history. He has never used smokeless tobacco. He reports that he does not drink alcohol or use drugs.   ROS:  Please see the history of present illness. Otherwise, complete review of systems is positive for none.  All other systems are reviewed and negative.   Physical Exam: VS:  BP 130/78   Pulse 65   Ht 6\' 4"  (1.93 m)   Wt 266 lb (120.7 kg)   SpO2 93%   BMI 32.38 kg/m , BMI Body mass index is 32.38 kg/m.  Wt Readings from Last 3 Encounters:  02/10/17 266 lb (120.7 kg)  11/17/16 269 lb (122 kg)  05/18/16 265 lb (120.2 kg)  Overweight male in no acute distress.  HEENT: Conjunctiva and lids normal, oropharynx clear.  Neck: Supple, no elevated jugular venous pressure, no thyromegaly.  Lungs: Diminished breath sounds, nonlabored.  Cardiac: Regular rate and rhythm, no S3 gallop. Soft S4. No significant murmur. Abdomen: Soft, nontender, bowel sounds present. No bruit.  Extremities: Trace ankle edema. Distal pulses 1-2+.  Skin: Warm and dry.  Musculoskeletal: No kyphosis.  Neuropsychiatric: Alert and oriented x3, affect appropriate.  ECG: I personally reviewed the tracing from 01/31/2016 shows sinus rhythm with nonspecific T-wave changes.   Recent  Labwork:    Component Value Date/Time   CHOL 125 02/13/2016 0816   TRIG 150.0 (H) 02/13/2016 0816   HDL 30.50 (L) 02/13/2016 0816   CHOLHDL 4 02/13/2016 0816   VLDL 30.0 02/13/2016 0816   LDLCALC 64 02/13/2016 0816   LDLDIRECT 114.8 06/07/2012 6283    Other Studies Reviewed Today:  Cardiac catheterization in 2010 showed occluded SVG to RCA and SVG to diagonal, although patent LIMA to LAD. The RCA was occluded with left to right collaterals and the circumflex was nonobstructive. He has been managed medically over time.  Assessment and Plan:  1. Symptomatically stable CAD status post CABG with known graft disease. He is doing well on current medical regimen. Refill provided for fresh bottle of nitroglycerin. Continue with observation. We have discussed warning signs and symptoms.  2. Essential hypertension, on lisinopril and Lopressor. Blood pressure control is reasonable today.  3. Hyperlipidemia, last LDL 64 on Crestor.  Current medicines were reviewed with the patient today.  Disposition: Follow-up in one year, sooner if needed.  Signed, Satira Sark, MD, [facility] 02/10/2017 2:07 PM    [place] at [facility] [address] [place], [place] [address] Phone: [phone]; Fax: [phone]

## 2017-02-10 ENCOUNTER — Encounter: Payer: Self-pay | Admitting: Cardiology

## 2017-02-10 ENCOUNTER — Ambulatory Visit (INDEPENDENT_AMBULATORY_CARE_PROVIDER_SITE_OTHER): Payer: Medicare Other | Admitting: Cardiology

## 2017-02-10 VITALS — BP 130/78 | HR 65 | Ht 76.0 in | Wt 266.0 lb

## 2017-02-10 DIAGNOSIS — I10 Essential (primary) hypertension: Secondary | ICD-10-CM | POA: Diagnosis not present

## 2017-02-10 DIAGNOSIS — E782 Mixed hyperlipidemia: Secondary | ICD-10-CM | POA: Diagnosis not present

## 2017-02-10 DIAGNOSIS — I2510 Atherosclerotic heart disease of native coronary artery without angina pectoris: Secondary | ICD-10-CM | POA: Diagnosis not present

## 2017-02-10 MED ORDER — NITROGLYCERIN 0.4 MG SL SUBL: 0.4000 mg | SUBLINGUAL_TABLET | SUBLINGUAL | 3 refills | Status: DC | PRN

## 2017-02-10 NOTE — Patient Instructions (Signed)

## 2017-02-12 ENCOUNTER — Telehealth: Payer: Self-pay | Admitting: Family Medicine

## 2017-02-12 NOTE — Telephone Encounter (Signed)
Pt calling to check the status of the Rx   Pt is aware of 3 business days for refills and someone will call when ready for pick up.

## 2017-02-12 NOTE — Telephone Encounter (Signed)
° ° °  Pt request refill of the following: ° ° °HYDROcodone-acetaminophen (NORCO) 10-325 MG tablet ° ° °Phamacy: °

## 2017-02-12 NOTE — Telephone Encounter (Signed)
Last office visit and refill was 11/17/16.  Okay to fill?

## 2017-02-13 NOTE — Telephone Encounter (Signed)
We will need to discuss new regulations/laws regarding opioid use with him .   May refill #30 until follow up.

## 2017-02-15 MED ORDER — HYDROCODONE-ACETAMINOPHEN 10-325 MG PO TABS: 1.0000 | ORAL_TABLET | Freq: Three times a day (TID) | ORAL | 0 refills | Status: DC | PRN

## 2017-02-15 NOTE — Telephone Encounter (Signed)
Patient is aware 

## 2017-02-15 NOTE — Telephone Encounter (Signed)
attempted to call patient but unable to leave a message.  "mailbox not set up yet".

## 2017-02-16 ENCOUNTER — Other Ambulatory Visit: Payer: Self-pay | Admitting: Cardiology

## 2017-03-15 ENCOUNTER — Ambulatory Visit: Payer: Medicare Other | Admitting: Family Medicine

## 2017-04-02 ENCOUNTER — Other Ambulatory Visit: Payer: Self-pay | Admitting: Family Medicine

## 2017-04-02 ENCOUNTER — Other Ambulatory Visit: Payer: Self-pay | Admitting: Cardiology

## 2017-04-22 ENCOUNTER — Other Ambulatory Visit: Payer: Self-pay

## 2017-04-22 MED ORDER — CLOPIDOGREL BISULFATE 75 MG PO TABS: 75.0000 mg | ORAL_TABLET | Freq: Every day | ORAL | 6 refills | Status: DC

## 2017-04-22 NOTE — Telephone Encounter (Signed)
Refilled plavix per fax request 

## 2017-05-17 ENCOUNTER — Encounter: Payer: Self-pay | Admitting: Family Medicine

## 2017-05-17 ENCOUNTER — Ambulatory Visit (INDEPENDENT_AMBULATORY_CARE_PROVIDER_SITE_OTHER): Payer: Medicare Other | Admitting: Family Medicine

## 2017-05-17 ENCOUNTER — Other Ambulatory Visit: Payer: Self-pay | Admitting: Family Medicine

## 2017-05-17 VITALS — BP 110/80 | HR 60 | Temp 97.9°F | Wt 266.8 lb

## 2017-05-17 DIAGNOSIS — E785 Hyperlipidemia, unspecified: Secondary | ICD-10-CM

## 2017-05-17 DIAGNOSIS — I10 Essential (primary) hypertension: Secondary | ICD-10-CM | POA: Diagnosis not present

## 2017-05-17 DIAGNOSIS — M545 Low back pain, unspecified: Secondary | ICD-10-CM

## 2017-05-17 DIAGNOSIS — G8929 Other chronic pain: Secondary | ICD-10-CM

## 2017-05-17 DIAGNOSIS — Z125 Encounter for screening for malignant neoplasm of prostate: Secondary | ICD-10-CM

## 2017-05-17 DIAGNOSIS — I2510 Atherosclerotic heart disease of native coronary artery without angina pectoris: Secondary | ICD-10-CM

## 2017-05-17 LAB — LIPID PANEL
CHOLESTEROL: 119 mg/dL (ref 0–200)
HDL: 33.3 mg/dL — ABNORMAL LOW (ref >39.00)
LDL CALC: 61 mg/dL (ref 0–99)
NONHDL: 85.7
Total CHOL/HDL Ratio: 4
Triglycerides: 123 mg/dL (ref 0.0–149.0)
VLDL: 24.6 mg/dL (ref 0.0–40.0)

## 2017-05-17 LAB — HEPATIC FUNCTION PANEL
ALBUMIN: 4.5 g/dL (ref 3.5–5.2)
ALK PHOS: 58 U/L (ref 39–117)
ALT: 20 U/L (ref 0–53)
AST: 19 U/L (ref 0–37)
BILIRUBIN DIRECT: 0.2 mg/dL (ref 0.0–0.3)
Total Bilirubin: 1 mg/dL (ref 0.2–1.2)
Total Protein: 6.9 g/dL (ref 6.0–8.3)

## 2017-05-17 LAB — BASIC METABOLIC PANEL
BUN: 12 mg/dL (ref 6–23)
CO2: 27 meq/L (ref 19–32)
Calcium: 9.3 mg/dL (ref 8.4–10.5)
Chloride: 104 mEq/L (ref 96–112)
Creatinine, Ser: 0.99 mg/dL (ref 0.40–1.50)
GFR: 78.33 mL/min (ref >60.00)
GLUCOSE: 121 mg/dL — AB (ref 70–99)
POTASSIUM: 4.3 meq/L (ref 3.5–5.1)
SODIUM: 139 meq/L (ref 135–145)

## 2017-05-17 LAB — PSA: PSA: 0.76 ng/mL (ref 0.10–4.00)

## 2017-05-17 MED ORDER — HYDROCODONE-ACETAMINOPHEN 10-325 MG PO TABS: 1.0000 | ORAL_TABLET | Freq: Four times a day (QID) | ORAL | 0 refills | Status: DC | PRN

## 2017-05-17 NOTE — Progress Notes (Signed)
Subjective:     Patient ID: Rodney Barnett, male   DOB: [birth year], 76 y.o.   MRN: [id number]  HPI Patient seen for medical follow-up. He has chronic problems including history of CAD, hyperlipidemia, hypertension, obesity, chronic back pain, and history of smoking. Quit several years ago. We have previously ordered abdominal aortic aneurysm screening and he declines. He takes hydrocodone infrequently at night for severe back pain. No regular use.  Other medications reviewed. Compliant with all. No recent chest pains. No recent falls. Denies any depression issues. Patient requests PSA testing. Takes gabapentin for neuropathy and still has breakthrough symptoms frequently.  Past Medical History:  Diagnosis Date  . Chronic back pain   . Chronic rhinitis   . Coronary atherosclerosis of native coronary artery    Multivessel, LVEF 55-60%, basal inferior hypokinesis, known graft disease  . Erectile dysfunction   . Essential hypertension, benign   . Insomnia   . Lipoma   . Mixed hyperlipidemia   . Peripheral neuropathy    Past Surgical History:  Procedure Laterality Date  . CORONARY ARTERY BYPASS GRAFT  1994   LIMA to LAD, SVG to RCA, SVG to diagonal    reports that he quit smoking about 24 years ago. His smoking use included Cigarettes. He started smoking about 62 years ago. He has a 120.00 pack-year smoking history. He has never used smokeless tobacco. He reports that he does not drink alcohol or use drugs. family history includes Cancer in his brother; Hypertension in his other. Allergies  Allergen Reactions  . Lipitor [Atorvastatin] Other (See Comments)    myalgia     Review of Systems  Constitutional: Negative for fatigue.  Eyes: Negative for visual disturbance.  Respiratory: Negative for cough, chest tightness and shortness of breath.   Cardiovascular: Negative for chest pain, palpitations and leg swelling.  Endocrine: Negative for polydipsia and polyuria.  Musculoskeletal:  Positive for back pain.  Neurological: Negative for dizziness, syncope, weakness, light-headedness and headaches.       Objective:   Physical Exam  Constitutional: He is oriented to person, place, and time. He appears well-developed and well-nourished.  HENT:  Right Ear: External ear normal.  Left Ear: External ear normal.  Mouth/Throat: Oropharynx is clear and moist.  Eyes: Pupils are equal, round, and reactive to light.  Neck: Neck supple. No thyromegaly present.  Cardiovascular: Normal rate and regular rhythm.   Pulmonary/Chest: Effort normal and breath sounds normal. No respiratory distress. He has no wheezes. He has no rales.  Musculoskeletal: He exhibits no edema.  Neurological: He is alert and oriented to person, place, and time.       Assessment:     #1 hypertension stable and at goal  #2 history of CAD  #3 dyslipidemia  #4 chronic low back pain    Plan:     -Obtain labs with lipid panel, basic metabolic panel, hepatic panel -The natural history of prostate cancer and ongoing controversy regarding screening and potential treatment outcomes of prostate cancer has been discussed with the patient. The meaning of a false positive PSA and a false negative PSA has been discussed. He indicates understanding of the limitations of this screening test and wishes  to proceed with screening PSA testing. We explained this is at odds with [place] preventive task force recommendations for screening but he nevertheless wishes to proceed with screening after discussion of pros and cons -Route for limited hydrocodone 10 mg 1 daily at bedtime when necessary for severe back pain #  20 with no refill -Routine follow-up in 6 months and sooner as needed.  [name] MD [facility] at [facility]

## 2017-06-03 ENCOUNTER — Other Ambulatory Visit: Payer: Self-pay

## 2017-06-03 MED ORDER — ISOSORBIDE MONONITRATE ER 30 MG PO TB24: 30.0000 mg | ORAL_TABLET | Freq: Every day | ORAL | 11 refills | Status: DC

## 2017-06-16 ENCOUNTER — Other Ambulatory Visit: Payer: Self-pay | Admitting: Family Medicine

## 2017-06-18 ENCOUNTER — Telehealth: Payer: Self-pay | Admitting: Family Medicine

## 2017-06-18 NOTE — Telephone Encounter (Signed)
ok 

## 2017-06-18 NOTE — Telephone Encounter (Signed)
Pt request refill  °HYDROcodone-acetaminophen (NORCO) 10-325 MG tablet °

## 2017-06-18 NOTE — Telephone Encounter (Signed)
Last refill and office visit  05/17/17.  Okay to fill?

## 2017-06-21 MED ORDER — HYDROCODONE-ACETAMINOPHEN 10-325 MG PO TABS: 1.0000 | ORAL_TABLET | Freq: Four times a day (QID) | ORAL | 0 refills | Status: DC | PRN

## 2017-06-21 NOTE — Telephone Encounter (Signed)
Rx ready for pick up and patient is aware 

## 2017-07-28 ENCOUNTER — Other Ambulatory Visit: Payer: Self-pay | Admitting: *Deleted

## 2017-07-28 ENCOUNTER — Other Ambulatory Visit: Payer: Self-pay | Admitting: Family Medicine

## 2017-07-28 ENCOUNTER — Telehealth: Payer: Self-pay | Admitting: Family Medicine

## 2017-07-28 MED ORDER — ROSUVASTATIN CALCIUM 20 MG PO TABS: ORAL_TABLET | ORAL | 1 refills | Status: DC

## 2017-07-28 NOTE — Telephone Encounter (Signed)
Last refill 06/21/17 and last office visit 05/17/17.  Okay to fill?

## 2017-07-28 NOTE — Telephone Encounter (Signed)
° ° °  Pt request refill of the following: ° ° °HYDROcodone-acetaminophen (NORCO) 10-325 MG tablet ° ° °Phamacy: °

## 2017-07-30 NOTE — Telephone Encounter (Signed)
With new regulations, will not be able to fill monthly without setting up every 3 month follow up for opioid review.  Offer follow up to discuss new regulations.

## 2017-07-30 NOTE — Telephone Encounter (Signed)
Patient is aware and an appointment made 

## 2017-08-11 ENCOUNTER — Ambulatory Visit: Payer: Medicare Other | Admitting: Family Medicine

## 2017-08-11 ENCOUNTER — Encounter: Payer: Self-pay | Admitting: Family Medicine

## 2017-08-11 ENCOUNTER — Encounter: Payer: Self-pay | Admitting: *Deleted

## 2017-08-11 VITALS — BP 132/80 | HR 58 | Temp 97.5°F | Ht 76.0 in | Wt 266.1 lb

## 2017-08-11 DIAGNOSIS — M545 Low back pain: Secondary | ICD-10-CM | POA: Diagnosis not present

## 2017-08-11 DIAGNOSIS — Z79899 Other long term (current) drug therapy: Secondary | ICD-10-CM | POA: Diagnosis not present

## 2017-08-11 DIAGNOSIS — G8929 Other chronic pain: Secondary | ICD-10-CM | POA: Diagnosis not present

## 2017-08-11 MED ORDER — HYDROCODONE-ACETAMINOPHEN 10-325 MG PO TABS: 1.0000 | ORAL_TABLET | Freq: Four times a day (QID) | ORAL | 0 refills | Status: DC | PRN

## 2017-08-11 NOTE — Progress Notes (Signed)
Subjective:     Patient ID: Rodney Barnett, male   DOB: [birth year], 76 y.o.   MRN: [id number]  HPI Patient here to discuss pain management follow-up. For years he is been taking once daily hydrocodone for severe low back pain. 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 He states it was recommended he have surgery once but he has declined because of his heart history and age. He states that taking just one 10 mg hydrocodone usually gets him through the bulk of his day. He's had no adverse side effects. No history of misuse. No history of illicit drug use. No constipation issues.  Past Medical History:  Diagnosis Date  . Chronic back pain   . Chronic rhinitis   . Coronary atherosclerosis of native coronary artery    Multivessel, LVEF 55-60%, basal inferior hypokinesis, known graft disease  . Erectile dysfunction   . Essential hypertension, benign   . Insomnia   . Lipoma   . Mixed hyperlipidemia   . Peripheral neuropathy    Past Surgical History:  Procedure Laterality Date  . CORONARY ARTERY BYPASS GRAFT  1994   LIMA to LAD, SVG to RCA, SVG to diagonal    reports that he quit smoking about 24 years ago. His smoking use included cigarettes. He started smoking about 62 years ago. He has a 120.00 pack-year smoking history. he has never used smokeless tobacco. He reports that he does not drink alcohol or use drugs. family history includes Cancer in his brother; Hypertension in his other. Allergies  Allergen Reactions  . Lipitor [Atorvastatin] Other (See Comments)    myalgia   Indication for chronic opioid: Chronic low back pain Medication and dose: Hydrocodone 10 mg once daily  # pills per month: 30 Last UDS date: Today Pain contract signed (Y/N): Yes Date narcotic database last reviewed (include red flags): Assessed 08/11/17 with no red flags   Review of Systems  Constitutional: Negative for fatigue.  Eyes: Negative for visual disturbance.  Respiratory: Negative for cough,  chest tightness and shortness of breath.   Cardiovascular: Negative for chest pain, palpitations and leg swelling.  Musculoskeletal: Positive for back pain.  Neurological: Negative for dizziness, syncope, weakness, light-headedness and headaches.       Objective:   Physical Exam  Constitutional: He is oriented to person, place, and time. He appears well-developed and well-nourished.  HENT:  Right Ear: External ear normal.  Left Ear: External ear normal.  Mouth/Throat: Oropharynx is clear and moist.  Eyes: Pupils are equal, round, and reactive to light.  Neck: Neck supple. No thyromegaly present.  Cardiovascular: Normal rate and regular rhythm.  Pulmonary/Chest: Effort normal and breath sounds normal. No respiratory distress. He has no wheezes. He has no rales.  Musculoskeletal: He exhibits no edema.  Neurological: He is alert and oriented to person, place, and time.       Assessment:     Chronic low back pain. Patient is a poor surgical candidate. Pain fairly well controlled with hydrocodone once daily    Plan:     -Refilled hydrocodone 10 mg #90 with no refill and this will last 3 months -Drug contract reviewed with patient and signed -Obtain urine drug screen -Patient agrees to every 3 month follow-up and assess controlled substance reporting system every 3 months at follow-up  [name] MD [facility] at [facility]

## 2017-08-12 LAB — PAIN MGMT, PROFILE 8 W/CONF, U
6 ACETYLMORPHINE: NEGATIVE ng/mL (ref <10)
ALCOHOL METABOLITES: NEGATIVE ng/mL (ref <500)
Amphetamines: NEGATIVE ng/mL (ref <500)
Benzodiazepines: NEGATIVE ng/mL (ref <100)
Buprenorphine, Urine: NEGATIVE ng/mL (ref <5)
COCAINE METABOLITE: NEGATIVE ng/mL (ref <150)
Creatinine: 133.2 mg/dL
MDMA: NEGATIVE ng/mL (ref <500)
Marijuana Metabolite: NEGATIVE ng/mL (ref <20)
OPIATES: NEGATIVE ng/mL (ref <100)
OXIDANT: NEGATIVE ug/mL (ref <200)
OXYCODONE: NEGATIVE ng/mL (ref <100)
pH: 6.64 (ref 4.5–9.0)

## 2017-08-18 ENCOUNTER — Ambulatory Visit: Payer: Self-pay | Admitting: Family Medicine

## 2017-08-30 ENCOUNTER — Other Ambulatory Visit: Payer: Self-pay | Admitting: Family Medicine

## 2017-09-29 ENCOUNTER — Other Ambulatory Visit: Payer: Self-pay | Admitting: Family Medicine

## 2017-11-16 ENCOUNTER — Ambulatory Visit: Payer: Medicare Other | Admitting: Family Medicine

## 2017-11-16 ENCOUNTER — Other Ambulatory Visit: Payer: Self-pay | Admitting: *Deleted

## 2017-11-16 ENCOUNTER — Encounter: Payer: Self-pay | Admitting: Family Medicine

## 2017-11-16 VITALS — BP 120/78 | HR 58 | Temp 97.9°F | Wt 272.3 lb

## 2017-11-16 DIAGNOSIS — G8929 Other chronic pain: Secondary | ICD-10-CM | POA: Diagnosis not present

## 2017-11-16 DIAGNOSIS — M545 Low back pain: Secondary | ICD-10-CM

## 2017-11-16 MED ORDER — HYDROCODONE-ACETAMINOPHEN 10-325 MG PO TABS: 1.0000 | ORAL_TABLET | Freq: Three times a day (TID) | ORAL | 0 refills | Status: DC | PRN

## 2017-11-16 MED ORDER — HYDROCODONE-ACETAMINOPHEN 10-325 MG PO TABS: 1.0000 | ORAL_TABLET | Freq: Four times a day (QID) | ORAL | 0 refills | Status: DC | PRN

## 2017-11-16 NOTE — Progress Notes (Signed)
Subjective:     Patient ID: Rodney Barnett, male   DOB: Feb 18, 1941, 77 y.o.   MRN: [id number]  HPI Patient seen for chronic pain management follow-up. He has chronic low back pain and some taking regimen of hydrocodone 10 mg and takes one half tablet twice daily and this has helped his pain management. He is also on gabapentin 300 mg 3 times a day for neuropathy pain. Has low back pain which has been manageable with medication. We've advised against escalating use if possible. He states this is controlling his pain fairly well and no side effects such as constipation  He did have drug screen back in November and this came back negative. However, he states he had run out of his medication for several days when this was done.  Indication for chronic opioid: Chronic low back pain Medication and dose: Hydrocodone 10 mg one half tablet twice daily  # pills per month: 30 Last UDS date: 11/18 Pain contract signed (Y/N): y Date narcotic database last reviewed (include red flags): Reviewed on today's visit. No red flags  Past Medical History:  Diagnosis Date  . Chronic back pain   . Chronic rhinitis   . Coronary atherosclerosis of native coronary artery    Multivessel, LVEF 55-60%, basal inferior hypokinesis, known graft disease  . Erectile dysfunction   . Essential hypertension, benign   . Insomnia   . Lipoma   . Mixed hyperlipidemia   . Peripheral neuropathy    Past Surgical History:  Procedure Laterality Date  . CORONARY ARTERY BYPASS GRAFT  1994   LIMA to LAD, SVG to RCA, SVG to diagonal    reports that he quit smoking about 24 years ago. His smoking use included cigarettes. He started smoking about 63 years ago. He has a 120.00 pack-year smoking history. he has never used smokeless tobacco. He reports that he does not drink alcohol or use drugs. family history includes Cancer in his brother; Hypertension in his other. Allergies  Allergen Reactions  . Lipitor [Atorvastatin] Other (See  Comments)    myalgia      Review of Systems  Constitutional: Negative for fatigue.  Eyes: Negative for visual disturbance.  Respiratory: Negative for cough, chest tightness and shortness of breath.   Cardiovascular: Negative for chest pain, palpitations and leg swelling.  Neurological: Negative for dizziness, syncope, weakness, light-headedness and headaches.       Objective:   Physical Exam  Constitutional: He is oriented to person, place, and time. He appears well-developed and well-nourished.  HENT:  Right Ear: External ear normal.  Left Ear: External ear normal.  Mouth/Throat: Oropharynx is clear and moist.  Eyes: Pupils are equal, round, and reactive to light.  Neck: Neck supple. No thyromegaly present.  Cardiovascular: Normal rate and regular rhythm.  Pulmonary/Chest: Effort normal and breath sounds normal. No respiratory distress. He has no wheezes. He has no rales.  Musculoskeletal: He exhibits no edema.  Neurological: He is alert and oriented to person, place, and time.       Assessment:     Chronic low back pain stable    Plan:     -Refill hydrocodone 10/325 mg one half tablet twice daily #90 with no refills -Routine follow-up in 3 months and sooner as needed  [name] MD [facility] at [facility]

## 2017-11-26 ENCOUNTER — Other Ambulatory Visit: Payer: Self-pay | Admitting: Family Medicine

## 2017-12-13 ENCOUNTER — Other Ambulatory Visit: Payer: Self-pay | Admitting: Cardiology

## 2017-12-27 ENCOUNTER — Other Ambulatory Visit: Payer: Self-pay | Admitting: Family Medicine

## 2018-01-12 ENCOUNTER — Other Ambulatory Visit: Payer: Self-pay | Admitting: Cardiology

## 2018-01-12 ENCOUNTER — Other Ambulatory Visit: Payer: Self-pay | Admitting: Family Medicine

## 2018-01-24 ENCOUNTER — Other Ambulatory Visit: Payer: Self-pay

## 2018-01-24 MED ORDER — LISINOPRIL 10 MG PO TABS: ORAL_TABLET | ORAL | 3 refills | Status: DC

## 2018-01-24 NOTE — Telephone Encounter (Signed)
Refilled lisinopril per fax request 

## 2018-01-27 ENCOUNTER — Other Ambulatory Visit: Payer: Self-pay | Admitting: Family Medicine

## 2018-02-01 NOTE — Progress Notes (Signed)
Cardiology Office Note  Date: 02/03/2018   ID: Rodney Barnett, DOB 01-May-1941, MRN [id number]  PCP: [name], MD  Primary Cardiologist: Rozann Lesches, MD   Chief Complaint  Patient presents with  . Coronary Artery Disease    History of Present Illness: Rodney Barnett is a 77 y.o. male last seen in May 2018.  He presents for a routine follow-up visit.  Reports no angina symptoms or nitroglycerin use.  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  I reviewed his medications.  Cardiac regimen includes aspirin, Plavix, M. Doerr, lisinopril, Lopressor, Crestor, and as needed nitroglycerin.  He follows lab work with his PCP.  Reports no medication intolerances.  I personally reviewed his ECG today which shows a sinus bradycardia.   Past Medical History:  Diagnosis Date  . Chronic back pain   . Chronic rhinitis   . Coronary atherosclerosis of native coronary artery    Multivessel, LVEF 55-60%, basal inferior hypokinesis, known graft disease  . Erectile dysfunction   . Essential hypertension, benign   . Insomnia   . Lipoma   . Mixed hyperlipidemia   . Peripheral neuropathy     Past Surgical History:  Procedure Laterality Date  . CORONARY ARTERY BYPASS GRAFT  1994   LIMA to LAD, SVG to RCA, SVG to diagonal    Current Outpatient Medications  Medication Sig Dispense Refill  . aspirin 81 MG tablet Take 81 mg by mouth daily.      . clopidogrel (PLAVIX) 75 MG tablet TAKE (1) TABLET BY MOUTH ONCE DAILY. 30 tablet 0  . gabapentin (NEURONTIN) 300 MG capsule TAKE (2) CAPSULES BY MOUTH THREE TIMES DAILY. 180 capsule 0  . HYDROcodone-acetaminophen (NORCO) 10-325 MG tablet Take 1 tablet by mouth every 6 (six) hours as needed. 90 tablet 0  . isosorbide mononitrate (IMDUR) 30 MG 24 hr tablet Take 1 tablet (30 mg total) by mouth daily. 30 tablet 11  . lisinopril (PRINIVIL,ZESTRIL) 10 MG tablet TAKE (1) TABLET BY MOUTH ONCE DAILY. 90 tablet 3  . metoprolol  tartrate (LOPRESSOR) 25 MG tablet TAKE (1) TABLET TWICE DAILY. 180 tablet 0  . nitroGLYCERIN (NITROSTAT) 0.4 MG SL tablet Place 1 tablet (0.4 mg total) under the tongue every 5 (five) minutes as needed. 25 tablet 3  . rosuvastatin (CRESTOR) 20 MG tablet TAKE 1 TABLET ONCE DAILY. 90 tablet 0   No current facility-administered medications for this visit.    Allergies:  Lipitor [atorvastatin]   Social History: The patient  reports that he quit smoking about 25 years ago. His smoking use included cigarettes. He started smoking about 63 years ago. He has a 120.00 pack-year smoking history. He has never used smokeless tobacco. He reports that he does not drink alcohol or use drugs.   ROS:  Please see the history of present illness. Otherwise, complete review of systems is positive for occasional right ankle swelling status post vein harvest.  All other systems are reviewed and negative.   Physical Exam: VS:  BP 122/68 (BP Location: Right Arm)   Pulse 68   Ht 6\' 4"  (1.93 m)   Wt 268 lb (121.6 kg)   SpO2 94%   BMI 32.62 kg/m , BMI Body mass index is 32.62 kg/m.  Wt Readings from Last 3 Encounters:  02/03/18 268 lb (121.6 kg)  11/16/17 272 lb 4.8 oz (123.5 kg)  08/11/17 266 lb 1.6 oz (120.7 kg)    General: Patient  appears comfortable at rest. HEENT: Conjunctiva and lids normal, oropharynx clear. Neck: Supple, no elevated JVP or carotid bruits, no thyromegaly. Lungs: Clear to auscultation, nonlabored breathing at rest. Cardiac: Regular rate and rhythm, S4, no S3 or significant systolic murmur. Abdomen: Soft, nontender, bowel sounds present, no guarding or rebound. Extremities: No significant edema, distal pulses 1-2+. Skin: Warm and dry. Musculoskeletal: No kyphosis. Neuropsychiatric: Alert and oriented x3, affect grossly appropriate.  ECG: I personally reviewed the tracing from 01/31/2016 shows sinus rhythm with nonspecific T-wave changes.  Recent Labwork: 05/17/2017: ALT 20; AST 19;  BUN 12; Creatinine, Ser 0.99; Potassium 4.3; Sodium 139     Component Value Date/Time   CHOL 119 05/17/2017 0840   TRIG 123.0 05/17/2017 0840   HDL 33.30 (L) 05/17/2017 0840   CHOLHDL 4 05/17/2017 0840   VLDL 24.6 05/17/2017 0840   LDLCALC 61 05/17/2017 0840   LDLDIRECT 114.8 06/07/2012 0937   Assessment and Plan:  1.  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  He remains stable without angina on medical therapy and continues to prefer observation.  We have not pushed for follow-up ischemic testing in light of symptom stability.  ECG reviewed and normal.  2.  Essential hypertension, blood pressure is well controlled today on current regimen.  Keep follow-up with PCP.  3.  Mixed hyperlipidemia, and is on Crestor.  LDL 61.  4.  Tobacco abuse in remission.  Current medicines were reviewed with the patient today.   Orders Placed This Encounter  Procedures  . EKG 12-Lead    Disposition: Follow-up in 1 year.  Signed, Satira Sark, MD, [facility] 02/03/2018 1:38 PM    [place] [facility] [address], [place], [place] [address] Phone: [phone]; Fax: [phone]

## 2018-02-03 ENCOUNTER — Ambulatory Visit: Payer: Medicare Other | Admitting: Cardiology

## 2018-02-03 ENCOUNTER — Encounter: Payer: Self-pay | Admitting: Cardiology

## 2018-02-03 VITALS — BP 122/68 | HR 68 | Ht 76.0 in | Wt 268.0 lb

## 2018-02-03 DIAGNOSIS — E782 Mixed hyperlipidemia: Secondary | ICD-10-CM

## 2018-02-03 DIAGNOSIS — I25119 Atherosclerotic heart disease of native coronary artery with unspecified angina pectoris: Secondary | ICD-10-CM | POA: Diagnosis not present

## 2018-02-03 DIAGNOSIS — I10 Essential (primary) hypertension: Secondary | ICD-10-CM | POA: Diagnosis not present

## 2018-02-03 DIAGNOSIS — F17201 Nicotine dependence, unspecified, in remission: Secondary | ICD-10-CM | POA: Diagnosis not present

## 2018-02-03 NOTE — Patient Instructions (Addendum)
Your physician wants you to follow-up in: 1 year with Dr.McDowell You will receive a reminder letter in the mail two months in advance. If you don't receive a letter, please call our office to schedule the follow-up appointment.     Your physician recommends that you continue on your current medications as directed. Please refer to the Current Medication list given to you today.    If you need a refill on your cardiac medications before your next appointment, please call your pharmacy.     No labs or tests ordered today.      Thank you for choosing [facility] !

## 2018-02-15 ENCOUNTER — Ambulatory Visit: Payer: Medicare Other | Admitting: Family Medicine

## 2018-02-15 ENCOUNTER — Encounter: Payer: Self-pay | Admitting: Family Medicine

## 2018-02-15 VITALS — BP 110/68 | HR 52 | Temp 97.7°F | Wt 271.3 lb

## 2018-02-15 DIAGNOSIS — M545 Low back pain, unspecified: Secondary | ICD-10-CM

## 2018-02-15 DIAGNOSIS — G8929 Other chronic pain: Secondary | ICD-10-CM | POA: Diagnosis not present

## 2018-02-15 MED ORDER — HYDROCODONE-ACETAMINOPHEN 10-325 MG PO TABS: 1.0000 | ORAL_TABLET | Freq: Four times a day (QID) | ORAL | 0 refills | Status: DC | PRN

## 2018-02-15 NOTE — Progress Notes (Signed)
  Subjective:     Patient ID: Rodney Barnett, male   DOB: 1941/10/01, 77 y.o.   MRN: [id number]  HPI Patient here for follow up regarding chronic low back pain. For several years now is taken low-dose oxycodone 10 mg one half tablet twice daily. This low dosage seems to control his pain fairly well. Has not had progressive pain. No new neurologic symptoms. No constipation or other side effects from medication.  History of CAD with bypass way back in 1994 and has done well since then. Recent follow-up with cardiology. No recent chest pains.  Indication for chronic opioid: Chronic low back pain Medication and dose: Oxycodone 10 mg one half tablet twice daily  # pills per month: 30 Last UDS date: November 2018. Screen was negative at that time but he was not taking medication as he had run out of that point Opioid Treatment Agreement signed (Y/N): Today renewed Opioid Treatment Agreement last reviewed with patient:  02/15/18 [facility] reviewed this encounter (include red flags):  review today with no red flags  Past Medical History:  Diagnosis Date  . Chronic back pain   . Chronic rhinitis   . Coronary atherosclerosis of native coronary artery    Multivessel, LVEF 55-60%, basal inferior hypokinesis, known graft disease  . Erectile dysfunction   . Essential hypertension, benign   . Insomnia   . Lipoma   . Mixed hyperlipidemia   . Peripheral neuropathy    Past Surgical History:  Procedure Laterality Date  . CORONARY ARTERY BYPASS GRAFT  1994   LIMA to LAD, SVG to RCA, SVG to diagonal    reports that he quit smoking about 25 years ago. His smoking use included cigarettes. He started smoking about 63 years ago. He has a 120.00 pack-year smoking history. He has never used smokeless tobacco. He reports that he does not drink alcohol or use drugs. family history includes Cancer in his brother; Hypertension in his other. Allergies  Allergen Reactions  . Lipitor [Atorvastatin] Other (See Comments)     myalgia      Review of Systems  Constitutional: Negative for appetite change, chills, fever and unexpected weight change.  Gastrointestinal: Negative for abdominal pain.  Musculoskeletal: Positive for back pain.  Neurological: Negative for weakness and numbness.       Objective:   Physical Exam  Constitutional: He appears well-developed and well-nourished.  Cardiovascular: Normal rate and regular rhythm.  Pulmonary/Chest: Effort normal and breath sounds normal.  Musculoskeletal: He exhibits no edema.  Straight leg raise are negative bilaterally  Neurological:  No focal strength deficits       Assessment:     Chronic low back pain stable on very low-dose oxycodone with dosage of 10 mg one half tablet twice daily. No history of issues. Drug registry reviewed and no red flags.    Plan:     -Routine follow-up in 3 months. Repeat urine drug screen at that point  [name] MD [facility] [place]

## 2018-03-01 ENCOUNTER — Other Ambulatory Visit: Payer: Self-pay | Admitting: Family Medicine

## 2018-03-29 ENCOUNTER — Other Ambulatory Visit: Payer: Self-pay | Admitting: Family Medicine

## 2018-04-12 ENCOUNTER — Other Ambulatory Visit: Payer: Self-pay | Admitting: Family Medicine

## 2018-04-27 ENCOUNTER — Other Ambulatory Visit: Payer: Self-pay | Admitting: Family Medicine

## 2018-05-18 ENCOUNTER — Ambulatory Visit: Payer: Medicare Other | Admitting: Family Medicine

## 2018-05-18 ENCOUNTER — Encounter: Payer: Self-pay | Admitting: Family Medicine

## 2018-05-18 VITALS — BP 128/64 | HR 50 | Temp 97.6°F | Wt 269.3 lb

## 2018-05-18 DIAGNOSIS — Z23 Encounter for immunization: Secondary | ICD-10-CM

## 2018-05-18 DIAGNOSIS — G8929 Other chronic pain: Secondary | ICD-10-CM | POA: Diagnosis not present

## 2018-05-18 DIAGNOSIS — I10 Essential (primary) hypertension: Secondary | ICD-10-CM

## 2018-05-18 DIAGNOSIS — M545 Low back pain: Secondary | ICD-10-CM

## 2018-05-18 DIAGNOSIS — Z0283 Encounter for blood-alcohol and blood-drug test: Secondary | ICD-10-CM

## 2018-05-18 DIAGNOSIS — E785 Hyperlipidemia, unspecified: Secondary | ICD-10-CM | POA: Diagnosis not present

## 2018-05-18 DIAGNOSIS — I2510 Atherosclerotic heart disease of native coronary artery without angina pectoris: Secondary | ICD-10-CM | POA: Diagnosis not present

## 2018-05-18 DIAGNOSIS — Z5181 Encounter for therapeutic drug level monitoring: Secondary | ICD-10-CM

## 2018-05-18 LAB — BASIC METABOLIC PANEL
BUN: 12 mg/dL (ref 6–23)
CALCIUM: 9.5 mg/dL (ref 8.4–10.5)
CO2: 32 meq/L (ref 19–32)
Chloride: 101 mEq/L (ref 96–112)
Creatinine, Ser: 1.2 mg/dL (ref 0.40–1.50)
GFR: 62.56 mL/min (ref >60.00)
Glucose, Bld: 111 mg/dL — ABNORMAL HIGH (ref 70–99)
Potassium: 4.7 mEq/L (ref 3.5–5.1)
SODIUM: 138 meq/L (ref 135–145)

## 2018-05-18 LAB — HEPATIC FUNCTION PANEL
ALBUMIN: 4.2 g/dL (ref 3.5–5.2)
ALK PHOS: 55 U/L (ref 39–117)
ALT: 14 U/L (ref 0–53)
AST: 17 U/L (ref 0–37)
Bilirubin, Direct: 0.2 mg/dL (ref 0.0–0.3)
TOTAL PROTEIN: 7.1 g/dL (ref 6.0–8.3)
Total Bilirubin: 1.1 mg/dL (ref 0.2–1.2)

## 2018-05-18 LAB — LIPID PANEL
CHOLESTEROL: 115 mg/dL (ref 0–200)
HDL: 32.7 mg/dL — AB (ref >39.00)
LDL Cholesterol: 44 mg/dL (ref 0–99)
NonHDL: 82.67
Total CHOL/HDL Ratio: 4
Triglycerides: 193 mg/dL — ABNORMAL HIGH (ref 0.0–149.0)
VLDL: 38.6 mg/dL (ref 0.0–40.0)

## 2018-05-18 MED ORDER — HYDROCODONE-ACETAMINOPHEN 10-325 MG PO TABS: 1.0000 | ORAL_TABLET | Freq: Four times a day (QID) | ORAL | 0 refills | Status: DC | PRN

## 2018-05-18 NOTE — Patient Instructions (Signed)
Set up 3 month follow up  (15 minute).

## 2018-05-18 NOTE — Progress Notes (Signed)
  Subjective:     Patient ID: Rodney Barnett, male   DOB: [birth year], 77 y.o.   MRN: [id number]  HPI Patient here for the following issues  Chronic back pain. Pain managed with just one hydrocodone daily which helps him with functioning throughout the day. He did have negative drug screen profile back in November but he was out of his medication at time  that was done. He is now taking one daily. Pain is fairly well controlled. No constipation issues.  History of CAD. No recent chest pain. He remains on Crestor for hyperlipidemia. He is on lisinopril and metoprolol for hypertension. Denies any recent dizziness. Appetite and weight stable.  No history of documented Prevnar. He thinks he's had previous Pneumovax. He declines tetanus booster.  Past Medical History:  Diagnosis Date  . Chronic back pain   . Chronic rhinitis   . Coronary atherosclerosis of native coronary artery    Multivessel, LVEF 55-60%, basal inferior hypokinesis, known graft disease  . Erectile dysfunction   . Essential hypertension, benign   . Insomnia   . Lipoma   . Mixed hyperlipidemia   . Peripheral neuropathy    Past Surgical History:  Procedure Laterality Date  . CORONARY ARTERY BYPASS GRAFT  1994   LIMA to LAD, SVG to RCA, SVG to diagonal    reports that he quit smoking about 25 years ago. His smoking use included cigarettes. He started smoking about 63 years ago. He has a 120.00 pack-year smoking history. He has never used smokeless tobacco. He reports that he does not drink alcohol or use drugs. family history includes Cancer in his brother; Hypertension in his other. Allergies  Allergen Reactions  . Lipitor [Atorvastatin] Other (See Comments)    myalgia     Review of Systems  Constitutional: Negative for fatigue and unexpected weight change.  Eyes: Negative for visual disturbance.  Respiratory: Negative for cough, chest tightness and shortness of breath.   Cardiovascular: Negative for chest pain,  palpitations and leg swelling.  Endocrine: Negative for polydipsia and polyuria.  Musculoskeletal: Positive for back pain.  Neurological: Negative for dizziness, syncope, weakness, light-headedness and headaches.       Objective:   Physical Exam  Constitutional: He is oriented to person, place, and time. He appears well-developed and well-nourished.  HENT:  Right Ear: External ear normal.  Left Ear: External ear normal.  Mouth/Throat: Oropharynx is clear and moist.  Eyes: Pupils are equal, round, and reactive to light.  Neck: Neck supple. No thyromegaly present.  Cardiovascular: Normal rate and regular rhythm.  Pulmonary/Chest: Effort normal and breath sounds normal. No respiratory distress. He has no wheezes. He has no rales.  Musculoskeletal: He exhibits no edema.  Neurological: He is alert and oriented to person, place, and time.       Assessment:     #1 chronic back pain stable on once daily hydrocodone. Drug registry reviewed with no red flags.  #2 history of CAD and dyslipidemia on Crestor  #3 hypertension stable and at goal    Plan:     -Check labs with lipid panel, hepatic panel, basic metabolic panel -Refill hydrocodone 10 mg #90 (90 day supply) -Repeat urine drug screen today -Reminder for flu vaccine this fall -Prevnar 13 given -Routine follow-up 3 months and sooner as needed  [name] MD [facility]

## 2018-05-21 LAB — PAIN MGMT, PROFILE 8 W/CONF, U
6 Acetylmorphine: NEGATIVE ng/mL (ref <10)
Alcohol Metabolites: NEGATIVE ng/mL (ref <500)
Amphetamines: NEGATIVE ng/mL (ref <500)
BENZODIAZEPINES: NEGATIVE ng/mL (ref <100)
BUPRENORPHINE, URINE: NEGATIVE ng/mL (ref <5)
Cocaine Metabolite: NEGATIVE ng/mL (ref <150)
Codeine: NEGATIVE ng/mL (ref <50)
Creatinine: 109.3 mg/dL
HYDROCODONE: 1308 ng/mL — AB (ref <50)
HYDROMORPHONE: NEGATIVE ng/mL (ref <50)
MDMA: NEGATIVE ng/mL (ref <500)
MORPHINE: NEGATIVE ng/mL (ref <50)
Marijuana Metabolite: NEGATIVE ng/mL (ref <20)
Norhydrocodone: 1537 ng/mL — ABNORMAL HIGH (ref <50)
Opiates: POSITIVE ng/mL — AB (ref <100)
Oxidant: NEGATIVE ug/mL (ref <200)
Oxycodone: NEGATIVE ng/mL (ref <100)
pH: 5.68 (ref 4.5–9.0)

## 2018-05-30 ENCOUNTER — Other Ambulatory Visit: Payer: Self-pay | Admitting: Family Medicine

## 2018-06-29 ENCOUNTER — Other Ambulatory Visit: Payer: Self-pay | Admitting: Family Medicine

## 2018-06-29 ENCOUNTER — Other Ambulatory Visit: Payer: Self-pay | Admitting: Cardiology

## 2018-07-04 ENCOUNTER — Ambulatory Visit: Payer: Medicare Other | Admitting: Family Medicine

## 2018-07-04 ENCOUNTER — Other Ambulatory Visit: Payer: Self-pay

## 2018-07-04 ENCOUNTER — Encounter: Payer: Self-pay | Admitting: Family Medicine

## 2018-07-04 VITALS — BP 148/84 | HR 65 | Temp 98.2°F | Ht 76.0 in | Wt 265.0 lb

## 2018-07-04 DIAGNOSIS — R319 Hematuria, unspecified: Secondary | ICD-10-CM

## 2018-07-04 DIAGNOSIS — R300 Dysuria: Secondary | ICD-10-CM | POA: Diagnosis not present

## 2018-07-04 LAB — POCT URINALYSIS DIPSTICK
BILIRUBIN UA: NEGATIVE
Glucose, UA: NEGATIVE
KETONES UA: NEGATIVE
Nitrite, UA: NEGATIVE
PROTEIN UA: POSITIVE — AB
Spec Grav, UA: 1.01 (ref 1.010–1.025)
Urobilinogen, UA: 0.2 E.U./dL
pH, UA: 7 (ref 5.0–8.0)

## 2018-07-04 MED ORDER — CIPROFLOXACIN HCL 500 MG PO TABS: 500.0000 mg | ORAL_TABLET | Freq: Two times a day (BID) | ORAL | 0 refills | Status: DC

## 2018-07-04 NOTE — Progress Notes (Signed)
  Subjective:     Patient ID: Rodney Barnett, male   DOB: 1940-10-25, 77 y.o.   MRN: [id number]  HPI Patient is seen with 4 or 5-day history of some burning with urination and intermittent gross blood.  He states he had one episode of gross blood about a year ago but never told anyone.  He denies any fever or chills.  No history of kidney stones.  No flank pain.  No suprapubic pain.  He has cardiac history and takes aspirin and Plavix regularly.  Bleeding symptoms have been somewhat intermittent past few days. No other bleeding issues.  Patient quit smoking about 20 years ago.  Past Medical History:  Diagnosis Date  . Chronic back pain   . Chronic rhinitis   . Coronary atherosclerosis of native coronary artery    Multivessel, LVEF 55-60%, basal inferior hypokinesis, known graft disease  . Erectile dysfunction   . Essential hypertension, benign   . Insomnia   . Lipoma   . Mixed hyperlipidemia   . Peripheral neuropathy    Past Surgical History:  Procedure Laterality Date  . CORONARY ARTERY BYPASS GRAFT  1994   LIMA to LAD, SVG to RCA, SVG to diagonal    reports that he quit smoking about 25 years ago. His smoking use included cigarettes. He started smoking about 63 years ago. He has a 120.00 pack-year smoking history. He has never used smokeless tobacco. He reports that he does not drink alcohol or use drugs. family history includes Cancer in his brother; Hypertension in his other. Allergies  Allergen Reactions  . Lipitor [Atorvastatin] Other (See Comments)    myalgia     Review of Systems  Constitutional: Negative for chills and fever.  Respiratory: Negative for shortness of breath.   Cardiovascular: Negative for chest pain.  Genitourinary: Positive for dysuria and hematuria. Negative for difficulty urinating.       Objective:   Physical Exam  Constitutional: He appears well-developed and well-nourished.  Cardiovascular: Normal rate and regular rhythm.  Pulmonary/Chest:  Effort normal and breath sounds normal.  Musculoskeletal:  No flank tenderness       Assessment:     Presents with approximately 4-day history of some intermittent burning with urination and gross blood with urination.  Urine dipstick reveals large blood and trace leukocytes.  Negative nitrites.  Differential would include infection (less likely), stone, malignancy    Plan:     -Urine sent for culture -Cover with Cipro 500 mg twice daily for 5 days pending culture results -Stay well-hydrated -We will plan on setting up urology referral especially if urine culture negative -We will likely need further work-up to rule out possible malignancy  [name] MD [facility]

## 2018-07-04 NOTE — Patient Instructions (Signed)
Drink plenty of water  We will call you with urine culture results  If urine culture negative will set up Urology referral.

## 2018-07-05 LAB — URINE CULTURE
MICRO NUMBER:: 91171366
Result:: NO GROWTH
SPECIMEN QUALITY: ADEQUATE

## 2018-07-05 LAB — URINALYSIS, MICROSCOPIC ONLY

## 2018-07-06 ENCOUNTER — Other Ambulatory Visit: Payer: Self-pay | Admitting: Family Medicine

## 2018-07-06 DIAGNOSIS — R310 Gross hematuria: Secondary | ICD-10-CM

## 2018-07-07 ENCOUNTER — Other Ambulatory Visit: Payer: Self-pay | Admitting: Family Medicine

## 2018-08-01 ENCOUNTER — Other Ambulatory Visit: Payer: Self-pay | Admitting: Family Medicine

## 2018-08-19 ENCOUNTER — Encounter: Payer: Self-pay | Admitting: Family Medicine

## 2018-08-19 ENCOUNTER — Ambulatory Visit: Payer: Medicare Other | Admitting: Family Medicine

## 2018-08-19 ENCOUNTER — Other Ambulatory Visit: Payer: Self-pay

## 2018-08-19 VITALS — BP 138/76 | HR 57 | Temp 97.9°F | Ht 76.0 in | Wt 261.3 lb

## 2018-08-19 DIAGNOSIS — G8929 Other chronic pain: Secondary | ICD-10-CM | POA: Diagnosis not present

## 2018-08-19 DIAGNOSIS — M545 Low back pain: Secondary | ICD-10-CM

## 2018-08-19 MED ORDER — HYDROCODONE-ACETAMINOPHEN 10-325 MG PO TABS: 1.0000 | ORAL_TABLET | Freq: Four times a day (QID) | ORAL | 0 refills | Status: DC | PRN

## 2018-08-19 NOTE — Progress Notes (Signed)
  Subjective:     Patient ID: Rodney Barnett, male   DOB: 08-27-1941, 77 y.o.   MRN: [id number]  HPI Patient is seen for medical follow-up for his chronic back pain.  Since last visit when he came in with gross hematuria and we referred him to urology and he had CT scan which showed 1.7 cm mass right posterior bladder wall suspicious for cancer.  He had biopsy yesterday.  They are in process of setting up likely surgery.  No further gross hematuria.  He has chronic back pain takes low-dose hydrocodone 10 mg 1/2 tablet twice daily.  Pain fairly well controlled with low dose hydrocodone  Indication for chronic opioid: chronic back pain Medication and dose: hydrocodone 10 mg one half tablet twice daily. # pills per month: 30 Last UDS date: 8/19 Opioid Treatment Agreement signed (Y/N): y Opioid Treatment Agreement last reviewed with patient: 2019   [place] reviewed this encounter (include red flags):reviewed today.  No red flags.    Past Medical History:  Diagnosis Date  . Chronic back pain   . Chronic rhinitis   . Coronary atherosclerosis of native coronary artery    Multivessel, LVEF 55-60%, basal inferior hypokinesis, known graft disease  . Erectile dysfunction   . Essential hypertension, benign   . Insomnia   . Lipoma   . Mixed hyperlipidemia   . Peripheral neuropathy    Past Surgical History:  Procedure Laterality Date  . CORONARY ARTERY BYPASS GRAFT  1994   LIMA to LAD, SVG to RCA, SVG to diagonal    reports that he quit smoking about 25 years ago. His smoking use included cigarettes. He started smoking about 63 years ago. He has a 120.00 pack-year smoking history. He has never used smokeless tobacco. He reports that he does not drink alcohol or use drugs. family history includes Cancer in his brother; Hypertension in his other. Allergies  Allergen Reactions  . Lipitor [Atorvastatin] Other (See Comments)    myalgia     Review of Systems  Constitutional: Negative for activity  change, appetite change and fever.  Respiratory: Negative for cough and shortness of breath.   Cardiovascular: Negative for chest pain and leg swelling.  Gastrointestinal: Negative for abdominal pain and vomiting.  Genitourinary: Negative for dysuria, flank pain and hematuria.  Musculoskeletal: Positive for back pain. Negative for joint swelling.  Neurological: Negative for weakness and numbness.       Objective:   Physical Exam  Constitutional: He appears well-developed and well-nourished.  Cardiovascular: Normal rate and regular rhythm.  Pulmonary/Chest: Effort normal and breath sounds normal.  Musculoskeletal: He exhibits no edema.       Assessment:     #1 chronic back pain controlled with low-dose hydrocodone  #2 recent gross hematuria.  Work-up has revealed concern for probable bladder cancer.  In process of work-up    Plan:     -Refill hydrocodone for 3 months -Flu vaccine recommended and patient declines -We will plan routine follow-up in 3 months and sooner as needed  [name] MD [facility] at [facility]

## 2018-08-23 ENCOUNTER — Telehealth: Payer: Self-pay

## 2018-08-23 NOTE — Telephone Encounter (Signed)
Completed paperwork has been faxed to [facility].

## 2018-08-23 NOTE — Telephone Encounter (Signed)
Form has been placed on your desk in red folder. Please return when complete so I can fax to [facility]. Thank you!  Copied from [place] [phone]. Topic: General - Other >> Aug 22, 2018 10:14 AM Yvette Rack wrote: Reason for CRM: Marlowe Kays with [facility] requests surgical clearance for pt. Cb# [id number] Ext. [phone] >> Aug 22, 2018 10:56 AM Cox, Melburn Hake, CMA wrote: Need to know if form has been faxed over.  LMTCB

## 2018-08-23 NOTE — Telephone Encounter (Signed)
done

## 2018-08-24 ENCOUNTER — Other Ambulatory Visit: Payer: Self-pay | Admitting: Urology

## 2018-08-26 NOTE — Patient Instructions (Addendum)
Rodney Barnett [facility]  08/26/2018   Your procedure is scheduled on: 09-09-18    Report to [facility] Main  Entrance    Report to Admitting at 5:30 AM    Call this number if you have problems the morning of surgery [phone]    Remember: Do not eat food or drink liquids :After Midnight.    BRUSH YOUR TEETH MORNING OF SURGERY AND RINSE YOUR MOUTH OUT, NO CHEWING GUM CANDY OR MINTS.     Take these medicines the morning of surgery with A SIP OF WATER: Gabapentin (Neurontin), Isosorbide Mononitrate (Imdur) and Metoprolol Tartrate (Lopressor)                                You may not have any metal on your body including hair pins and              piercings  Do not wear jewelry, lotions, powders, cologne or deodorant             Men may shave face and neck.   Do not bring valuables to the hospital. [place].  Contacts, dentures or bridgework may not be worn into surgery.     Patients discharged the day of surgery will not be allowed to drive home.  Name and phone number of your driver: Rodney Barnett [phone]               Please read over the following fact sheets you were given: _____________________________________________________________________             [facility] - Preparing for Surgery Before surgery, you can play an important role.  Because skin is not sterile, your skin needs to be as free of germs as possible.  You can reduce the number of germs on your skin by washing with CHG (chlorahexidine gluconate) soap before surgery.  CHG is an antiseptic cleaner which kills germs and bonds with the skin to continue killing germs even after washing. Please DO NOT use if you have an allergy to CHG or antibacterial soaps.  If your skin becomes reddened/irritated stop using the CHG and inform your nurse when you arrive at Short Stay. Do not shave (including legs and underarms) for at least 48 hours prior to the  first CHG shower.  You may shave your face/neck. Please follow these instructions carefully:  1.  Shower with CHG Soap the night before surgery and the  morning of Surgery.  2.  If you choose to wash your hair, wash your hair first as usual with your  normal  shampoo.  3.  After you shampoo, rinse your hair and body thoroughly to remove the  shampoo.                           4.  Use CHG as you would any other liquid soap.  You can apply chg directly  to the skin and wash                       Gently with a scrungie or clean washcloth.  5.  Apply the CHG Soap to your body ONLY FROM THE NECK DOWN.   Do  not use on face/ open                           Wound or open sores. Avoid contact with eyes, ears mouth and genitals (private parts).                       Wash face,  Genitals (private parts) with your normal soap.             6.  Wash thoroughly, paying special attention to the area where your surgery  will be performed.  7.  Thoroughly rinse your body with warm water from the neck down.  8.  DO NOT shower/wash with your normal soap after using and rinsing off  the CHG Soap.                9.  Pat yourself dry with a clean towel.            10.  Wear clean pajamas.            11.  Place clean sheets on your bed the night of your first shower and do not  sleep with pets. Day of Surgery : Do not apply any lotions/deodorants the morning of surgery.  Please wear clean clothes to the hospital/surgery center.  FAILURE TO FOLLOW THESE INSTRUCTIONS MAY RESULT IN THE CANCELLATION OF YOUR SURGERY PATIENT SIGNATURE_________________________________  NURSE SIGNATURE__________________________________  ________________________________________________________________________

## 2018-08-26 NOTE — Progress Notes (Addendum)
  08-22-18 Surgical Clearance on chart from Dr. Elease Hashimoto  02-03-18 (Epic) EKG

## 2018-08-29 ENCOUNTER — Other Ambulatory Visit: Payer: Self-pay

## 2018-08-29 ENCOUNTER — Telehealth: Payer: Self-pay | Admitting: *Deleted

## 2018-08-29 ENCOUNTER — Encounter (HOSPITAL_COMMUNITY)
Admission: RE | Admit: 2018-08-29 | Discharge: 2018-08-29 | Disposition: A | Discharge status: Home / Self Care | Payer: Medicare Other | Source: Ambulatory Visit | Attending: Urology | Admitting: Urology

## 2018-08-29 ENCOUNTER — Encounter (HOSPITAL_COMMUNITY): Payer: Self-pay

## 2018-08-29 DIAGNOSIS — Z01812 Encounter for preprocedural laboratory examination: Secondary | ICD-10-CM | POA: Insufficient documentation

## 2018-08-29 LAB — CBC
HCT: 46.5 % (ref 39.0–52.0)
Hemoglobin: 15.4 g/dL (ref 13.0–17.0)
MCH: 33.9 pg (ref 26.0–34.0)
MCHC: 33.1 g/dL (ref 30.0–36.0)
MCV: 102.4 fL — ABNORMAL HIGH (ref 80.0–100.0)
Platelets: 140 10*3/uL — ABNORMAL LOW (ref 150–400)
RBC: 4.54 MIL/uL (ref 4.22–5.81)
RDW: 13.3 % (ref 11.5–15.5)
WBC: 5.1 10*3/uL (ref 4.0–10.5)
nRBC: 0 % (ref 0.0–0.2)

## 2018-08-29 LAB — BASIC METABOLIC PANEL
Anion gap: 6 (ref 5–15)
BUN: 12 mg/dL (ref 8–23)
CHLORIDE: 104 mmol/L (ref 98–111)
CO2: 28 mmol/L (ref 22–32)
CREATININE: 1.11 mg/dL (ref 0.61–1.24)
Calcium: 9.2 mg/dL (ref 8.9–10.3)
GFR calc non Af Amer: >60 mL/min (ref >60)
GLUCOSE: 95 mg/dL (ref 70–99)
Potassium: 4.8 mmol/L (ref 3.5–5.1)
Sodium: 138 mmol/L (ref 135–145)

## 2018-08-29 NOTE — Telephone Encounter (Signed)
Already completed.  Because of his cardiac hx, they may be waiting to hear from them.

## 2018-08-29 NOTE — Telephone Encounter (Signed)
Copied from [place] [phone]. Topic: General - Other >> Aug 22, 2018 10:14 AM Yvette Rack wrote: Reason for CRM: Marlowe Kays with [facility] requests surgical clearance for pt. Cb# [phone] Ext. [phone] >> Aug 22, 2018 10:56 AM Cox, Melburn Hake, CMA wrote: Need to know if form has been faxed over.  LMTCB

## 2018-09-03 ENCOUNTER — Other Ambulatory Visit: Payer: Self-pay | Admitting: Family Medicine

## 2018-09-03 ENCOUNTER — Other Ambulatory Visit: Payer: Self-pay | Admitting: Cardiology

## 2018-09-09 ENCOUNTER — Ambulatory Visit (HOSPITAL_COMMUNITY): Payer: Medicare Other | Admitting: Anesthesiology

## 2018-09-09 ENCOUNTER — Encounter (HOSPITAL_COMMUNITY)
Admission: RE | Disposition: A | Discharge status: Home / Self Care | Payer: Self-pay | Source: Ambulatory Visit | Attending: Urology

## 2018-09-09 ENCOUNTER — Ambulatory Visit (HOSPITAL_COMMUNITY)
Admission: RE | Admit: 2018-09-09 | Discharge: 2018-09-09 | Disposition: A | Discharge status: Home / Self Care | Payer: Medicare Other | Source: Ambulatory Visit | Attending: Urology | Admitting: Urology

## 2018-09-09 ENCOUNTER — Encounter (HOSPITAL_COMMUNITY): Payer: Self-pay

## 2018-09-09 DIAGNOSIS — Z8051 Family history of malignant neoplasm of kidney: Secondary | ICD-10-CM | POA: Diagnosis not present

## 2018-09-09 DIAGNOSIS — N401 Enlarged prostate with lower urinary tract symptoms: Secondary | ICD-10-CM | POA: Diagnosis not present

## 2018-09-09 DIAGNOSIS — R310 Gross hematuria: Secondary | ICD-10-CM | POA: Insufficient documentation

## 2018-09-09 DIAGNOSIS — C674 Malignant neoplasm of posterior wall of bladder: Secondary | ICD-10-CM | POA: Insufficient documentation

## 2018-09-09 DIAGNOSIS — Z951 Presence of aortocoronary bypass graft: Secondary | ICD-10-CM | POA: Diagnosis not present

## 2018-09-09 DIAGNOSIS — D494 Neoplasm of unspecified behavior of bladder: Secondary | ICD-10-CM

## 2018-09-09 DIAGNOSIS — Z79899 Other long term (current) drug therapy: Secondary | ICD-10-CM | POA: Insufficient documentation

## 2018-09-09 DIAGNOSIS — Z7982 Long term (current) use of aspirin: Secondary | ICD-10-CM | POA: Insufficient documentation

## 2018-09-09 DIAGNOSIS — Z7902 Long term (current) use of antithrombotics/antiplatelets: Secondary | ICD-10-CM | POA: Insufficient documentation

## 2018-09-09 DIAGNOSIS — I2510 Atherosclerotic heart disease of native coronary artery without angina pectoris: Secondary | ICD-10-CM | POA: Insufficient documentation

## 2018-09-09 DIAGNOSIS — I10 Essential (primary) hypertension: Secondary | ICD-10-CM | POA: Insufficient documentation

## 2018-09-09 DIAGNOSIS — Z87891 Personal history of nicotine dependence: Secondary | ICD-10-CM | POA: Insufficient documentation

## 2018-09-09 HISTORY — PX: TRANSURETHRAL RESECTION OF BLADDER TUMOR: SHX2575

## 2018-09-09 SURGERY — TURBT (TRANSURETHRAL RESECTION OF BLADDER TUMOR)
Anesthesia: General | Site: Bladder

## 2018-09-09 MED ORDER — PROPOFOL 10 MG/ML IV BOLUS
INTRAVENOUS | Status: AC
  Filled 2018-09-09: qty 20

## 2018-09-09 MED ORDER — PHENAZOPYRIDINE HCL 200 MG PO TABS: 200.0000 mg | ORAL_TABLET | Freq: Three times a day (TID) | ORAL | 0 refills | Status: DC | PRN

## 2018-09-09 MED ORDER — CIPROFLOXACIN IN D5W 400 MG/200ML IV SOLN
400.0000 mg | Freq: Once | INTRAVENOUS | Status: AC
  Administered 2018-09-09: 400 mg via INTRAVENOUS
  Filled 2018-09-09: qty 200

## 2018-09-09 MED ORDER — ONDANSETRON HCL 4 MG/2ML IJ SOLN
INTRAMUSCULAR | Status: DC | PRN
  Administered 2018-09-09: 4 mg via INTRAVENOUS

## 2018-09-09 MED ORDER — PHENYLEPHRINE 40 MCG/ML (10ML) SYRINGE FOR IV PUSH (FOR BLOOD PRESSURE SUPPORT)
PREFILLED_SYRINGE | INTRAVENOUS | Status: AC
  Filled 2018-09-09: qty 10

## 2018-09-09 MED ORDER — PROPOFOL 10 MG/ML IV BOLUS
INTRAVENOUS | Status: DC | PRN
  Administered 2018-09-09: 20 mg via INTRAVENOUS
  Administered 2018-09-09: 110 mg via INTRAVENOUS
  Administered 2018-09-09 (×2): 20 mg via INTRAVENOUS

## 2018-09-09 MED ORDER — FENTANYL CITRATE (PF) 100 MCG/2ML IJ SOLN
INTRAMUSCULAR | Status: AC
  Filled 2018-09-09: qty 4

## 2018-09-09 MED ORDER — ONDANSETRON HCL 4 MG/2ML IJ SOLN
INTRAMUSCULAR | Status: AC
  Filled 2018-09-09: qty 2

## 2018-09-09 MED ORDER — FENTANYL CITRATE (PF) 100 MCG/2ML IJ SOLN
25.0000 ug | INTRAMUSCULAR | Status: AC | PRN
  Administered 2018-09-09: 25 ug via INTRAVENOUS
  Administered 2018-09-09: 50 ug via INTRAVENOUS
  Administered 2018-09-09 (×3): 25 ug via INTRAVENOUS
  Administered 2018-09-09: 50 ug via INTRAVENOUS

## 2018-09-09 MED ORDER — TRAMADOL HCL 50 MG PO TABS: 50.0000 mg | ORAL_TABLET | Freq: Four times a day (QID) | ORAL | 0 refills | Status: DC | PRN

## 2018-09-09 MED ORDER — SODIUM CHLORIDE 0.9 % IR SOLN
Status: DC | PRN
  Administered 2018-09-09: 6000 mL

## 2018-09-09 MED ORDER — GEMCITABINE CHEMO FOR BLADDER INSTILLATION 2000 MG
2000.0000 mg | Freq: Once | INTRAVENOUS | Status: DC
  Filled 2018-09-09: qty 52.6

## 2018-09-09 MED ORDER — LIDOCAINE 2% (20 MG/ML) 5 ML SYRINGE
INTRAMUSCULAR | Status: AC
  Filled 2018-09-09: qty 5

## 2018-09-09 MED ORDER — LACTATED RINGERS IV SOLN
INTRAVENOUS | Status: DC
  Administered 2018-09-09: 06:00:00 via INTRAVENOUS

## 2018-09-09 MED ORDER — FENTANYL CITRATE (PF) 100 MCG/2ML IJ SOLN
INTRAMUSCULAR | Status: AC
  Filled 2018-09-09: qty 2

## 2018-09-09 MED ORDER — FENTANYL CITRATE (PF) 100 MCG/2ML IJ SOLN
INTRAMUSCULAR | Status: DC | PRN
  Administered 2018-09-09: 25 ug via INTRAVENOUS

## 2018-09-09 MED ORDER — PHENYLEPHRINE 40 MCG/ML (10ML) SYRINGE FOR IV PUSH (FOR BLOOD PRESSURE SUPPORT)
PREFILLED_SYRINGE | INTRAVENOUS | Status: DC | PRN
  Administered 2018-09-09: 80 ug via INTRAVENOUS

## 2018-09-09 MED ORDER — EPHEDRINE SULFATE-NACL 50-0.9 MG/10ML-% IV SOSY
PREFILLED_SYRINGE | INTRAVENOUS | Status: DC | PRN
  Administered 2018-09-09: 10 mg via INTRAVENOUS
  Administered 2018-09-09: 5 mg via INTRAVENOUS
  Administered 2018-09-09 (×2): 10 mg via INTRAVENOUS

## 2018-09-09 MED ORDER — DEXAMETHASONE SODIUM PHOSPHATE 10 MG/ML IJ SOLN
INTRAMUSCULAR | Status: DC | PRN
  Administered 2018-09-09: 10 mg via INTRAVENOUS

## 2018-09-09 MED ORDER — EPHEDRINE 5 MG/ML INJ
INTRAVENOUS | Status: AC
  Filled 2018-09-09: qty 10

## 2018-09-09 MED ORDER — LIDOCAINE 2% (20 MG/ML) 5 ML SYRINGE
INTRAMUSCULAR | Status: DC | PRN
  Administered 2018-09-09: 10 mg via INTRAVENOUS

## 2018-09-09 MED ORDER — DEXAMETHASONE SODIUM PHOSPHATE 10 MG/ML IJ SOLN
INTRAMUSCULAR | Status: AC
  Filled 2018-09-09: qty 1

## 2018-09-09 SURGICAL SUPPLY — 18 items
BAG URINE DRAINAGE (UROLOGICAL SUPPLIES) ×1 IMPLANT
BAG URO CATCHER STRL LF (MISCELLANEOUS) ×2 IMPLANT
CATH FOLEY 2WAY SLVR  5CC 16FR (CATHETERS) ×1
CATH FOLEY 2WAY SLVR 5CC 16FR (CATHETERS) IMPLANT
COVER WAND RF STERILE (DRAPES) IMPLANT
ELECT REM PT RETURN 15FT ADLT (MISCELLANEOUS) ×2 IMPLANT
EVACUATOR MICROVAS BLADDER (UROLOGICAL SUPPLIES) IMPLANT
GLOVE BIOGEL M 8.0 STRL (GLOVE) ×2 IMPLANT
GOWN STRL REUS W/TWL XL LVL3 (GOWN DISPOSABLE) ×2 IMPLANT
HOLDER FOLEY CATH W/STRAP (MISCELLANEOUS) ×1 IMPLANT
LOOP CUT BIPOLAR 24F LRG (ELECTROSURGICAL) ×1 IMPLANT
MANIFOLD NEPTUNE II (INSTRUMENTS) ×2 IMPLANT
NS IRRIG 1000ML POUR BTL (IV SOLUTION) ×2 IMPLANT
PACK CYSTO (CUSTOM PROCEDURE TRAY) ×2 IMPLANT
SET ASPIRATION TUBING (TUBING) ×2 IMPLANT
SYRINGE IRR TOOMEY STRL 70CC (SYRINGE) IMPLANT
TUBING CONNECTING 10 (TUBING) ×2 IMPLANT
TUBING UROLOGY SET (TUBING) ×2 IMPLANT

## 2018-09-09 NOTE — Anesthesia Preprocedure Evaluation (Addendum)
Anesthesia Evaluation  Patient identified by MRN, date of birth, ID band Patient awake    Reviewed: Allergy & Precautions, NPO status , Patient's Chart, lab work & pertinent test results, reviewed documented beta blocker date and time   Airway Mallampati: IV  TM Distance: >3 FB Neck ROM: Full  Mouth opening: Limited Mouth Opening  Dental no notable dental hx. (+) Edentulous Upper, Dental Advisory Given   Pulmonary neg pulmonary ROS, former smoker,    Pulmonary exam normal breath sounds clear to auscultation       Cardiovascular hypertension, Pt. on medications and Pt. on home beta blockers + CAD and + CABG (1994)  Normal cardiovascular exam Rhythm:Regular Rate:Normal  Cardiac catheterization 2010 showed occluded SVG to RCA and SVG to diagonal, although patent LIMA to LAD. The RCA was occluded with left to right collaterals and the circumflex was nonobstructive. He has been managed medically over time  TTE Multivessel, LVEF 55-60%, basal inferior hypokinesis, known graft disease   Neuro/Psych negative neurological ROS  negative psych ROS   GI/Hepatic negative GI ROS, Neg liver ROS,   Endo/Other  negative endocrine ROS  Renal/GU negative Renal ROS  negative genitourinary   Musculoskeletal negative musculoskeletal ROS (+)   Abdominal   Peds  Hematology negative hematology ROS (+)   Anesthesia Other Findings Bladder tumor On plavix, last dose 09/03/18  Reproductive/Obstetrics                            Anesthesia Physical Anesthesia Plan  ASA: III  Anesthesia Plan: General   Post-op Pain Management:    Induction: Intravenous  PONV Risk Score and Plan: Ondansetron, Dexamethasone and Treatment may vary due to age or medical condition  Airway Management Planned: LMA and Oral ETT  Additional Equipment:   Intra-op Plan:   Post-operative Plan: Extubation in OR  Informed Consent: I  have reviewed the patients History and Physical, chart, labs and discussed the procedure including the risks, benefits and alternatives for the proposed anesthesia with the patient or authorized representative who has indicated his/her understanding and acceptance.   Dental advisory given  Plan Discussed with: CRNA  Anesthesia Plan Comments:         Anesthesia Quick Evaluation

## 2018-09-09 NOTE — Anesthesia Postprocedure Evaluation (Signed)
Anesthesia Post Note  Patient: Rodney Barnett  Procedure(s) Performed: TRANSURETHRAL RESECTION OF BLADDER TUMOR (TURBT) WITH INSTILLATION OF POST OPERATIVE CHEMOTHERAPY (N/A Bladder)     Patient location during evaluation: PACU Anesthesia Type: General Level of consciousness: awake and alert Pain management: pain level controlled Vital Signs Assessment: post-procedure vital signs reviewed and stable Respiratory status: spontaneous breathing, nonlabored ventilation, respiratory function stable and patient connected to nasal cannula oxygen Cardiovascular status: blood pressure returned to baseline and stable Postop Assessment: no apparent nausea or vomiting Anesthetic complications: no    Last Vitals:  Vitals:   09/09/18 0845 09/09/18 0900  BP: 120/63 133/69  Pulse: 63 65  Resp: 13 15  Temp:    SpO2: 99% 97%    Last Pain:  Vitals:   09/09/18 0900  TempSrc:   PainSc: 5                  Ariane Ditullio L Danni Shima

## 2018-09-09 NOTE — Transfer of Care (Signed)
Immediate Anesthesia Transfer of Care Note  Patient: Rodney Barnett [facility]  Procedure(s) Performed: TRANSURETHRAL RESECTION OF BLADDER TUMOR (TURBT) WITH INSTILLATION OF POST OPERATIVE CHEMOTHERAPY (N/A Bladder)  Patient Location: PACU  Anesthesia Type:General  Level of Consciousness: sedated  Airway & Oxygen Therapy: Patient Spontanous Breathing and Patient connected to face mask oxygen  Post-op Assessment: Report given to RN and Post -op Vital signs reviewed and stable  Post vital signs: Reviewed and stable  Last Vitals:  Vitals Value Taken Time  BP 100/56 09/09/2018  8:10 AM  Temp    Pulse 63 09/09/2018  8:12 AM  Resp 17 09/09/2018  8:12 AM  SpO2 96 % 09/09/2018  8:12 AM  Vitals shown include unvalidated device data.  Last Pain:  Vitals:   09/09/18 0611  TempSrc:   PainSc: 0-No pain         Complications: No apparent anesthesia complications

## 2018-09-09 NOTE — Op Note (Signed)
PATIENT:  Rodney Barnett  PRE-OPERATIVE DIAGNOSIS: Bladder tumor  POST-OPERATIVE DIAGNOSIS: Same  PROCEDURE:  Procedure(s): 1. TRANSURETHRAL RESECTION OF BLADDER TUMOR (TURBT) (1.7cm.) 2. Instillation of intravesical chemotherapy (Gemcitabine)  SURGEON:  Surgeon(s): Claybon Jabs  ANESTHESIA:   General  EBL:  Minimal  DRAINS: Urethral catheter (16 Fr. Foley)   SPECIMEN:  Bladder tumor  DISPOSITION OF SPECIMEN:  PATHOLOGY  Indication: [name] is a 77 year old male who experienced gross hematuria and has a 120-pack-year smoking history and takes Plavix.  Evaluation with a CT scan revealed no abnormality of the upper tract.  Cystoscopically he was found to have a bladder tumor on the right wall posteriorly.  He is brought to the operating room today for transurethral resection of his bladder tumor and instillation of intravesical gemcitabine postoperatively.  Description of operation: The patient was taken to the operating room and administered general anesthesia. They were then placed on the table and moved to the dorsal lithotomy position after which the genitalia was sterilely prepped and draped. An official timeout was then performed.  The 38 French resectoscope with the 30 lens and visual obturator were then passed into the bladder under direct visualization. Urethra appeared normal. The visual obturator was then removed and the Gyrus resectoscope element with 30  lens was then inserted and the bladder was fully and systematically inspected. Ureteral orifices were noted to be in the normal anatomic positions.  The prostate revealed elongation with bilobar hypertrophy but no lesions.  The bladder wall had 1-2+ trabeculation.  His bladder tumor was visualized on the posterior wall on the right-hand side and photographed.  The bladder was then inspected with the 70 degree lens.  After full and systematic inspection only this single tumor was identified.  I first began by resecting the  bladder tumor.  I then fulgurated the floor of the resection and the surrounding mucosa.  Reinspection of the bladder revealed all obvious tumor had been fully resected and there was no evidence of perforation. The Microvasive evacuator was then used to irrigate the bladder and remove all of the portions of bladder tumor which were sent to pathology. I then removed the resectoscope.  A 16 French Foley catheter was then inserted in the bladder and irrigated. The irrigant returned clear with no clots. The patient was awakened and taken to the recovery room.  While in the recovery room 2 g of gemcitabine in 52.6 cc of sterile water was instilled in the bladder through the catheter and the catheter was plugged. This will remain indwelling for approximately one hour. It will then be drained from the bladder and the catheter will be removed and the patient discharged home.  PLAN OF CARE: Discharge to home after PACU  PATIENT DISPOSITION:  PACU - hemodynamically stable.

## 2018-09-09 NOTE — Anesthesia Procedure Notes (Signed)
Procedure Name: LMA Insertion Date/Time: 09/09/2018 7:25 AM Performed by: Lind Covert, CRNA Pre-anesthesia Checklist: Patient identified, Emergency Drugs available, Suction available, Patient being monitored and Timeout performed Patient Re-evaluated:Patient Re-evaluated prior to induction Oxygen Delivery Method: Circle system utilized Preoxygenation: Pre-oxygenation with 100% oxygen Induction Type: IV induction LMA: LMA inserted LMA Size: 5.0 Tube type: Oral Number of attempts: 1 Placement Confirmation: positive ETCO2 Tube secured with: Tape Dental Injury: Teeth and Oropharynx as per pre-operative assessment

## 2018-09-09 NOTE — H&P (Signed)
HPI: Rodney Barnett is a 77 year-old male with a newly diagnosed bladder tumor.  He first noticed the symptoms 08/18/2013. He did see the blood in his urine. He has seen blood clots.   He does have a burning sensation when he urinates. He is not currently having trouble urinating.   He has not had kidney stones. He is not having pain. He has not recently had unwanted weight loss.   08/04/18: He was initially referred for gross hematuria in 11/14 but failed to show for his appointment. On 07/04/18 he reported experiencing gross hematuria again and this time he did have some associated dysuria however his urine culture showed no evidence of infection. He has a 120-pack-year smoking history and takes Plavix and aspirin.  He reports to me that when he was experiencing his gross hematuria he also passed multiple small pieces of "skin". His hematuria persisted for 2 weeks and then cleared spontaneously. Others had kidney cancer but there is no history of urothelial malignancy in the family. He has no history of stones. He denies any flank pain or other unexplained pain. He has nocturia x2 but voids with good force of his stream and denies any dysuria. He said he did have a UTI 6 years ago for which he was treated with an antibiotic.   08/18/18: He returns today for completion of his workup gross hematuria. He continues to have intermittent hematuria. He said he bled up until this afternoon and then his urine cleared. He remains. He said he takes it because he underwent CABG. He does not have atrial fibrillation. He said he does not believe he stop the medication previously.     ALLERGIES: Lipitor    MEDICATIONS: Aspirin  Crestor 20 mg tablet  Lisinopril 10 mg tablet  Metoprolol Tartrate 25 mg tablet  Plavix 75 mg tablet  Isosorbide Mononitrate Er 30 mg tablet, extended release 24 hr  Neurontin 300 mg capsule  Nitroglycerin  Norco 10 mg-325 mg tablet     GU PSH: Locm 300-399Mg /Ml Iodine,1Ml -  08/16/2018    NON-GU PSH: None   GU PMH: BPH w/LUTS, He has BPH by exam. I noted no abnormality of the prostate on his DRE today. - 08/04/2018 Gross hematuria, We discussed the fact that I am concerned about the "skin" pieces that he has been passing. This is concerning possible urothelial malignancy as a cause of his gross hematuria specially with his smoking history. Although he has quit 20 years ago he is at risk and therefore will have his creatinine checked today. I will schedule him for a CT scan to evaluate his upper tract and he will then return for completion of his hematuria workup with cystoscopy. - 08/04/2018 Nocturia, It has been over a year since his last PSA. With his gross hematuria I will obtain a PSA today. - 08/04/2018    NON-GU PMH: Encounter for general adult medical examination without abnormal findings, Encounter for preventive health examination    FAMILY HISTORY: 2 sons - Runs in Family   SOCIAL HISTORY: Marital Status: Widowed Preferred Language: English; Ethnicity: Not Hispanic Or Latino; Race: White Current Smoking Status: Patient does not smoke anymore.   Tobacco Use Assessment Completed: Used Tobacco in last 30 days? Has never drank.  Drinks 3 caffeinated drinks per day.    REVIEW OF SYSTEMS:    GU Review Male:   Patient denies frequent urination, hard to postpone urination, burning/ pain with urination, get up at night to urinate, leakage of  urine, stream starts and stops, trouble starting your stream, have to strain to urinate , erection problems, and penile pain.  Gastrointestinal (Upper):   Patient denies nausea, vomiting, and indigestion/ heartburn.  Gastrointestinal (Lower):   Patient denies diarrhea and constipation.  Constitutional:   Patient denies fever, night sweats, weight loss, and fatigue.  Skin:   Patient denies skin rash/ lesion and itching.  Eyes:   Patient denies blurred vision and double vision.  Ears/ Nose/ Throat:   Patient denies sore  throat and sinus problems.  Hematologic/Lymphatic:   Patient denies swollen glands and easy bruising.  Cardiovascular:   Patient denies chest pains and leg swelling.  Respiratory:   Patient denies cough and shortness of breath.  Endocrine:   Patient denies excessive thirst.  Musculoskeletal:   Patient denies back pain and joint pain.  Neurological:   Patient denies headaches and dizziness.  Psychologic:   Patient denies depression and anxiety.   VITAL SIGNS:    Weight 265 lb / 120.2 kg  Height 74 in / 187.96 cm  BP 163/88 mmHg  Pulse 59 /min  BMI 34.0 kg/m   GU PHYSICAL EXAMINATION:    Anus and Perineum: No hemorrhoids. No anal stenosis. No rectal fissure, no anal fissure. No edema, no dimple, no perineal tenderness, no anal tenderness.  Scrotum: No lesions. No edema. No cysts. No warts.  Epididymides: Right: no spermatocele, no masses, no cysts, no tenderness, no induration, no enlargement. Left: no spermatocele, no masses, no cysts, no tenderness, no induration, no enlargement.  Testes: No tenderness, no swelling, no enlargement left testes. No tenderness, no swelling, no enlargement right testes. Normal location left testes. Normal location right testes. No mass, no cyst, no varicocele, no hydrocele left testes. No mass, no cyst, no varicocele, no hydrocele right testes.  Urethral Meatus: Normal size. No lesion, no wart, no discharge, no polyp. Normal location.  Penis: Circumcised, no warts, no cracks. No dorsal Peyronie's plaques, no left corporal Peyronie's plaques, no right corporal Peyronie's plaques, no scarring, no warts. No balanitis, no meatal stenosis.  Prostate: 40 gram or 2+ size. Left lobe normal consistency, right lobe normal consistency. Symmetrical lobes. No prostate nodule. Left lobe no tenderness, right lobe no tenderness.  Seminal Vesicles: Nonpalpable.  Sphincter Tone: Normal sphincter. No rectal tenderness. No rectal mass.    MULTI-SYSTEM PHYSICAL EXAMINATION:     Constitutional: Well-nourished. No physical deformities. Normally developed. Good grooming.  Neck: Neck symmetrical, not swollen. Normal tracheal position.  Respiratory: No labored breathing, no use of accessory muscles.   Cardiovascular: Normal temperature, normal extremity pulses, no swelling, no varicosities.  Lymphatic: No enlargement of neck, axillae, groin.  Skin: No paleness, no jaundice, no cyanosis. No lesion, no ulcer, no rash.  Neurologic / Psychiatric: Oriented to time, oriented to place, oriented to person. No depression, no anxiety, no agitation.  Gastrointestinal: No mass, no tenderness, no rigidity, non obese abdomen.  Eyes: Normal conjunctivae. Normal eyelids.  Ears, Nose, Mouth, and Throat: Left ear no scars, no lesions, no masses. Right ear no scars, no lesions, no masses. Nose no scars, no lesions, no masses. Normal hearing. Normal lips.  Musculoskeletal: Normal gait and station of head and neck.        PAST DATA REVIEWED:  Source Of History:  Patient  Lab Test Review:   BUN/Creatinine  Records Review:   Previous Patient Records  X-Ray Review: C.T. Hematuria: Reviewed Films. Reviewed Report. Discussed With Patient. EXAM: CT ABDOMEN AND PELVIS WITHOUT AND WITH CONTRAST  TECHNIQUE: Multidetector CT imaging of the abdomen and pelvis was performed following the standard protocol before and following the bolus administration of intravenous contrast. CONTRAST: 125 cc of Omnipaque 300 COMPARISON: None. FINDINGS: Lower chest: No acute abnormality. Hepatobiliary: Left lobe of liver cyst measures 1.6 cm, image 14/5. Multiple stones are identified within the gallbladder measuring up to 6 mm, image 23/5. No biliary ductal dilatation. Pancreas: Unremarkable. No pancreatic ductal dilatation or surrounding inflammatory changes. Spleen: Normal in size without focal abnormality. Adrenals/Urinary Tract: Normal appearance of the adrenal glands. There are no kidney stones identified. No  hydronephrosis or hydroureter. Within the right posterior wall of the urinary bladder there is a enhancing lesion measuring 1.7 cm, image 92/601 and image 114/600. no additional urinary tract lesions identified. Stomach/Bowel: Stomach is within normal limits. Appendix appears normal. No evidence of bowel wall thickening, distention, or inflammatory changes. Scattered distal colonic diverticula identified without acute inflammation. Vascular/Lymphatic: Aortic atherosclerosis. Infrarenal abdominal aortic ectasia measures 2.5 cm, image 44/5. No abdominopelvic adenopathy identified. Reproductive: Prostate is unremarkable. Other: Right inguinal hernia contains fat only. Musculoskeletal: Spondylosis identified within the lumbar spine. No aggressive lytic or sclerotic bone lesions. IMPRESSION: 1. Right posterior wall of urinary bladder lesion measuring 1.7 cm. Worrisome for urothelial carcinoma. No additional urinary tract lesions identified. 2. No enlarged abdominopelvic lymph nodes or evidence of metastatic disease. 3. No urinary tract calculi. 4. Aortic Atherosclerosis (ICD10-I70.0). 5. Ectatic abdominal aorta at risk for aneurysm development. Recommend followup by ultrasound in 5 years. This recommendation follows ACR consensus guidelines: White Paper of the ACR Incidental Findings Committee II on Vascular Findings. J Am Coll Radiol 2013; 44:010-272. 6. Gallstones    05/17/17  PSA  Total PSA 0.76 ng/dl    08/04/18  General Chemistry  Creatinine 1.2 mg/dL   PROCEDURES:         Flexible Cystoscopy on 08/18/18  Meatus:  Normal size. Normal location. Normal condition.  Urethra:  No strictures.  External Sphincter:  Normal.  Verumontanum:  Normal.  Prostate:  Borderline obstructing. Moderate hyperplasia.  Bladder Neck:  Non-obstructing.  Ureteral Orifices:  Normal location. Normal size. Normal shape. Effluxed clear urine.  Bladder:  Mild trabeculation. A posterior wall tumor. 1 1/2 cm tumor. Normal  mucosa. No stones.          Urinalysis w/Scope Dipstick Dipstick Cont'd Micro  Color: Yellow Bilirubin: Neg mg/dL WBC/hpf: 0 - 5/hpf  Appearance: Clear Ketones: Neg mg/dL RBC/hpf: 0 - 2/hpf  Specific Gravity: 1.015 Blood: 1+ ery/uL Bacteria: Rare (0-9/hpf)  pH: 5.5 Protein: Neg mg/dL Cystals: NS (Not Seen)  Glucose: Neg mg/dL Urobilinogen: 0.2 mg/dL Casts: NS (Not Seen)    Nitrites: Neg Trichomonas: Not Present    Leukocyte Esterase: Neg leu/uL Mucous: Not Present      Epithelial Cells: 0 - 5/hpf      Yeast: NS (Not Seen)      Sperm: Not Present    ASSESSMENT/PLAN:     ICD-10 Details  1 GU:   Gross hematuria - R31.0 Stable - His gross hematuria is secondary to the lesion identified within his bladder today.  2   Bladder tumor/neoplasm - D41.4 He has a bladder tumor that is located on the floor of his bladder behind the right ureteral orifice. It does not appear to involve the U0.           Notes:   I went over the results of the CT scan as well as my cystoscopic findings today which have revealed a bladder  mass consistent with transitional cell carcinoma. We discussed the fact that currently there is no evidence of extravesical extension or pelvic adenopathy based on CT scan findings. Further characterization of the lesion is required for grading and staging purposes. We discussed proceeding with evaluation using transurethral resection of the lesion. I have discussed the procedure in detail as well as the potential risks and complications associated with this form of surgery. We also discussed the probability of successful resection of the intravesical portion of this lesion. I have recommended, as long as there is no contraindication at the time of surgery, the placement of intravesical mitomycin-C in order to reduce the risk of recurrence. We did discuss the potential side effects of this form of intravesical chemotherapy. The procedure will be performed under anesthesia as an outpatient.    He takes Plavix and will need to be off of this for 5 days prior to his TURBT. He has not stopped before but I am not sure exactly why he is on it currently. I need to get clearance and then will proceed with his surgery.

## 2018-09-09 NOTE — Discharge Instructions (Signed)

## 2018-09-12 ENCOUNTER — Encounter (HOSPITAL_COMMUNITY): Payer: Self-pay | Admitting: Urology

## 2018-09-21 ENCOUNTER — Other Ambulatory Visit: Payer: Self-pay | Admitting: Family Medicine

## 2018-11-17 ENCOUNTER — Other Ambulatory Visit: Payer: Self-pay | Admitting: Family Medicine

## 2018-11-17 NOTE — Telephone Encounter (Signed)
Pt needs office follow up.  We had instructed him that we are required to see patients on chronic opioids every 3 months- due to state medical board regulations.

## 2018-11-17 NOTE — Telephone Encounter (Signed)
Last rx given on 11/15 for #90 with no ref

## 2018-11-17 NOTE — Telephone Encounter (Signed)
Requested medication (s) are due for refill today: yes  Requested medication (s) are on the active medication list: yes  Last refill:  08/19/18  Future visit scheduled: yes  Notes to clinic:  Medication not delegated to NT to refill   Requested Prescriptions  Pending Prescriptions Disp Refills   HYDROcodone-acetaminophen (NORCO) 10-325 MG tablet 90 tablet 0    Sig: Take 1 tablet by mouth every 6 (six) hours as needed.     Not Delegated - Analgesics:  Opioid Agonist Combinations Failed - 11/17/2018  8:29 AM      Failed - This refill cannot be delegated      Passed - Urine Drug Screen completed in last 360 days.      Passed - Valid encounter within last 6 months    Recent Outpatient Visits          3 months ago Chronic low back pain without sciatica, unspecified back pain laterality   [facility]rap[facility], [facility] at [facility], Alinda Sierras, MD   4 months ago Hematuria, unspecified type   Therapist, [facility] at [facility], Alinda Sierras, MD   6 months ago Chronic low back pain without sciatica, unspecified back pain laterality   Therapist, music at [place], MD   9 months ago Chronic low back pain without sciatica, unspecified back pain laterality   Therapist, [facility] at [facility], Alinda Sierras, MD   1 year ago Chronic low back pain without sciatica, unspecified back pain laterality   Therapist, music at [place], MD      Future Appointments            In 3 months Bur[place], Alinda Sierras, MD [place] [facility] [place], [facility]

## 2018-11-17 NOTE — Telephone Encounter (Signed)
I called the pt and scheduled an appt for tomorrow-2/14 at 11:50am.

## 2018-11-17 NOTE — Telephone Encounter (Signed)
Copied from [place] [phone]. Topic: Quick Communication - Rx Refill/Question >> Nov 17, 2018  8:25 AM Alanda Slim E wrote: Medication: HYDROcodone-acetaminophen (NORCO) 10-325 MG tablet   Has the patient contacted their pharmacy? no  Preferred Pharmacy (with phone number or street name): [facility], [place] [phone] (Phone) [phone] (Fax)    Agent: Please be advised that RX refills may take up to 3 business days. We ask that you follow-up with your pharmacy.

## 2018-11-18 ENCOUNTER — Ambulatory Visit: Payer: Medicare Other | Admitting: Family Medicine

## 2018-11-21 ENCOUNTER — Encounter: Payer: Self-pay | Admitting: Family Medicine

## 2018-11-21 ENCOUNTER — Ambulatory Visit (INDEPENDENT_AMBULATORY_CARE_PROVIDER_SITE_OTHER): Payer: Medicare Other | Admitting: Family Medicine

## 2018-11-21 VITALS — BP 134/70 | HR 70 | Temp 97.6°F | Ht 76.0 in | Wt 261.3 lb

## 2018-11-21 DIAGNOSIS — G8929 Other chronic pain: Secondary | ICD-10-CM

## 2018-11-21 DIAGNOSIS — M545 Low back pain, unspecified: Secondary | ICD-10-CM

## 2018-11-21 DIAGNOSIS — C674 Malignant neoplasm of posterior wall of bladder: Secondary | ICD-10-CM

## 2018-11-21 DIAGNOSIS — C679 Malignant neoplasm of bladder, unspecified: Secondary | ICD-10-CM | POA: Insufficient documentation

## 2018-11-21 MED ORDER — GABAPENTIN 300 MG PO CAPS: ORAL_CAPSULE | ORAL | 1 refills | Status: DC

## 2018-11-21 MED ORDER — HYDROCODONE-ACETAMINOPHEN 10-325 MG PO TABS: 1.0000 | ORAL_TABLET | Freq: Four times a day (QID) | ORAL | 0 refills | Status: DC | PRN

## 2018-11-21 NOTE — Progress Notes (Signed)
  Subjective:     Patient ID: Rodney Barnett, male   DOB: [birth year], 78 y.o.   MRN: [id number]  HPI Patient is seen for follow-up regarding chronic back pain.  He takes hydrocodone 10 mg 1/2 tablet twice daily and has been on this regimen for many years.  He states this helps him to be more functional.  He has not gotten relief with over-the-counter plain Tylenol.  No alcohol use.  He had recent surgery December 6 for bladder cancer and that went well.  Will have close follow-up every few months with urology.  Patient did receive a few tramadol following his surgery but that should not in any way violate his contract  Feels his back pain is currently well controlled.  He gets 90 tablets of hydrocodone which last him for 3 months.  Denies any constipation or other side effects from medication.  He does not use any benzodiazepines.  He is on chronic gabapentin and needs refills of that as well.  Past Medical History:  Diagnosis Date  . Chronic back pain   . Chronic rhinitis   . Coronary atherosclerosis of native coronary artery    Multivessel, LVEF 55-60%, basal inferior hypokinesis, known graft disease  . Erectile dysfunction   . Essential hypertension, benign   . Insomnia   . Lipoma   . Mixed hyperlipidemia   . Peripheral neuropathy    Past Surgical History:  Procedure Laterality Date  . CORONARY ARTERY BYPASS GRAFT  1994   LIMA to LAD, SVG to RCA, SVG to diagonal  . TONSILLECTOMY    . TRANSURETHRAL RESECTION OF BLADDER TUMOR N/A 09/09/2018   Procedure: TRANSURETHRAL RESECTION OF BLADDER TUMOR (TURBT) WITH INSTILLATION OF Barnett OPERATIVE CHEMOTHERAPY;  Surgeon: Kathie Rhodes, MD;  Location: [facility];  Service: Urology;  Laterality: N/A;    reports that he quit smoking about 25 years ago. His smoking use included cigarettes. He started smoking about 64 years ago. He has a 120.00 pack-year smoking history. He has never used smokeless tobacco. He reports that he does not drink alcohol or use  drugs. family history includes Cancer in his brother; Hypertension in an other family member. Allergies  Allergen Reactions  . Lipitor [Atorvastatin] Other (See Comments)    myalgia     Review of Systems  Constitutional: Negative for appetite change and unexpected weight change.  Genitourinary: Negative for dysuria and hematuria.  Musculoskeletal: Positive for back pain.  Neurological: Negative for weakness and numbness.       Objective:   Physical Exam Constitutional:      Appearance: Normal appearance.  Cardiovascular:     Rate and Rhythm: Normal rate and regular rhythm.     Pulses: Normal pulses.     Heart sounds: Normal heart sounds.  Musculoskeletal:     Right lower leg: No edema.     Left lower leg: No edema.  Neurological:     Mental Status: He is alert.     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  He will have 56-month follow-up  Rodney Post MD [facility]

## 2018-12-05 ENCOUNTER — Other Ambulatory Visit: Payer: Self-pay | Admitting: Family Medicine

## 2019-01-04 ENCOUNTER — Other Ambulatory Visit: Payer: Self-pay | Admitting: Cardiology

## 2019-01-04 ENCOUNTER — Other Ambulatory Visit: Payer: Self-pay | Admitting: Family Medicine

## 2019-01-26 ENCOUNTER — Other Ambulatory Visit: Payer: Self-pay | Admitting: Cardiology

## 2019-02-02 ENCOUNTER — Telehealth: Payer: Self-pay | Admitting: Cardiology

## 2019-02-02 NOTE — Telephone Encounter (Signed)
Virtual Visit Pre-Appointment Phone Call  "(Name), I am calling you today to discuss your upcoming appointment. We are currently trying to limit exposure to the virus that causes COVID-19 by seeing patients at home rather than in the office."  "What is the BEST phone number to call the day of the visit?" - [phone] Do you have or have access to (through a family member/friend) a smartphone with video capability that we can use for your visit?" a. If yes - list this number in appt notes as cell (if different from BEST phone #) and list the appointment type as a VIDEO visit in appointment notes b. If no - list the appointment type as a PHONE visit in appointment notes  2. Confirm consent - "In the setting of the current Covid19 crisis, you are scheduled for a (phone or video) visit with your provider on (date) at (time).  Just as we do with many in-office visits, in order for you to participate in this visit, we must obtain consent.  If you'd like, I can send this to your mychart (if signed up) or email for you to review.  Otherwise, I can obtain your verbal consent now.  All virtual visits are billed to your insurance company just like a normal visit would be.  By agreeing to a virtual visit, we'd like you to understand that the technology does not allow for your provider to perform an examination, and thus may limit your provider's ability to fully assess your condition. If your provider identifies any concerns that need to be evaluated in person, we will make arrangements to do so.  Finally, though the technology is pretty good, we cannot assure that it will always work on either your or our end, and in the setting of a video visit, we may have to convert it to a phone-only visit.  In either situation, we cannot ensure that we have a secure connection.  Are you willing to proceed?" STAFF: Did the patient verbally acknowledge consent to telehealth visit? Document YES/NO here: YES   3. Advise  patient to be prepared - "Two hours prior to your appointment, go ahead and check your blood pressure, pulse, oxygen saturation, and your weight (if you have the equipment to check those) and write them all down. When your visit starts, your provider will ask you for this information. If you have an Apple Watch or Kardia device, please plan to have heart rate information ready on the day of your appointment. Please have a pen and paper handy nearby the day of the visit as well."  4. Give patient instructions for MyChart download to smartphone OR Doximity/Doxy.me as below if video visit (depending on what platform provider is using)  5. Inform patient they will receive a phone call 15 minutes prior to their appointment time (may be from unknown caller ID) so they should be prepared to answer    TELEPHONE CALL NOTE  Rodney Barnett has been deemed a candidate for a follow-up tele-health visit to limit community exposure during the Covid-19 pandemic. I spoke with the patient via phone to ensure availability of phone/video source, confirm preferred email & phone number, and discuss instructions and expectations.  I reminded Rodney Barnett to be prepared with any vital sign and/or heart rhythm information that could potentially be obtained via home monitoring, at the time of his visit. I reminded Rodney Barnett to expect a phone call prior to his visit.  Rodney Barnett  02/02/2019 1:32 PM   INSTRUCTIONS FOR DOWNLOADING THE MYCHART APP TO SMARTPHONE  - The patient must first make sure to have activated MyChart and know their login information - If Apple, go to [employer] and type in MyChart in the search bar and download the app. If Android, ask patient to go to [employer] and type in [place] in the search bar and download the app. The app is free but as with any other app downloads, their phone may require them to verify saved payment information or Apple/Android password.  - The patient will need to  then log into the app with their MyChart username and password, and select [facility] as their healthcare provider to link the account. When it is time for your visit, go to the MyChart app, find appointments, and click Begin Video Visit. Be sure to Select Allow for your device to access the Microphone and Camera for your visit. You will then be connected, and your provider will be with you shortly.  **If they have any issues connecting, or need assistance please contact MyChart service desk [phone])**  **If using a computer, in order to ensure the best quality for their visit they will need to use either of the following Internet Browsers: [employer], or Google Chrome**  IF USING DOXIMITY or DOXY.ME - The patient will receive a link just prior to their visit by text.     FULL LENGTH CONSENT FOR TELE-HEALTH VISIT   I hereby voluntarily request, consent and authorize [facility] and its employed or contracted physicians, physician assistants, nurse practitioners or other licensed health care professionals (the Practitioner), to provide me with telemedicine health care services (the Services") as deemed necessary by the treating Practitioner. I acknowledge and consent to receive the Services by the Practitioner via telemedicine. I understand that the telemedicine visit will involve communicating with the Practitioner through live audiovisual communication technology and the disclosure of certain medical information by electronic transmission. I acknowledge that I have been given the opportunity to request an in-person assessment or other available alternative prior to the telemedicine visit and am voluntarily participating in the telemedicine visit.  I understand that I have the right to withhold or withdraw my consent to the use of telemedicine in the course of my care at any time, without affecting my right to future care or treatment, and that the Practitioner or I may  terminate the telemedicine visit at any time. I understand that I have the right to inspect all information obtained and/or recorded in the course of the telemedicine visit and may receive copies of available information for a reasonable fee.  I understand that some of the potential risks of receiving the Services via telemedicine include:   Delay or interruption in medical evaluation due to technological equipment failure or disruption;  Information transmitted may not be sufficient (e.g. poor resolution of images) to allow for appropriate medical decision making by the Practitioner; and/or   In rare instances, security protocols could fail, causing a breach of personal health information.  Furthermore, I acknowledge that it is my responsibility to provide information about my medical history, conditions and care that is complete and accurate to the best of my ability. I acknowledge that Practitioner's advice, recommendations, and/or decision may be based on factors not within their control, such as incomplete or inaccurate data provided by me or distortions of diagnostic images or specimens that may result from electronic transmissions. I understand that the practice of medicine  is not an Chief Strategy Officer and that Practitioner makes no warranties or guarantees regarding treatment outcomes. I acknowledge that I will receive a copy of this consent concurrently upon execution via email to the email address I last provided but may also request a printed copy by calling the office of [facility].    I understand that my insurance will be billed for this visit.   I have read or had this consent read to me.  I understand the contents of this consent, which adequately explains the benefits and risks of the Services being provided via telemedicine.   I have been provided ample opportunity to ask questions regarding this consent and the Services and have had my questions answered to my satisfaction.  I  give my informed consent for the services to be provided through the use of telemedicine in my medical care  By participating in this telemedicine visit I agree to the above.

## 2019-02-06 ENCOUNTER — Other Ambulatory Visit: Payer: Self-pay | Admitting: Family Medicine

## 2019-02-06 NOTE — Progress Notes (Signed)
Virtual Visit via Telephone Note   This visit type was conducted due to national recommendations for restrictions regarding the COVID-19 Pandemic (e.g. social distancing) in an effort to limit this patient's exposure and mitigate transmission in our community.  Due to his co-morbid illnesses, this patient is at least at moderate risk for complications without adequate follow up.  This format is felt to be most appropriate for this patient at this time.  The patient did not have access to video technology/had technical difficulties with video requiring transitioning to audio format only (telephone).  All issues noted in this document were discussed and addressed.  No physical exam could be performed with this format.  Please refer to the patient's chart for his  consent to telehealth for [facility].   Date:  02/08/2019   ID:  Rodney Barnett, DOB 1941/09/05, MRN [id number]  Patient Location: Home Provider Location: Office  PCP:  [name], MD  Cardiologist:  Rozann Lesches, MD  Evaluation Performed:  Follow-Up Visit  Chief Complaint:  Cardiac follow-up  History of Present Illness:    Rodney Barnett is a 78 y.o. male last seen in May 2019.  He did not have video access and we spoke by phone today.  He does not report any significant angina symptoms, no nitroglycerin use or breathlessness beyond NYHA class II with typical activities.  We have been managing him medically with history of previously documented graft disease.  I reviewed his current regimen which is outlined below, he does not report any intolerances.  We did discuss getting a fresh bottle of nitroglycerin to have available.  I reviewed his chart, he does have a history of bladder tumor and underwent transurethral resection of the tumor and treatment with chemotherapy late last year.  Reportedly, he is doing well at this time and continues to follow with Dr. Karsten Ro.  The patient does not have symptoms concerning for  COVID-19 infection (fever, chills, cough, or new shortness of breath).  He states that he has been social distancing.  His grandson who lives nearby gets groceries for him.   Past Medical History:  Diagnosis Date  . Chronic back pain   . Chronic rhinitis   . Coronary atherosclerosis of native coronary artery    Multivessel, LVEF 55-60%, basal inferior hypokinesis, known graft disease  . Erectile dysfunction   . Essential hypertension   . Insomnia   . Lipoma   . Mixed hyperlipidemia   . Peripheral neuropathy    Past Surgical History:  Procedure Laterality Date  . CORONARY ARTERY BYPASS GRAFT  1994   LIMA to LAD, SVG to RCA, SVG to diagonal  . TONSILLECTOMY    . TRANSURETHRAL RESECTION OF BLADDER TUMOR N/A 09/09/2018   Procedure: TRANSURETHRAL RESECTION OF BLADDER TUMOR (TURBT) WITH INSTILLATION OF POST OPERATIVE CHEMOTHERAPY;  Surgeon: Kathie Rhodes, MD;  Location: [facility];  Service: Urology;  Laterality: N/A;     Current Meds  Medication Sig  . aspirin 81 MG tablet Take 81 mg by mouth daily.    . clopidogrel (PLAVIX) 75 MG tablet TAKE 1 TABLET BY MOUTH ONCE DAILY.  Marland Kitchen gabapentin (NEURONTIN) 300 MG capsule TAKE (2) CAPSULES BY MOUTH THREE TIMES DAILY.  Marland Kitchen HYDROcodone-acetaminophen (NORCO) 10-325 MG tablet Take 1 tablet by mouth every 6 (six) hours as needed.  . isosorbide mononitrate (IMDUR) 30 MG 24 hr tablet TAKE 1 TABLET ONCE DAILY.  Marland Kitchen lisinopril (ZESTRIL) 10 MG tablet TAKE 1 TABLET DAILY.  . metoprolol tartrate (  LOPRESSOR) 25 MG tablet TAKE (1) TABLET TWICE DAILY.  . nitroGLYCERIN (NITROSTAT) 0.4 MG SL tablet Place 1 tablet (0.4 mg total) under the tongue every 5 (five) minutes x 3 doses as needed for chest pain (If no relief after 3rd dose, proceed to the ED for an evaluation or call 911).  . rosuvastatin (CRESTOR) 20 MG tablet TAKE 1 TABLET BY MOUTH ONCE DAILY.  . [DISCONTINUED] nitroGLYCERIN (NITROSTAT) 0.4 MG SL tablet Place 1 tablet (0.4 mg total) under the tongue every 5  (five) minutes as needed. (Patient taking differently: Place 0.4 mg under the tongue every 5 (five) minutes as needed for chest pain. )     Allergies:   Lipitor [atorvastatin]   Social History   Tobacco Use  . Smoking status: Former Smoker    Packs/day: 3.00    Years: 40.00    Pack years: 120.00    Types: Cigarettes    Start date: 10/05/1954    Last attempt to quit: 01/06/1993    Years since quitting: 26.1  . Smokeless tobacco: Never Used  Substance Use Topics  . Alcohol use: No    Alcohol/week: 0.0 standard drinks  . Drug use: No     Family Hx: The patient's family history includes Cancer in his brother; Hypertension in an other family member.  ROS:   Please see the history of present illness.    All other systems reviewed and are negative.   Prior CV studies:   The following studies were reviewed today:  No interval cardiac testing.  Labs/Other Tests and Data Reviewed:    EKG:  An ECG dated 02/03/2018 was personally reviewed today and demonstrated:  Sinus bradycardia.  Recent Labs: 05/18/2018: ALT 14 08/29/2018: BUN 12; Creatinine, Ser 1.11; Hemoglobin 15.4; Platelets 140; Potassium 4.8; Sodium 138   Recent Lipid Panel Lab Results  Component Value Date/Time   CHOL 115 05/18/2018 10:11 AM   TRIG 193.0 (H) 05/18/2018 10:11 AM   HDL 32.70 (L) 05/18/2018 10:11 AM   CHOLHDL 4 05/18/2018 10:11 AM   LDLCALC 44 05/18/2018 10:11 AM   LDLDIRECT 114.8 06/07/2012 09:37 AM    Wt Readings from Last 3 Encounters:  02/08/19 260 lb 3.2 oz (118 kg)  11/21/18 261 lb 4.8 oz (118.5 kg)  09/09/18 261 lb 6 oz (118.6 kg)     Objective:    Vital Signs:  BP 124/69   Pulse (!) 58   Ht 6\' 4"  (1.93 m)   Wt 260 lb 3.2 oz (118 kg)   SpO2 97%   BMI 31.67 kg/m    Patient was not breathless while speaking in full sentences on the phone. Voice tone and speech pattern were normal.  No audible wheezing.  ASSESSMENT & PLAN:    1.  Multivessel CAD status post CABG with known graft  disease, but no active angina on medical therapy.  He has generally preferred strategy of observation in the absence of symptoms.  He does not report any obvious decline in stamina or new exertional symptoms.  Continue antiplatelet regimen, long-acting nitrate, ACE inhibitor, beta-blocker, and statin.  2.  Essential hypertension, blood pressure is well controlled today.  No changes were made.  3.  Mixed hyperlipidemia, he continues on Crestor and continues to follow with Dr. Elease Hashimoto.  Last LDL was 44.  4.  Tobacco abuse in remission.  COVID-19 Education: The signs and symptoms of COVID-19 were discussed with the patient and how to seek care for testing (follow up with PCP or arrange  E-visit).  The importance of social distancing was discussed today.  Time:   Today, I have spent 6 minutes with the patient with telehealth technology discussing the above problems.     Medication Adjustments/Labs and Tests Ordered: Current medicines are reviewed at length with the patient today.  Concerns regarding medicines are outlined above.   Tests Ordered: No orders of the defined types were placed in this encounter.   Medication Changes: Meds ordered this encounter  Medications  . nitroGLYCERIN (NITROSTAT) 0.4 MG SL tablet    Sig: Place 1 tablet (0.4 mg total) under the tongue every 5 (five) minutes x 3 doses as needed for chest pain (If no relief after 3rd dose, proceed to the ED for an evaluation or call 911).    Dispense:  25 tablet    Refill:  3    Disposition:  Follow up 1 year, sooner if needed.  Signed, Rozann Lesches, MD  02/08/2019 9:35 AM    [place]

## 2019-02-08 ENCOUNTER — Telehealth (INDEPENDENT_AMBULATORY_CARE_PROVIDER_SITE_OTHER): Payer: Medicare Other | Admitting: Cardiology

## 2019-02-08 ENCOUNTER — Encounter: Payer: Self-pay | Admitting: Cardiology

## 2019-02-08 VITALS — BP 124/69 | HR 58 | Ht 76.0 in | Wt 260.2 lb

## 2019-02-08 DIAGNOSIS — E782 Mixed hyperlipidemia: Secondary | ICD-10-CM | POA: Diagnosis not present

## 2019-02-08 DIAGNOSIS — Z7189 Other specified counseling: Secondary | ICD-10-CM

## 2019-02-08 DIAGNOSIS — I10 Essential (primary) hypertension: Secondary | ICD-10-CM

## 2019-02-08 DIAGNOSIS — I25119 Atherosclerotic heart disease of native coronary artery with unspecified angina pectoris: Secondary | ICD-10-CM | POA: Diagnosis not present

## 2019-02-08 MED ORDER — NITROGLYCERIN 0.4 MG SL SUBL: 0.4000 mg | SUBLINGUAL_TABLET | SUBLINGUAL | 3 refills | Status: DC | PRN

## 2019-02-08 NOTE — Patient Instructions (Addendum)

## 2019-02-17 ENCOUNTER — Ambulatory Visit (INDEPENDENT_AMBULATORY_CARE_PROVIDER_SITE_OTHER): Payer: Medicare Other | Admitting: Family Medicine

## 2019-02-17 ENCOUNTER — Other Ambulatory Visit: Payer: Self-pay

## 2019-02-17 DIAGNOSIS — G8929 Other chronic pain: Secondary | ICD-10-CM

## 2019-02-17 DIAGNOSIS — Z1211 Encounter for screening for malignant neoplasm of colon: Secondary | ICD-10-CM

## 2019-02-17 DIAGNOSIS — M545 Low back pain, unspecified: Secondary | ICD-10-CM

## 2019-02-17 MED ORDER — HYDROCODONE-ACETAMINOPHEN 10-325 MG PO TABS: 1.0000 | ORAL_TABLET | Freq: Four times a day (QID) | ORAL | 0 refills | Status: DC | PRN

## 2019-02-17 NOTE — Progress Notes (Signed)
Patient ID: Rodney Barnett, male   DOB: [birth year], 78 y.o.   MRN: [id number]  This visit type was conducted due to national recommendations for restrictions regarding the COVID-19 pandemic in an effort to limit this patient's exposure and mitigate transmission in our community.   Virtual Visit via Telephone Note  I connected with Rodney Barnett on 02/17/19 at  9:45 AM EDT by telephone and verified that I am speaking with the correct person using two identifiers.   I discussed the limitations, risks, security and privacy concerns of performing an evaluation and management service by telephone and the availability of in person appointments. I also discussed with the patient that there may be a patient responsible charge related to this service. The patient expressed understanding and agreed to proceed.  Location patient: home Location provider: work or home office Participants present for the call: patient, provider Patient did not have a visit in the prior 7 days to address this/these issue(s).   History of Present Illness: Patient has chronic problems including history of bladder cancer, hypertension, peripheral neuropathy, hyperlipidemia, CAD, and chronic back pain.  This is a chronic back pain follow-up.  He takes just a half of a hydrocodone 10 mg twice a day and this has helped him to be more functional.  No constipation issues or other side effects.  He has not gotten relief with over-the-counter plain Tylenol.  No history of misuse.  He has 90 tablets which last 3 months.  He had drug screen back in August which was no concerns.  His last colonoscopy was over 10 years ago.  He is requesting referral for 1 more.  He has not had any recent bloody stools or other concerning symptoms.   Observations/Objective: Patient sounds cheerful and well on the phone. I do not appreciate any SOB. Speech and thought processing are grossly intact. Patient reported vitals:  Assessment and Plan: Chronic low  back pain-stable  -Refill hydrocodone 10 mg 1/2 tablet twice daily #90 -Set up 62-month office follow-up.  Will need repeat urine drug screen by then. -Set up repeat colonoscopy per patient request.  This would be his last.  Follow Up Instructions:  -As above   [id number] [id number] [id number] 11-20 [id number] 21-30 I did not refer this patient for an OV in the next 24 hours for this/these issue(s).  I discussed the assessment and treatment plan with the patient. The patient was provided an opportunity to ask questions and all were answered. The patient agreed with the plan and demonstrated an understanding of the instructions.   The patient was advised to call back or seek an in-person evaluation if the symptoms worsen or if the condition fails to improve as anticipated.  I provided 14 minutes of non-face-to-face time during this encounter.   Carolann Littler, MD

## 2019-03-07 ENCOUNTER — Other Ambulatory Visit: Payer: Self-pay | Admitting: Family Medicine

## 2019-04-05 ENCOUNTER — Other Ambulatory Visit: Payer: Self-pay | Admitting: Cardiology

## 2019-04-05 ENCOUNTER — Other Ambulatory Visit: Payer: Self-pay | Admitting: Family Medicine

## 2019-05-08 ENCOUNTER — Other Ambulatory Visit: Payer: Self-pay | Admitting: Family Medicine

## 2019-05-17 ENCOUNTER — Ambulatory Visit (INDEPENDENT_AMBULATORY_CARE_PROVIDER_SITE_OTHER): Payer: Medicare Other | Admitting: Family Medicine

## 2019-05-17 ENCOUNTER — Other Ambulatory Visit: Payer: Self-pay

## 2019-05-17 DIAGNOSIS — M545 Low back pain, unspecified: Secondary | ICD-10-CM

## 2019-05-17 DIAGNOSIS — G8929 Other chronic pain: Secondary | ICD-10-CM | POA: Diagnosis not present

## 2019-05-17 MED ORDER — HYDROCODONE-ACETAMINOPHEN 10-325 MG PO TABS: 1.0000 | ORAL_TABLET | Freq: Four times a day (QID) | ORAL | 0 refills | Status: DC | PRN

## 2019-05-17 NOTE — Progress Notes (Signed)
Patient ID: Rodney Barnett, male   DOB: 10-27-1940, 78 y.o.   MRN: [id number]  This visit type was conducted due to national recommendations for restrictions regarding the COVID-19 pandemic in an effort to limit this patient's exposure and mitigate transmission in our community.   Virtual Visit via Telephone Note  I connected with Rodney Barnett On 05/17/19 at 11:15 AM EDT by telephone and verified that I am speaking with the correct person using two identifiers.   I discussed the limitations, risks, security and privacy concerns of performing an evaluation and management service by telephone and the availability of in person appointments. I also discussed with the patient that there may be a patient responsible charge related to this service. The patient expressed understanding and agreed to proceed.  Location patient: home Location provider: work or home office Participants present for the call: patient, provider Patient did not have a visit in the prior 7 days to address this/these issue(s).   History of Present Illness: Patient is chronic problems including hypertension, history of bladder cancer, hyperlipidemia, CAD and some chronic low back pain.  He has known degenerative arthritis.  He has been on hydrocodone 10 mg 1/2 tablet twice daily for several years and tolerating well with no escalation of use.  He states that with this dose he is able to be more functional.  He obviously needs to avoid nonsteroidals.  No constipation or other side effects.  This is controlling his pain fairly well.  He is due for repeat drug screen as last was last August.  Also due for follow-up labs soon   Observations/Objective: Patient sounds cheerful and well on the phone. I do not appreciate any SOB. Speech and thought processing are grossly intact. Patient reported vitals:  Assessment and Plan:  Chronic low back pain stable on low-dose hydrocodone  -Recommend 65-month office follow-up in office for lab work  including lipids, comprehensive metabolic panel, urine drug screen -Refilled his hydrocodone for 3 months (#90 tablets/3 months)  Follow Up Instructions:  -As above in 3 months   99441 5-10 99442 11-20 99443 21-30 I did not refer this patient for an OV in the next 24 hours for this/these issue(s).  I discussed the assessment and treatment plan with the patient. The patient was provided an opportunity to ask questions and all were answered. The patient agreed with the plan and demonstrated an understanding of the instructions.   The patient was advised to call back or seek an in-person evaluation if the symptoms worsen or if the condition fails to improve as anticipated.  I provided 15 minutes of non-face-to-face time during this encounter.   Carolann Littler, MD

## 2019-05-25 ENCOUNTER — Other Ambulatory Visit: Payer: Self-pay | Admitting: Family Medicine

## 2019-06-06 ENCOUNTER — Other Ambulatory Visit: Payer: Self-pay | Admitting: Family Medicine

## 2019-06-06 ENCOUNTER — Other Ambulatory Visit: Payer: Self-pay | Admitting: Cardiology

## 2019-07-06 ENCOUNTER — Other Ambulatory Visit: Payer: Self-pay | Admitting: Cardiology

## 2019-08-16 ENCOUNTER — Other Ambulatory Visit: Payer: Self-pay

## 2019-08-16 ENCOUNTER — Ambulatory Visit (INDEPENDENT_AMBULATORY_CARE_PROVIDER_SITE_OTHER): Payer: Medicare Other | Admitting: Family Medicine

## 2019-08-16 ENCOUNTER — Encounter: Payer: Self-pay | Admitting: Family Medicine

## 2019-08-16 VITALS — HR 64 | Temp 97.8°F | Ht 76.0 in | Wt 266.3 lb

## 2019-08-16 DIAGNOSIS — I10 Essential (primary) hypertension: Secondary | ICD-10-CM

## 2019-08-16 DIAGNOSIS — E782 Mixed hyperlipidemia: Secondary | ICD-10-CM

## 2019-08-16 DIAGNOSIS — M25532 Pain in left wrist: Secondary | ICD-10-CM | POA: Diagnosis not present

## 2019-08-16 DIAGNOSIS — G8929 Other chronic pain: Secondary | ICD-10-CM

## 2019-08-16 DIAGNOSIS — M5442 Lumbago with sciatica, left side: Secondary | ICD-10-CM

## 2019-08-16 LAB — LIPID PANEL
Cholesterol: 133 mg/dL (ref 0–200)
HDL: 41.7 mg/dL (ref >39.00)
LDL Cholesterol: 68 mg/dL (ref 0–99)
NonHDL: 90.98
Total CHOL/HDL Ratio: 3
Triglycerides: 116 mg/dL (ref 0.0–149.0)
VLDL: 23.2 mg/dL (ref 0.0–40.0)

## 2019-08-16 LAB — BASIC METABOLIC PANEL WITH GFR
BUN: 15 mg/dL (ref 6–23)
CO2: 32 meq/L (ref 19–32)
Calcium: 9.4 mg/dL (ref 8.4–10.5)
Chloride: 101 meq/L (ref 96–112)
Creatinine, Ser: 1.17 mg/dL (ref 0.40–1.50)
GFR: 60.41 mL/min (ref >60.00)
Glucose, Bld: 90 mg/dL (ref 70–99)
Potassium: 5 meq/L (ref 3.5–5.1)
Sodium: 139 meq/L (ref 135–145)

## 2019-08-16 LAB — HEPATIC FUNCTION PANEL
ALT: 15 U/L (ref 0–53)
AST: 17 U/L (ref 0–37)
Albumin: 4.4 g/dL (ref 3.5–5.2)
Alkaline Phosphatase: 62 U/L (ref 39–117)
Bilirubin, Direct: 0.2 mg/dL (ref 0.0–0.3)
Total Bilirubin: 0.7 mg/dL (ref 0.2–1.2)
Total Protein: 7.3 g/dL (ref 6.0–8.3)

## 2019-08-16 MED ORDER — PREDNISONE 10 MG PO TABS: ORAL_TABLET | ORAL | 0 refills | Status: DC

## 2019-08-16 MED ORDER — HYDROCODONE-ACETAMINOPHEN 10-325 MG PO TABS: 1.0000 | ORAL_TABLET | Freq: Four times a day (QID) | ORAL | 0 refills | Status: DC | PRN

## 2019-08-16 NOTE — Progress Notes (Signed)
Subjective:     Patient ID: Rodney Barnett, male   DOB: 1941/09/01, 78 y.o.   MRN: [id number]  HPI Rodney Barnett seen for several issues as follows  He has some chronic back pain and has been on low-dose hydrocodone for several years.  Takes 1/2 tablet twice daily and generally his back pain has been fairly well controlled with that. He is due for repeat drug screen at this time.  Denies any side effects such as constipation.  Recent left wrist pain over 2 months duration.  Denies any injury.  He went to urgent care reported had x-rays which were unremarkable.  Was placed on ibuprofen which has not helped.  Pain with extension of the thumb.  No hand pain.  He tried thumb spica splint without improvement.  Other issue is recent progressive left sciatica symptoms.  Intermittent sharp pain radiating into the left lower extremity.  Denies any numbness or weakness.  No urine or stool incontinence.  No relief with ibuprofen.  He takes gabapentin 300 mg 3 times daily at baseline  He does have bladder cancer and is getting regular cystoscopies through urology with no signs of recurrence.  No recent gross hematuria.  Appetite and weight stable.  He has history of CAD and dyslipidemia.  Is due for repeat labs including lipids.  He remains on Plavix, aspirin, and Crestor.  Also takes metoprolol and lisinopril as well as Imdur 30 mg.  No recent chest pains  Past Medical History:  Diagnosis Date  . Chronic back pain   . Chronic rhinitis   . Coronary atherosclerosis of native coronary artery    Multivessel, LVEF 55-60%, basal inferior hypokinesis, known graft disease  . Erectile dysfunction   . Essential hypertension   . Insomnia   . Lipoma   . Mixed hyperlipidemia   . Peripheral neuropathy    Past Surgical History:  Procedure Laterality Date  . CORONARY ARTERY BYPASS GRAFT  1994   LIMA to LAD, SVG to RCA, SVG to diagonal  . TONSILLECTOMY    . TRANSURETHRAL RESECTION OF BLADDER TUMOR N/A 09/09/2018   Procedure: TRANSURETHRAL RESECTION OF BLADDER TUMOR (TURBT) WITH INSTILLATION OF POST OPERATIVE CHEMOTHERAPY;  Surgeon: Kathie Rhodes, MD;  Location: [facility];  Service: Urology;  Laterality: N/A;    reports that he quit smoking about 26 years ago. His smoking use included cigarettes. He started smoking about 64 years ago. He has a 120.00 pack-year smoking history. He has never used smokeless tobacco. He reports that he does not drink alcohol or use drugs. family history includes Cancer in his brother; Hypertension in an other family member. Allergies  Allergen Reactions  . Lipitor [Atorvastatin] Other (See Comments)    myalgia     Review of Systems  Constitutional: Negative for activity change, appetite change, chills and fever.  Respiratory: Negative for cough and shortness of breath.   Cardiovascular: Negative for chest pain and leg swelling.  Gastrointestinal: Negative for abdominal pain and vomiting.  Genitourinary: Negative for dysuria, flank pain and hematuria.  Musculoskeletal: Positive for back pain. Negative for joint swelling.  Neurological: Negative for weakness and numbness.  Psychiatric/Behavioral: Negative for dysphoric mood.       Objective:   Physical Exam Vitals signs reviewed.  Constitutional:      Appearance: Normal appearance.  Cardiovascular:     Rate and Rhythm: Normal rate and regular rhythm.  Pulmonary:     Effort: Pulmonary effort is normal.     Breath sounds: Normal breath sounds.  Musculoskeletal:     Right lower leg: No edema.     Left lower leg: No edema.     Comments: Straight leg raise are negative bilaterally.  Left wrist reveals no evidence for joint effusion.  He has some tenderness involving extensor tendon of the left thumb.  No bony tenderness.  No warmth.  No erythema.  Neurological:     Mental Status: He is alert.     Comments: 2+ reflexes ankle and knee bilaterally.  Full strength throughout with plantarflexion, dorsiflexion, and knee  extension        Assessment:     #1 history of CAD/hyperlipidemia.  Goal LDL less than 70  #2 chronic back pain controlled with low-dose hydrocodone.  Drug registry reviewed with no concerns.  Due for repeat drug screen He is currently describing some sciatica type symptoms but nonfocal neuro exam  #3 left wrist pain.  Suspect some tendinitis.    Plan:     -He declines flu vaccine -Prednisone taper over the next several days and be in touch if sciatica symptoms not improved with that. -Consider cortisone injection left wrist if not improving with prednisone -Refilled his hydrocodone for 3 months -Encouraged to continue close follow-up with urology regarding his bladder cancer -Check labs with basic metabolic panel, lipid panel, hepatic panel  [name] MD [facility] at [facility]

## 2019-08-16 NOTE — Patient Instructions (Addendum)

## 2019-08-18 LAB — PAIN MGMT, PROFILE 8 W/CONF, U
6 Acetylmorphine: NEGATIVE ng/mL
Alcohol Metabolites: NEGATIVE ng/mL (ref <500)
Amphetamines: NEGATIVE ng/mL
Benzodiazepines: NEGATIVE ng/mL
Buprenorphine, Urine: NEGATIVE ng/mL
Cocaine Metabolite: NEGATIVE ng/mL
Codeine: NEGATIVE ng/mL
Creatinine: 31.7 mg/dL
Hydrocodone: 320 ng/mL
Hydromorphone: NEGATIVE ng/mL
MDMA: NEGATIVE ng/mL
Marijuana Metabolite: NEGATIVE ng/mL
Morphine: NEGATIVE ng/mL
Norhydrocodone: 369 ng/mL
Opiates: POSITIVE ng/mL
Oxidant: NEGATIVE ug/mL
Oxycodone: NEGATIVE ng/mL
pH: 5.5 (ref 4.5–9.0)

## 2019-08-24 ENCOUNTER — Other Ambulatory Visit: Payer: Self-pay | Admitting: Family Medicine

## 2019-09-04 ENCOUNTER — Other Ambulatory Visit: Payer: Self-pay | Admitting: Family Medicine

## 2019-09-22 ENCOUNTER — Other Ambulatory Visit: Payer: Self-pay | Admitting: Family Medicine

## 2019-10-03 ENCOUNTER — Other Ambulatory Visit: Payer: Self-pay | Admitting: Family Medicine

## 2019-11-01 ENCOUNTER — Other Ambulatory Visit: Payer: Self-pay | Admitting: Family Medicine

## 2019-11-17 ENCOUNTER — Other Ambulatory Visit: Payer: Self-pay

## 2019-11-17 ENCOUNTER — Encounter: Payer: Self-pay | Admitting: Family Medicine

## 2019-11-17 ENCOUNTER — Ambulatory Visit (INDEPENDENT_AMBULATORY_CARE_PROVIDER_SITE_OTHER): Payer: Medicare Other

## 2019-11-17 ENCOUNTER — Ambulatory Visit (INDEPENDENT_AMBULATORY_CARE_PROVIDER_SITE_OTHER): Payer: Medicare Other | Admitting: Family Medicine

## 2019-11-17 VITALS — BP 124/80 | HR 51 | Temp 96.5°F | Ht 76.0 in | Wt 260.9 lb

## 2019-11-17 DIAGNOSIS — M5442 Lumbago with sciatica, left side: Secondary | ICD-10-CM | POA: Diagnosis not present

## 2019-11-17 DIAGNOSIS — I2510 Atherosclerotic heart disease of native coronary artery without angina pectoris: Secondary | ICD-10-CM

## 2019-11-17 DIAGNOSIS — M5416 Radiculopathy, lumbar region: Secondary | ICD-10-CM

## 2019-11-17 DIAGNOSIS — I10 Essential (primary) hypertension: Secondary | ICD-10-CM

## 2019-11-17 DIAGNOSIS — G8929 Other chronic pain: Secondary | ICD-10-CM

## 2019-11-17 MED ORDER — HYDROCODONE-ACETAMINOPHEN 10-325 MG PO TABS: 1.0000 | ORAL_TABLET | Freq: Four times a day (QID) | ORAL | 0 refills | Status: DC | PRN

## 2019-11-17 MED ORDER — NITROGLYCERIN 0.4 MG SL SUBL: 0.4000 mg | SUBLINGUAL_TABLET | SUBLINGUAL | 3 refills | Status: DC | PRN

## 2019-11-17 MED ORDER — PREDNISONE 10 MG PO TABS: ORAL_TABLET | ORAL | 0 refills | Status: DC

## 2019-11-17 NOTE — Patient Instructions (Signed)
Radicular Pain Radicular pain is a type of pain that spreads from your back or neck along a spinal nerve. Spinal nerves are nerves that leave the spinal cord and go to the muscles. Radicular pain is sometimes called radiculopathy, radiculitis, or a pinched nerve. When you have this type of pain, you may also have weakness, numbness, or tingling in the area of your body that is supplied by the nerve. The pain may feel sharp and burning. Depending on which spinal nerve is affected, the pain may occur in the:  Neck area (cervical radicular pain). You may also feel pain, numbness, weakness, or tingling in the arms.  Mid-spine area (thoracic radicular pain). You would feel this pain in the back and chest. This type is rare.  Lower back area (lumbar radicular pain). You would feel this pain as low back pain. You may feel pain, numbness, weakness, or tingling in the buttocks or legs. Sciatica is a type of lumbar radicular pain that shoots down the back of the leg. Radicular pain occurs when one of the spinal nerves becomes irritated or squeezed (compressed). It is often caused by something pushing on a spinal nerve, such as one of the bones of the spine (vertebrae) or one of the round cushions between vertebrae (intervertebral disks). This can result from:  An injury.  Wear and tear or aging of a disk.  The growth of a bone spur that pushes on the nerve. Radicular pain often goes away when you follow instructions from your health care provider for relieving pain at home. Follow these instructions at home: Managing pain      If directed, put ice on the affected area: ? Put ice in a plastic bag. ? Place a towel between your skin and the bag. ? Leave the ice on for 20 minutes, 2-3 times a day.  If directed, apply heat to the affected area as often as told by your health care provider. Use the heat source that your health care provider recommends, such as a moist heat pack or a heating pad. ? Place  a towel between your skin and the heat source. ? Leave the heat on for 20-30 minutes. ? Remove the heat if your skin turns bright red. This is especially important if you are unable to feel pain, heat, or cold. You may have a greater risk of getting burned. Activity   Do not sit or rest in bed for long periods of time.  Try to stay as active as possible. Ask your health care provider what type of exercise or activity is best for you.  Avoid activities that make your pain worse, such as bending and lifting.  Do not lift anything that is heavier than 10 lb (4.5 kg), or the limit that you are told, until your health care provider says that it is safe.  Practice using proper technique when lifting items. Proper lifting technique involves bending your knees and rising up.  Do strength and range-of-motion exercises only as told by your health care provider or physical therapist. General instructions  Take over-the-counter and prescription medicines only as told by your health care provider.  Pay attention to any changes in your symptoms.  Keep all follow-up visits as told by your health care provider. This is important. ? Your health care provider may send you to a physical therapist to help with this pain. Contact a health care provider if:  Your pain and other symptoms get worse.  Your pain medicine is not   helping.  Your pain has not improved after a few weeks of home care.  You have a fever. Get help right away if:  You have severe pain, weakness, or numbness.  You have difficulty with bladder or bowel control. Summary  Radicular pain is a type of pain that spreads from your back or neck along a spinal nerve.  When you have radicular pain, you may also have weakness, numbness, or tingling in the area of your body that is supplied by the nerve.  The pain may feel sharp or burning.  Radicular pain may be treated with ice, heat, medicines, or physical therapy. This  information is not intended to replace advice given to you by your health care provider. Make sure you discuss any questions you have with your health care provider. Document Revised: 04/05/2018 Document Reviewed: 04/05/2018 Elsevier Patient Education  2020 [employer].  

## 2019-11-17 NOTE — Progress Notes (Signed)
Subjective:     Patient ID: Rodney Barnett, male   DOB: [birth year], 79 y.o.   MRN: [id number]  HPI Payden is here for medical follow-up.  He has chronic back pain and takes one half Norco 10 mg twice daily and has been on this regimen for several years and seems to control his pain fairly well.  He has history of CAD and is not a good candidate for nonsteroidals.  He recently had some progressive back pain with radiation down the left lower extremity all the way to the foot.  This is been progressive over several months.  He denies any weakness.  No urine or stool incontinence.  His pain is worse with walking.  Pain is severe at times.  He states he had what sounds like epidural injections years ago and they did help at that point.  He has not had any recent x-rays.  No appetite or weight changes.  No fever.  No urine or stool incontinence  He has had some intermittent left wrist pain.  We suspected tendinitis several months ago and he took one course of prednisone which seemed to help.  He has pain sometimes with gripping.  Chronic problems include CAD, hypertension, history of bladder cancer, hyperlipidemia.  He has not seen cardiologist in person in almost 2 years.  Denies any recent chest pains.  He had labs back in November and these were reviewed and stable.  Has bladder cancer follow-up has decreased every 6 months at this time  Past Medical History:  Diagnosis Date  . Chronic back pain   . Chronic rhinitis   . Coronary atherosclerosis of native coronary artery    Multivessel, LVEF 55-60%, basal inferior hypokinesis, known graft disease  . Erectile dysfunction   . Essential hypertension   . Insomnia   . Lipoma   . Mixed hyperlipidemia   . Peripheral neuropathy    Past Surgical History:  Procedure Laterality Date  . CORONARY ARTERY BYPASS GRAFT  1994   LIMA to LAD, SVG to RCA, SVG to diagonal  . TONSILLECTOMY    . TRANSURETHRAL RESECTION OF BLADDER TUMOR N/A 09/09/2018   Procedure:  TRANSURETHRAL RESECTION OF BLADDER TUMOR (TURBT) WITH INSTILLATION OF POST OPERATIVE CHEMOTHERAPY;  Surgeon: Kathie Rhodes, MD;  Location: [facility];  Service: Urology;  Laterality: N/A;    reports that he quit smoking about 26 years ago. His smoking use included cigarettes. He started smoking about 65 years ago. He has a 120.00 pack-year smoking history. He has never used smokeless tobacco. He reports that he does not drink alcohol or use drugs. family history includes Cancer in his brother; Hypertension in an other family member. Allergies  Allergen Reactions  . Lipitor [Atorvastatin] Other (See Comments)    myalgia    Review of Systems  Constitutional: Negative for chills, fatigue, fever and unexpected weight change.  HENT: Negative for dental problem.   Eyes: Negative for visual disturbance.  Respiratory: Negative for cough, chest tightness and shortness of breath.   Cardiovascular: Negative for chest pain, palpitations and leg swelling.  Endocrine: Negative for polydipsia and polyuria.  Genitourinary: Negative for dysuria.  Musculoskeletal: Positive for back pain.  Neurological: Negative for dizziness, syncope, weakness, light-headedness and headaches.        Objective:   Physical Exam Vitals reviewed.  Constitutional:      Appearance: Normal appearance.  Cardiovascular:     Rate and Rhythm: Normal rate and regular rhythm.  Pulmonary:     Effort: Pulmonary  effort is normal.     Breath sounds: Normal breath sounds.  Musculoskeletal:     Right lower leg: No edema.     Left lower leg: No edema.     Comments: Straight leg raise negative  Neurological:     Mental Status: He is alert.     Comments: Deep tendon reflexes are 2+ knee and 1+ ankle bilaterally.  He has full strength with plantarflexion, dorsiflexion, and knee extension bilaterally.  Normal sensory function to touch.        Assessment:     #1 chronic low back pain.  Has had some recent acute on chronic issues  with left lumbar radiculitis symptoms which are new but it been progressive for months.  He does not have any weakness but is having progressive pain  #2 hypertension stable and at goal  #3 history of CAD.  Symptomatically stable.  Requesting refills of nitroglycerin sublingual which he has not taken in years    Plan:     -Refill nitroglycerin 0.4 mg sublingually as needed for chest pain  -Refill prednisone taper to see if this helps with his acute back issue  -23-month refill of hydrocodone given number 90 tablets and recommend 5-month follow-up.  [facility] drug registry reviewed with no red flags.  -Start with plain lumbar films of the back.  Suspect will need MRI to help further clarify whether he is a candidate for any kind of epidurals  [name] MD [facility] at [facility]

## 2019-11-20 ENCOUNTER — Telehealth: Payer: Self-pay | Admitting: Family Medicine

## 2019-11-20 NOTE — Telephone Encounter (Signed)
Pt is returning Friday's call. Thanks

## 2019-11-21 NOTE — Telephone Encounter (Signed)
I did not call this patient on Friday and I do not see a TE for anyone else who may have called.

## 2019-11-24 ENCOUNTER — Other Ambulatory Visit: Payer: Self-pay

## 2019-11-24 DIAGNOSIS — M545 Low back pain, unspecified: Secondary | ICD-10-CM

## 2019-11-24 DIAGNOSIS — G8929 Other chronic pain: Secondary | ICD-10-CM

## 2019-11-24 DIAGNOSIS — M5416 Radiculopathy, lumbar region: Secondary | ICD-10-CM

## 2019-11-29 ENCOUNTER — Other Ambulatory Visit: Payer: Self-pay | Admitting: Family Medicine

## 2019-12-26 DIAGNOSIS — M48061 Spinal stenosis, lumbar region without neurogenic claudication: Secondary | ICD-10-CM | POA: Insufficient documentation

## 2019-12-26 DIAGNOSIS — D6869 Other thrombophilia: Secondary | ICD-10-CM | POA: Insufficient documentation

## 2019-12-27 ENCOUNTER — Other Ambulatory Visit: Payer: Self-pay | Admitting: Family Medicine

## 2019-12-27 NOTE — Telephone Encounter (Signed)
Message routed

## 2019-12-27 NOTE — Telephone Encounter (Signed)
This has been sent in  

## 2019-12-27 NOTE — Telephone Encounter (Signed)
Message routed to PCP CMA  

## 2019-12-27 NOTE — Telephone Encounter (Signed)
May refill with 5 additional refills   this is non-controlled.

## 2019-12-27 NOTE — Telephone Encounter (Signed)
Last ov:11/17/19 Last filled:11/29/19

## 2020-01-24 ENCOUNTER — Other Ambulatory Visit: Payer: Self-pay | Admitting: Cardiology

## 2020-02-16 ENCOUNTER — Encounter: Payer: Self-pay | Admitting: Family Medicine

## 2020-02-16 ENCOUNTER — Ambulatory Visit (INDEPENDENT_AMBULATORY_CARE_PROVIDER_SITE_OTHER): Payer: Medicare Other | Admitting: Family Medicine

## 2020-02-16 ENCOUNTER — Other Ambulatory Visit: Payer: Self-pay

## 2020-02-16 VITALS — BP 110/62 | HR 67 | Temp 97.6°F | Wt 265.4 lb

## 2020-02-16 DIAGNOSIS — I10 Essential (primary) hypertension: Secondary | ICD-10-CM

## 2020-02-16 DIAGNOSIS — G8929 Other chronic pain: Secondary | ICD-10-CM

## 2020-02-16 DIAGNOSIS — M5442 Lumbago with sciatica, left side: Secondary | ICD-10-CM

## 2020-02-16 MED ORDER — HYDROCODONE-ACETAMINOPHEN 10-325 MG PO TABS: 1.0000 | ORAL_TABLET | Freq: Four times a day (QID) | ORAL | 0 refills | Status: DC | PRN

## 2020-02-16 NOTE — Progress Notes (Signed)
  Subjective:     Patient ID: Rodney Barnett, male   DOB: 1940/12/16, 79 y.o.   MRN: [id number]  HPI Jaquavis has history of CAD, hypertension, peripheral neuropathy, bladder cancer, chronic back pain, hyperlipidemia.  He had chronic back pain for several years.  Takes Norco 10 mg 1/2 tablet twice daily.  He does have some breakthrough pain with arthritis in other joints but in terms of his back pain this is fairly well controlled with low-dose hydrocodone.  Denies any side effects.  He had drug screen back in the fall which did come back negative but he had run out of his medication for several days prior to that screen.  We have had no concerns regarding misuse.  He has not yet decided to get Covid vaccine  Past Medical History:  Diagnosis Date  . Chronic back pain   . Chronic rhinitis   . Coronary atherosclerosis of native coronary artery    Multivessel, LVEF 55-60%, basal inferior hypokinesis, known graft disease  . Erectile dysfunction   . Essential hypertension   . Insomnia   . Lipoma   . Mixed hyperlipidemia   . Peripheral neuropathy    Past Surgical History:  Procedure Laterality Date  . CORONARY ARTERY BYPASS GRAFT  1994   LIMA to LAD, SVG to RCA, SVG to diagonal  . TONSILLECTOMY    . TRANSURETHRAL RESECTION OF BLADDER TUMOR N/A 09/09/2018   Procedure: TRANSURETHRAL RESECTION OF BLADDER TUMOR (TURBT) WITH INSTILLATION OF POST OPERATIVE CHEMOTHERAPY;  Surgeon: Kathie Rhodes, MD;  Location: [facility];  Service: Urology;  Laterality: N/A;    reports that he quit smoking about 27 years ago. His smoking use included cigarettes. He started smoking about 65 years ago. He has a 120.00 pack-year smoking history. He has never used smokeless tobacco. He reports that he does not drink alcohol or use drugs. family history includes Cancer in his brother; Hypertension in an other family member. Allergies  Allergen Reactions  . Lipitor [Atorvastatin] Other (See Comments)    myalgia     Review of  Systems  Respiratory: Negative for shortness of breath.   Cardiovascular: Negative for chest pain.  Musculoskeletal: Positive for arthralgias and back pain.       Objective:   Physical Exam Vitals reviewed.  Constitutional:      Appearance: Normal appearance.  Cardiovascular:     Rate and Rhythm: Normal rate and regular rhythm.  Pulmonary:     Effort: Pulmonary effort is normal.     Breath sounds: Normal breath sounds.  Musculoskeletal:     Right lower leg: No edema.     Left lower leg: No edema.  Neurological:     Mental Status: He is alert.        Assessment:     Chronic low back pain stable    Plan:     -Refill hydrocodone for 3 months. -[facility] drug registry reviewed with no concerns -Recommend routine follow-up in 3 months  [name] MD [facility] at [facility]

## 2020-02-20 ENCOUNTER — Other Ambulatory Visit: Payer: Self-pay | Admitting: Family Medicine

## 2020-02-20 ENCOUNTER — Other Ambulatory Visit: Payer: Self-pay | Admitting: Cardiology

## 2020-02-26 NOTE — Progress Notes (Signed)
Cardiology Office Note  Date: 02/26/2020   ID: Rodney, Barnett 07/13/1941, MRN [id number]  PCP:  [name], MD  Cardiologist:  Rozann Lesches, MD Electrophysiologist:  None   Chief Complaint: Follow-up CAD, HTN, HLD  History of Present Illness: Rodney Barnett is a 79 y.o. male with a history of CAD (status post CABG with known graft disease), HTN, HLD, history of tobacco abuse in remission.  Last seen by Dr. Domenic Polite on 02/08/2019 via telemedicine.  He did not report any significant anginal symptoms or nitroglycerin use.  Had NYHA class II with typical activities.  He was continuing antiplatelet regimen along with ACEI, beta-blocker, statin therapy, and long-acting nitrate.  Blood pressure was well controlled.  He continued on Crestor and last LDL was 44.  Patient denies any recent issues.  States he has not received the Covid vaccine.  He denies any recent acute illnesses, hospitalizations, surgeries, or tick bites, or travels.  States he mostly stays at home and does not get out much.  He denies any anginal or exertional symptoms, palpitations or arrhythmias, orthostatic symptoms, CVA or TIA-like symptoms, bleeding issues, claudication symptoms.  He does have some back issues with some radicular pain which limits his activity.  He does have some mild lower extremity edema.  Past Medical History:  Diagnosis Date  . Chronic back pain   . Chronic rhinitis   . Coronary atherosclerosis of native coronary artery    Multivessel, LVEF 55-60%, basal inferior hypokinesis, known graft disease  . Erectile dysfunction   . Essential hypertension   . Insomnia   . Lipoma   . Mixed hyperlipidemia   . Peripheral neuropathy     Past Surgical History:  Procedure Laterality Date  . CORONARY ARTERY BYPASS GRAFT  1994   LIMA to LAD, SVG to RCA, SVG to diagonal  . TONSILLECTOMY    . TRANSURETHRAL RESECTION OF BLADDER TUMOR N/A 09/09/2018   Procedure: TRANSURETHRAL RESECTION OF BLADDER  TUMOR (TURBT) WITH INSTILLATION OF POST OPERATIVE CHEMOTHERAPY;  Surgeon: Kathie Rhodes, MD;  Location: [facility];  Service: Urology;  Laterality: N/A;    Current Outpatient Medications  Medication Sig Dispense Refill  . aspirin 81 MG tablet Take 81 mg by mouth daily.      . clopidogrel (PLAVIX) 75 MG tablet TAKE 1 TABLET DAILY. 90 tablet 3  . gabapentin (NEURONTIN) 300 MG capsule TAKE (2) CAPSULES BY MOUTH THREE TIMES DAILY. 180 capsule 5  . HYDROcodone-acetaminophen (NORCO) 10-325 MG tablet Take 1 tablet by mouth every 6 (six) hours as needed. 90 tablet 0  . isosorbide mononitrate (IMDUR) 30 MG 24 hr tablet TAKE 1 TABLET BY MOUTH ONCE DAILY. 30 tablet 6  . lisinopril (ZESTRIL) 10 MG tablet TAKE 1 TABLET ONCE DAILY. 90 tablet 0  . metoprolol tartrate (LOPRESSOR) 25 MG tablet TAKE (1) TABLET TWICE DAILY. 180 tablet 1  . nitroGLYCERIN (NITROSTAT) 0.4 MG SL tablet Place 1 tablet (0.4 mg total) under the tongue every 5 (five) minutes x 3 doses as needed for chest pain (If no relief after 3rd dose, proceed to the ED for an evaluation or call 911). 25 tablet 3  . rosuvastatin (CRESTOR) 20 MG tablet TAKE 1 TABLET BY MOUTH ONCE DAILY. 30 tablet 5   No current facility-administered medications for this visit.   Allergies:  Lipitor [atorvastatin]   Social History: The patient  reports that he quit smoking about 27 years ago. His smoking use included cigarettes. He started smoking about 65  years ago. He has a 120.00 pack-year smoking history. He has never used smokeless tobacco. He reports that he does not drink alcohol or use drugs.   Family History: The patient's family history includes Cancer in his brother; Hypertension in an other family member.   ROS:  Please see the history of present illness. Otherwise, complete review of systems is positive for none.  All other systems are reviewed and negative.   Physical Exam: VS:  There were no vitals taken for this visit., BMI There is no height or weight  on file to calculate BMI.  Wt Readings from Last 3 Encounters:  02/16/20 265 lb 6.4 oz (120.4 kg)  11/17/19 260 lb 14.4 oz (118.3 kg)  08/16/19 266 lb 4.8 oz (120.8 kg)    General: Patient appears comfortable at rest. Neck: Supple, no elevated JVP or carotid bruits, no thyromegaly. Lungs: Clear to auscultation, nonlabored breathing at rest. Cardiac: Regular rate and rhythm, no S3 or significant systolic murmur, no pericardial rub. Extremities: Mild non-pitting edema bilaterally, distal pulses 2+. Skin: Warm and dry. Musculoskeletal: No kyphosis. Neuropsychiatric: Alert and oriented x3, affect grossly appropriate.  ECG:  An ECG dated 02/27/2020 was personally reviewed today and demonstrated:  Marked sinus bradycardia with a rate of 48.  Recent Labwork: 08/16/2019: ALT 15; AST 17; BUN 15; Creatinine, Ser 1.17; Potassium 5.0; Sodium 139     Component Value Date/Time   CHOL 133 08/16/2019 1206   TRIG 116.0 08/16/2019 1206   HDL 41.70 08/16/2019 1206   CHOLHDL 3 08/16/2019 1206   VLDL 23.2 08/16/2019 1206   LDLCALC 68 08/16/2019 1206   LDLDIRECT 114.8 06/07/2012 [id number]    Other Studies Reviewed Today:   Assessment and Plan:  1. CAD in native artery   2. Essential hypertension   3. Mixed hyperlipidemia    1. CAD in native artery Denies any recent anginal or exertional symptoms.  Continue aspirin 81 mg, Plavix 75 mg, Imdur 30 mg daily.  States he has been n she has had no significant issues since his bypass surgery  2. Essential hypertension He is normotensive on current medical therapy.  Blood pressure today is 128/64, heart rate 54 on arrival.  Continue lisinopril 10 mg, metoprolol 25 mg p.o. twice daily.  3. Mixed hyperlipidemia Last lipid profile 08/16/2019 with total cholesterol 133, triglycerides 116, HDL 41, LDL 68.  Continue Crestor 20 mg daily.   Medication Adjustments/Labs and Tests Ordered: Current medicines are reviewed at length with the patient today.  Concerns  regarding medicines are outlined above.   Disposition: Follow-up with Dr. Domenic Polite or APP 1 year.  Signed, Levell July, NP 02/26/2020 3:52 PM    [facility] [place], [place], [place] [address] Phone: [phone]; Fax: [phone]

## 2020-02-27 ENCOUNTER — Ambulatory Visit (INDEPENDENT_AMBULATORY_CARE_PROVIDER_SITE_OTHER): Payer: Medicare Other | Admitting: Family Medicine

## 2020-02-27 ENCOUNTER — Other Ambulatory Visit: Payer: Self-pay

## 2020-02-27 ENCOUNTER — Encounter: Payer: Self-pay | Admitting: Family Medicine

## 2020-02-27 VITALS — BP 128/64 | HR 54 | Ht 76.0 in | Wt 262.4 lb

## 2020-02-27 DIAGNOSIS — E782 Mixed hyperlipidemia: Secondary | ICD-10-CM

## 2020-02-27 DIAGNOSIS — I10 Essential (primary) hypertension: Secondary | ICD-10-CM | POA: Diagnosis not present

## 2020-02-27 DIAGNOSIS — I2510 Atherosclerotic heart disease of native coronary artery without angina pectoris: Secondary | ICD-10-CM | POA: Diagnosis not present

## 2020-02-27 NOTE — Patient Instructions (Addendum)

## 2020-04-20 ENCOUNTER — Other Ambulatory Visit: Payer: Self-pay | Admitting: Family Medicine

## 2020-04-22 ENCOUNTER — Telehealth: Payer: Self-pay | Admitting: Family Medicine

## 2020-04-22 NOTE — Telephone Encounter (Signed)
NO VM SETUP. Pt due to schedule Medicare Annual Wellness Visit (AWV) with Nurse Health Advisor   This should be a 62 MINUTE VISIT.  No past hx of AWV

## 2020-05-20 ENCOUNTER — Encounter: Payer: Self-pay | Admitting: Family Medicine

## 2020-05-20 ENCOUNTER — Other Ambulatory Visit: Payer: Self-pay

## 2020-05-20 ENCOUNTER — Ambulatory Visit (INDEPENDENT_AMBULATORY_CARE_PROVIDER_SITE_OTHER): Payer: Medicare Other | Admitting: Family Medicine

## 2020-05-20 VITALS — BP 110/52 | HR 64 | Temp 98.0°F | Ht 76.0 in | Wt 260.0 lb

## 2020-05-20 DIAGNOSIS — G8929 Other chronic pain: Secondary | ICD-10-CM

## 2020-05-20 DIAGNOSIS — M5442 Lumbago with sciatica, left side: Secondary | ICD-10-CM

## 2020-05-20 MED ORDER — HYDROCODONE-ACETAMINOPHEN 10-325 MG PO TABS: 1.0000 | ORAL_TABLET | Freq: Four times a day (QID) | ORAL | 0 refills | Status: DC | PRN

## 2020-05-20 NOTE — Progress Notes (Signed)
Established Patient Office Visit  Subjective:  Patient ID: Rodney Barnett, male    DOB: [birth year]  Age: 79 y.o. MRN: [id number]  CC:  Chief Complaint  Patient presents with  . Medication Management    pain management     HPI Rodney Barnett presents for follow-up regarding chronic low back pain.  His other medical problems include history of CAD, hypertension, peripheral neuropathy, bladder cancer, hyperlipidemia.  Denies any recent chest pains.  Medications reviewed.  Compliant with all.  He remains on hydrocodone 10 mg takes 1/2 tablet twice daily and that seems to be helping him through the day with his pain.  He tried over-the-counter medication without relief.  No history of misuse with medication  Indication for chronic opioid: Chronic low back pain Medication and dose: Hydrocodone 10 mg 1/2 tablet twice daily # pills per month: 30 Last UDS date: 11/20 Opioid Treatment Agreement signed (Y/N): yes Opioid Treatment Agreement last reviewed with patient:  today [facility] reviewed this encounter (include red flags):  Reviewed with no red flags   Past Medical History:  Diagnosis Date  . Chronic back pain   . Chronic rhinitis   . Coronary atherosclerosis of native coronary artery    Multivessel, LVEF 55-60%, basal inferior hypokinesis, known graft disease  . Erectile dysfunction   . Essential hypertension   . Insomnia   . Lipoma   . Mixed hyperlipidemia   . Peripheral neuropathy     Past Surgical History:  Procedure Laterality Date  . CORONARY ARTERY BYPASS GRAFT  1994   LIMA to LAD, SVG to RCA, SVG to diagonal  . TONSILLECTOMY    . TRANSURETHRAL RESECTION OF BLADDER TUMOR N/A 09/09/2018   Procedure: TRANSURETHRAL RESECTION OF BLADDER TUMOR (TURBT) WITH INSTILLATION OF POST OPERATIVE CHEMOTHERAPY;  Surgeon: Kathie Rhodes, MD;  Location: [facility];  Service: Urology;  Laterality: N/A;    Family History  Problem Relation Age of Onset  . Hypertension Other   . Cancer Brother         renal cancer    Social History   Socioeconomic History  . Marital status: Widowed    Spouse name: Not on file  . Number of children: Not on file  . Years of education: Not on file  . Highest education level: Not on file  Occupational History  . Not on file  Tobacco Use  . Smoking status: Former Smoker    Packs/day: 3.00    Years: 40.00    Pack years: 120.00    Types: Cigarettes    Start date: 10/05/1954    Quit date: 01/06/1993    Years since quitting: 27.3  . Smokeless tobacco: Never Used  Vaping Use  . Vaping Use: Never used  Substance and Sexual Activity  . Alcohol use: No    Alcohol/week: 0.0 standard drinks  . Drug use: No  . Sexual activity: Not on file  Other Topics Concern  . Not on file  Social History Narrative  . Not on file   Social Determinants of Health   Financial Resource Strain:   . Difficulty of Paying Living Expenses:   Food Insecurity:   . Worried About Charity fundraiser in the Last Year:   . Arboriculturist in the Last Year:   Transportation Needs:   . Film/video editor (Medical):   Marland Kitchen Lack of Transportation (Non-Medical):   Physical Activity:   . Days of Exercise per Week:   . Minutes of Exercise  per Session:   Stress:   . Feeling of Stress :   Social Connections:   . Frequency of Communication with Friends and Family:   . Frequency of Social Gatherings with Friends and Family:   . Attends Religious Services:   . Active Member of Clubs or Organizations:   . Attends Archivist Meetings:   Marland Kitchen Marital Status:   Intimate Partner Violence:   . Fear of Current or Ex-Partner:   . Emotionally Abused:   Marland Kitchen Physically Abused:   . Sexually Abused:     Outpatient Medications Prior to Visit  Medication Sig Dispense Refill  . aspirin 81 MG tablet Take 81 mg by mouth daily.      . clopidogrel (PLAVIX) 75 MG tablet TAKE 1 TABLET DAILY. 90 tablet 3  . gabapentin (NEURONTIN) 300 MG capsule TAKE (2) CAPSULES BY MOUTH THREE TIMES  DAILY. 180 capsule 5  . isosorbide mononitrate (IMDUR) 30 MG 24 hr tablet TAKE 1 TABLET BY MOUTH ONCE DAILY. 30 tablet 6  . lisinopril (ZESTRIL) 10 MG tablet TAKE 1 TABLET ONCE DAILY. 90 tablet 0  . metoprolol tartrate (LOPRESSOR) 25 MG tablet TAKE (1) TABLET TWICE DAILY. 60 tablet 0  . nitroGLYCERIN (NITROSTAT) 0.4 MG SL tablet Place 1 tablet (0.4 mg total) under the tongue every 5 (five) minutes x 3 doses as needed for chest pain (If no relief after 3rd dose, proceed to the ED for an evaluation or call 911). 25 tablet 3  . rosuvastatin (CRESTOR) 20 MG tablet TAKE 1 TABLET BY MOUTH ONCE DAILY. 30 tablet 5  . HYDROcodone-acetaminophen (NORCO) 10-325 MG tablet Take 1 tablet by mouth every 6 (six) hours as needed. 90 tablet 0   No facility-administered medications prior to visit.    Allergies  Allergen Reactions  . Lipitor [Atorvastatin] Other (See Comments)    myalgia    ROS Review of Systems  Constitutional: Negative for appetite change, chills, fever and unexpected weight change.  Respiratory: Negative for shortness of breath.   Cardiovascular: Negative for chest pain.  Gastrointestinal: Negative for abdominal pain.  Musculoskeletal: Positive for back pain.      Objective:    Physical Exam Vitals reviewed.  Constitutional:      Appearance: Normal appearance.  Cardiovascular:     Rate and Rhythm: Normal rate and regular rhythm.  Pulmonary:     Effort: Pulmonary effort is normal.     Breath sounds: Normal breath sounds.  Musculoskeletal:     Right lower leg: No edema.     Left lower leg: No edema.  Neurological:     Mental Status: He is alert.     BP (!) 110/52   Pulse 64   Temp 98 F (36.7 C) (Oral)   Ht 6\' 4"  (1.93 m)   Wt 260 lb (117.9 kg)   SpO2 96%   BMI 31.65 kg/m  Wt Readings from Last 3 Encounters:  05/20/20 260 lb (117.9 kg)  02/27/20 262 lb 6.4 oz (119 kg)  02/16/20 265 lb 6.4 oz (120.4 kg)     Health Maintenance Due  Topic Date Due  .  Hepatitis C Screening  Never done  . COVID-19 Vaccine (1) Never done  . TETANUS/TDAP  Never done  . PNA vac Low Risk Adult (2 of 2 - PPSV23) 05/19/2019    There are no preventive care reminders to display for this patient.  No results found for: TSH Lab Results  Component Value Date   WBC 5.1 08/29/2018  HGB 15.4 08/29/2018   HCT 46.5 08/29/2018   MCV 102.4 (H) 08/29/2018   PLT 140 (L) 08/29/2018   Lab Results  Component Value Date   NA 139 08/16/2019   K 5.0 08/16/2019   CO2 32 08/16/2019   GLUCOSE 90 08/16/2019   BUN 15 08/16/2019   CREATININE 1.17 08/16/2019   BILITOT 0.7 08/16/2019   ALKPHOS 62 08/16/2019   AST 17 08/16/2019   ALT 15 08/16/2019   PROT 7.3 08/16/2019   ALBUMIN 4.4 08/16/2019   CALCIUM 9.4 08/16/2019   ANIONGAP 6 08/29/2018   GFR 60.41 08/16/2019   Lab Results  Component Value Date   CHOL 133 08/16/2019   Lab Results  Component Value Date   HDL 41.70 08/16/2019   Lab Results  Component Value Date   LDLCALC 68 08/16/2019   Lab Results  Component Value Date   TRIG 116.0 08/16/2019   Lab Results  Component Value Date   CHOLHDL 3 08/16/2019   No results found for: HGBA1C    Assessment & Plan:   Chronic low back pain stable on low-dose hydrocodone.  He has been on dosage of 10 mg 1/2 tablet twice daily for several years and this seems to be working fairly well.  He feels he is able to be more functional when he is taking the pain medication than without.  -Refilled for 3 months -Recommend 30-month follow-up.  Will need lab work at that point including urine drug screen  Meds ordered this encounter  Medications  . HYDROcodone-acetaminophen (NORCO) 10-325 MG tablet    Sig: Take 1 tablet by mouth every 6 (six) hours as needed.    Dispense:  90 tablet    Refill:  0    Follow-up: Return in about 3 months (around 08/20/2020).    Carolann Littler, MD

## 2020-06-20 ENCOUNTER — Other Ambulatory Visit: Payer: Self-pay | Admitting: Cardiology

## 2020-06-20 ENCOUNTER — Other Ambulatory Visit: Payer: Self-pay | Admitting: Family Medicine

## 2020-06-20 ENCOUNTER — Other Ambulatory Visit: Payer: Self-pay

## 2020-06-20 MED ORDER — ROSUVASTATIN CALCIUM 20 MG PO TABS: 20.0000 mg | ORAL_TABLET | Freq: Every day | ORAL | 0 refills | Status: DC

## 2020-07-19 ENCOUNTER — Other Ambulatory Visit: Payer: Self-pay | Admitting: Family Medicine

## 2020-07-30 ENCOUNTER — Telehealth: Payer: Self-pay | Admitting: Family Medicine

## 2020-08-20 ENCOUNTER — Ambulatory Visit (INDEPENDENT_AMBULATORY_CARE_PROVIDER_SITE_OTHER): Payer: Medicare Other | Admitting: Family Medicine

## 2020-08-20 ENCOUNTER — Other Ambulatory Visit: Payer: Self-pay

## 2020-08-20 ENCOUNTER — Encounter: Payer: Self-pay | Admitting: Family Medicine

## 2020-08-20 VITALS — HR 57 | Ht 76.0 in | Wt 261.0 lb

## 2020-08-20 DIAGNOSIS — I10 Essential (primary) hypertension: Secondary | ICD-10-CM | POA: Diagnosis not present

## 2020-08-20 DIAGNOSIS — I2510 Atherosclerotic heart disease of native coronary artery without angina pectoris: Secondary | ICD-10-CM

## 2020-08-20 DIAGNOSIS — E782 Mixed hyperlipidemia: Secondary | ICD-10-CM | POA: Diagnosis not present

## 2020-08-20 DIAGNOSIS — G8929 Other chronic pain: Secondary | ICD-10-CM

## 2020-08-20 DIAGNOSIS — M5442 Lumbago with sciatica, left side: Secondary | ICD-10-CM

## 2020-08-20 DIAGNOSIS — D6869 Other thrombophilia: Secondary | ICD-10-CM | POA: Diagnosis not present

## 2020-08-20 MED ORDER — CLOPIDOGREL BISULFATE 75 MG PO TABS
75.0000 mg | ORAL_TABLET | Freq: Every day | ORAL | 3 refills | Status: DC
Start: 2020-08-20 — End: 2021-08-18

## 2020-08-20 MED ORDER — METOPROLOL TARTRATE 25 MG PO TABS: ORAL_TABLET | ORAL | 3 refills | Status: DC

## 2020-08-20 MED ORDER — HYDROCODONE-ACETAMINOPHEN 10-325 MG PO TABS: 1.0000 | ORAL_TABLET | Freq: Four times a day (QID) | ORAL | 0 refills | Status: DC | PRN

## 2020-08-20 MED ORDER — GABAPENTIN 300 MG PO CAPS
ORAL_CAPSULE | ORAL | 3 refills | Status: DC
Start: 2020-08-20 — End: 2021-08-18

## 2020-08-20 MED ORDER — LISINOPRIL 10 MG PO TABS
ORAL_TABLET | ORAL | 3 refills | Status: DC
Start: 2020-08-20 — End: 2021-08-18

## 2020-08-20 MED ORDER — ROSUVASTATIN CALCIUM 20 MG PO TABS
20.0000 mg | ORAL_TABLET | Freq: Every day | ORAL | 3 refills | Status: DC
Start: 2020-08-20 — End: 2021-08-18

## 2020-08-20 MED ORDER — ISOSORBIDE MONONITRATE ER 30 MG PO TB24
30.0000 mg | ORAL_TABLET | Freq: Every day | ORAL | 3 refills | Status: DC
Start: 2020-08-20 — End: 2021-08-18

## 2020-08-20 NOTE — Progress Notes (Signed)
Established Patient Office Visit  Subjective:  Patient ID: Rodney Barnett, male    DOB: 08/30/1941  Age: 79 y.o. MRN: [id number]  CC:  Chief Complaint  Patient presents with  . Pain    HPI Rodney Barnett [facility] presents for medical follow-up.  He has history of CAD, hypertension, peripheral neuropathy, bladder cancer, chronic low back pain, hyperlipidemia.  His current medications include rosuvastatin, metoprolol, lisinopril, Imdur gabapentin, Plavix, aspirin, and hydrocodone 10 mg which he takes 1/2 tablet twice daily.  He has been on this regimen for several years for his back pain and this seems to help with his control.  He has not gotten similar controlled with Tylenol.  No recent chest pains.  Compliant with medications.  Needs refills of several medications today.  No recent hematuria.  He has follow-up with urologist soon for surveillance colonoscopy.  We addressed several immunizations today such as flu vaccine and pneumonia vaccine and he declines these.  Past Medical History:  Diagnosis Date  . Chronic back pain   . Chronic rhinitis   . Coronary atherosclerosis of native coronary artery    Multivessel, LVEF 55-60%, basal inferior hypokinesis, known graft disease  . Erectile dysfunction   . Essential hypertension   . Insomnia   . Lipoma   . Mixed hyperlipidemia   . Peripheral neuropathy     Past Surgical History:  Procedure Laterality Date  . CORONARY ARTERY BYPASS GRAFT  1994   LIMA to LAD, SVG to RCA, SVG to diagonal  . TONSILLECTOMY    . TRANSURETHRAL RESECTION OF BLADDER TUMOR N/A 09/09/2018   Procedure: TRANSURETHRAL RESECTION OF BLADDER TUMOR (TURBT) WITH INSTILLATION OF POST OPERATIVE CHEMOTHERAPY;  Surgeon: Kathie Rhodes, MD;  Location: [facility];  Service: Urology;  Laterality: N/A;    Family History  Problem Relation Age of Onset  . Hypertension Other   . Cancer Brother        renal cancer    Social History   Socioeconomic History  . Marital status: Widowed     Spouse name: Not on file  . Number of children: Not on file  . Years of education: Not on file  . Highest education level: Not on file  Occupational History  . Not on file  Tobacco Use  . Smoking status: Former Smoker    Packs/day: 3.00    Years: 40.00    Pack years: 120.00    Types: Cigarettes    Start date: 10/05/1954    Quit date: 01/06/1993    Years since quitting: 27.6  . Smokeless tobacco: Never Used  Vaping Use  . Vaping Use: Never used  Substance and Sexual Activity  . Alcohol use: No    Alcohol/week: 0.0 standard drinks  . Drug use: No  . Sexual activity: Not on file  Other Topics Concern  . Not on file  Social History Narrative  . Not on file   Social Determinants of Health   Financial Resource Strain:   . Difficulty of Paying Living Expenses: Not on file  Food Insecurity:   . Worried About Charity fundraiser in the Last Year: Not on file  . Ran Out of Food in the Last Year: Not on file  Transportation Needs:   . Lack of Transportation (Medical): Not on file  . Lack of Transportation (Non-Medical): Not on file  Physical Activity:   . Days of Exercise per Week: Not on file  . Minutes of Exercise per Session: Not on file  Stress:   . Feeling of Stress : Not on file  Social Connections:   . Frequency of Communication with Friends and Family: Not on file  . Frequency of Social Gatherings with Friends and Family: Not on file  . Attends Religious Services: Not on file  . Active Member of Clubs or Organizations: Not on file  . Attends Archivist Meetings: Not on file  . Marital Status: Not on file  Intimate Partner Violence:   . Fear of Current or Ex-Partner: Not on file  . Emotionally Abused: Not on file  . Physically Abused: Not on file  . Sexually Abused: Not on file    Outpatient Medications Prior to Visit  Medication Sig Dispense Refill  . aspirin 81 MG tablet Take 81 mg by mouth daily.      . clopidogrel (PLAVIX) 75 MG tablet TAKE 1  TABLET ONCE DAILY. 30 tablet 3  . gabapentin (NEURONTIN) 300 MG capsule TAKE (2) CAPSULES BY MOUTH THREE TIMES DAILY. 180 capsule 1  . HYDROcodone-acetaminophen (NORCO) 10-325 MG tablet Take 1 tablet by mouth every 6 (six) hours as needed. 90 tablet 0  . isosorbide mononitrate (IMDUR) 30 MG 24 hr tablet TAKE 1 TABLET BY MOUTH ONCE DAILY. 30 tablet 6  . lisinopril (ZESTRIL) 10 MG tablet TAKE 1 TABLET ONCE DAILY. 90 tablet 0  . metoprolol tartrate (LOPRESSOR) 25 MG tablet TAKE (1) TABLET TWICE DAILY. 60 tablet 0  . nitroGLYCERIN (NITROSTAT) 0.4 MG SL tablet Place 1 tablet (0.4 mg total) under the tongue every 5 (five) minutes x 3 doses as needed for chest pain (If no relief after 3rd dose, proceed to the ED for an evaluation or call 911). 25 tablet 3  . rosuvastatin (CRESTOR) 20 MG tablet Take 1 tablet (20 mg total) by mouth daily. 90 tablet 0   No facility-administered medications prior to visit.    Allergies  Allergen Reactions  . Lipitor [Atorvastatin] Other (See Comments)    myalgia    ROS Review of Systems  Constitutional: Negative for chills, fatigue, fever and unexpected weight change.  Eyes: Negative for visual disturbance.  Respiratory: Negative for cough, chest tightness and shortness of breath.   Cardiovascular: Negative for chest pain, palpitations and leg swelling.  Endocrine: Negative for polydipsia and polyuria.  Genitourinary: Negative for hematuria.  Musculoskeletal: Positive for back pain.  Neurological: Negative for dizziness, syncope, weakness, light-headedness and headaches.      Objective:    Physical Exam Constitutional:      Appearance: He is well-developed.  HENT:     Right Ear: External ear normal.     Left Ear: External ear normal.  Eyes:     Pupils: Pupils are equal, round, and reactive to light.  Neck:     Thyroid: No thyromegaly.  Cardiovascular:     Rate and Rhythm: Normal rate and regular rhythm.  Pulmonary:     Effort: Pulmonary effort is  normal. No respiratory distress.     Breath sounds: Normal breath sounds. No wheezing or rales.  Musculoskeletal:     Cervical back: Neck supple.  Neurological:     Mental Status: He is alert and oriented to person, place, and time.     Pulse (!) 57   Ht 6\' 4"  (1.93 m)   Wt 261 lb (118.4 kg)   SpO2 95%   BMI 31.77 kg/m  Wt Readings from Last 3 Encounters:  08/20/20 261 lb (118.4 kg)  05/20/20 260 lb (117.9 kg)  02/27/20  262 lb 6.4 oz (119 kg)     There are no preventive care reminders to display for this patient.  There are no preventive care reminders to display for this patient.  No results found for: TSH Lab Results  Component Value Date   WBC 5.1 08/29/2018   HGB 15.4 08/29/2018   HCT 46.5 08/29/2018   MCV 102.4 (H) 08/29/2018   PLT 140 (L) 08/29/2018   Lab Results  Component Value Date   NA 139 08/16/2019   K 5.0 08/16/2019   CO2 32 08/16/2019   GLUCOSE 90 08/16/2019   BUN 15 08/16/2019   CREATININE 1.17 08/16/2019   BILITOT 0.7 08/16/2019   ALKPHOS 62 08/16/2019   AST 17 08/16/2019   ALT 15 08/16/2019   PROT 7.3 08/16/2019   ALBUMIN 4.4 08/16/2019   CALCIUM 9.4 08/16/2019   ANIONGAP 6 08/29/2018   GFR 60.41 08/16/2019   Lab Results  Component Value Date   CHOL 133 08/16/2019   Lab Results  Component Value Date   HDL 41.70 08/16/2019   Lab Results  Component Value Date   LDLCALC 68 08/16/2019   Lab Results  Component Value Date   TRIG 116.0 08/16/2019   Lab Results  Component Value Date   CHOLHDL 3 08/16/2019   No results found for: HGBA1C    Assessment & Plan:   #1 chronic low back pain stable on low-dose hydrocodone  -Refill hydrocodone 10 mg to take 1/2 tablet twice daily -Consider drug screen at next visit -Refill gabapentin which he takes 3 times daily  #2 hypertension stable and at goal  -Refill lisinopril 10 mg daily and metoprolol  #3 dyslipidemia in a patient with history of CAD.  Goal LDL less than 70  -Check  lipid and hepatic panel and refill Crestor for 1 year  Health maintenance-recommended flu vaccine and he declines    No orders of the defined types were placed in this encounter.   Follow-up: Return in about 3 months (around 11/20/2020).    Carolann Littler, MD

## 2020-08-21 ENCOUNTER — Telehealth: Payer: Self-pay

## 2020-08-21 LAB — HEPATIC FUNCTION PANEL
AG Ratio: 1.6 (calc) (ref 1.0–2.5)
ALT: 10 U/L (ref 9–46)
AST: 14 U/L (ref 10–35)
Albumin: 4.2 g/dL (ref 3.6–5.1)
Alkaline phosphatase (APISO): 64 U/L (ref 35–144)
Bilirubin, Direct: 0.1 mg/dL (ref 0.0–0.2)
Globulin: 2.7 g/dL (calc) (ref 1.9–3.7)
Indirect Bilirubin: 0.6 mg/dL (calc) (ref 0.2–1.2)
Total Bilirubin: 0.7 mg/dL (ref 0.2–1.2)
Total Protein: 6.9 g/dL (ref 6.1–8.1)

## 2020-08-21 LAB — BASIC METABOLIC PANEL
BUN/Creatinine Ratio: 12 (calc) (ref 6–22)
BUN: 14 mg/dL (ref 7–25)
CO2: 28 mmol/L (ref 20–32)
Calcium: 9.6 mg/dL (ref 8.6–10.3)
Chloride: 102 mmol/L (ref 98–110)
Creat: 1.2 mg/dL — ABNORMAL HIGH (ref 0.70–1.18)
Glucose, Bld: 107 mg/dL — ABNORMAL HIGH (ref 65–99)
Potassium: 4.5 mmol/L (ref 3.5–5.3)
Sodium: 137 mmol/L (ref 135–146)

## 2020-08-21 LAB — CBC WITH DIFFERENTIAL/PLATELET
Absolute Monocytes: 546 cells/uL (ref 200–950)
Basophils Absolute: 73 cells/uL (ref 0–200)
Basophils Relative: 1.4 %
Eosinophils Absolute: 364 cells/uL (ref 15–500)
Eosinophils Relative: 7 %
HCT: 41.1 % (ref 38.5–50.0)
Hemoglobin: 14.3 g/dL (ref 13.2–17.1)
Lymphs Abs: 2028 cells/uL (ref 850–3900)
MCH: 33.8 pg — ABNORMAL HIGH (ref 27.0–33.0)
MCHC: 34.8 g/dL (ref 32.0–36.0)
MCV: 97.2 fL (ref 80.0–100.0)
MPV: 11.3 fL (ref 7.5–12.5)
Monocytes Relative: 10.5 %
Neutro Abs: 2189 cells/uL (ref 1500–7800)
Neutrophils Relative %: 42.1 %
Platelets: 150 10*3/uL (ref 140–400)
RBC: 4.23 10*6/uL (ref 4.20–5.80)
RDW: 13.5 % (ref 11.0–15.0)
Total Lymphocyte: 39 %
WBC: 5.2 10*3/uL (ref 3.8–10.8)

## 2020-08-21 LAB — LIPID PANEL
Cholesterol: 135 mg/dL (ref <200)
HDL: 38 mg/dL — ABNORMAL LOW (ref >=40)
LDL Cholesterol (Calc): 66 mg/dL (calc)
Non-HDL Cholesterol (Calc): 97 mg/dL (calc) (ref <130)
Total CHOL/HDL Ratio: 3.6 (calc) (ref <5.0)
Triglycerides: 264 mg/dL — ABNORMAL HIGH (ref <150)

## 2020-08-21 NOTE — Telephone Encounter (Signed)
-----   Message from [name], MD sent at 08/21/2020  7:12 AM EST ----- Labs all stable except triglycerides are up slightly.  Keep down the sugar and starch intake and continue low saturated fat diet

## 2020-08-21 NOTE — Telephone Encounter (Signed)
Attempted to reach patient on number listed but not inservice  Left message on sisters voicemail for patient to call.

## 2020-11-15 NOTE — Telephone Encounter (Signed)
This encounter was created in error - please disregard.

## 2020-11-19 ENCOUNTER — Telehealth: Payer: Self-pay | Admitting: Family Medicine

## 2020-11-19 MED ORDER — HYDROCODONE-ACETAMINOPHEN 10-325 MG PO TABS: 1.0000 | ORAL_TABLET | Freq: Four times a day (QID) | ORAL | 0 refills | Status: DC | PRN

## 2020-11-19 NOTE — Telephone Encounter (Signed)
Pt would like a refill for: HYDROcodone-acetaminophen ([place]) 10-325 MG tablet  Send to: [facility] [place]  [address] [place], [place] [address]  Phone:  [phone] Fax:  [phone]

## 2021-02-02 IMAGING — DX DG LUMBAR SPINE COMPLETE 4+V
5 series · 5 of 5 positions shown · non-contrast
Comparison: November 10, 2012.

CLINICAL DATA: Progressive left lumbar radiculitis.

EXAM:
LUMBAR SPINE - COMPLETE 4+ VIEW

[lumbar spine ap]
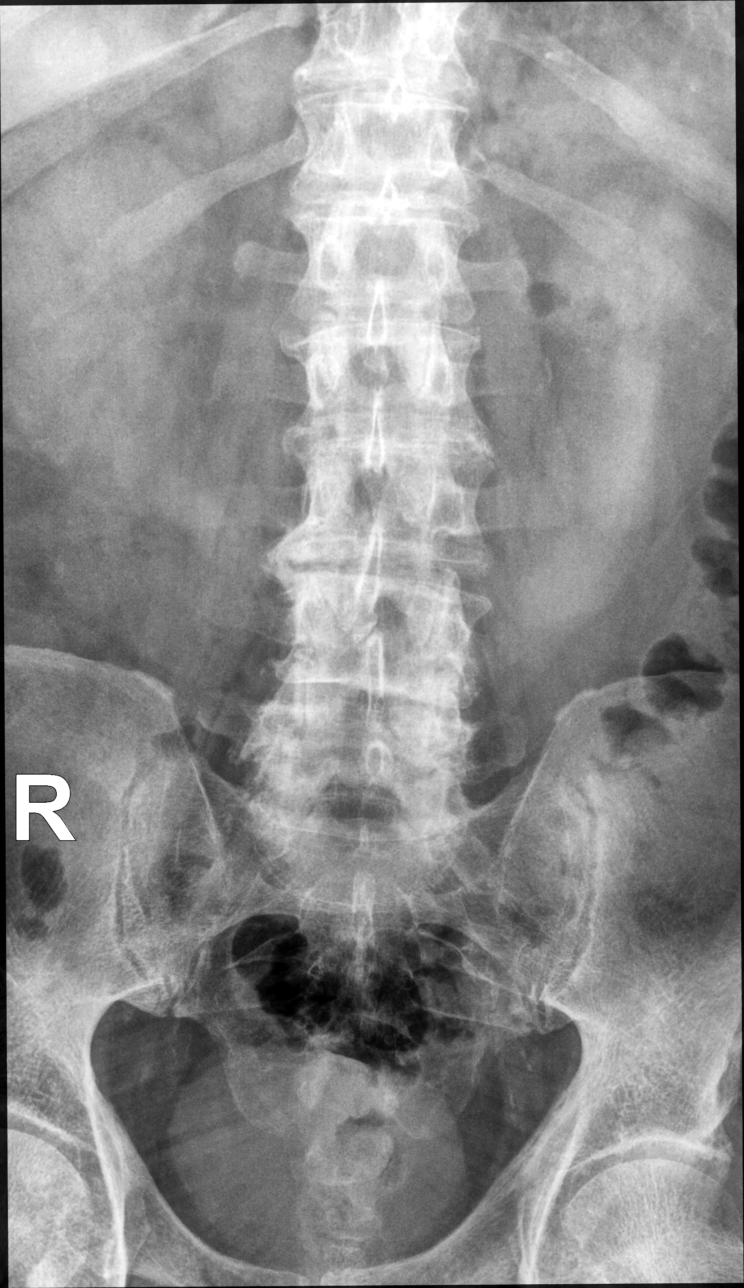

[lumbar spine oblique (1 of 2)]
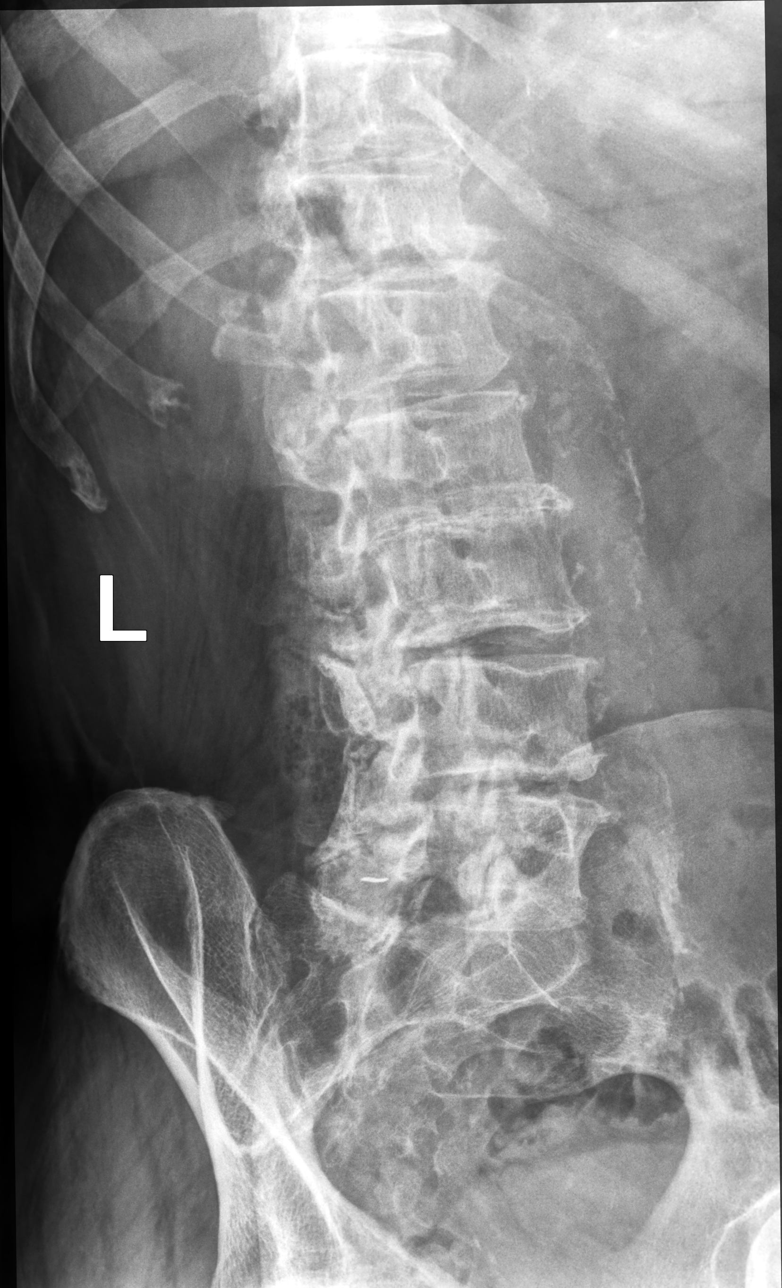

[lumbar spine oblique (2 of 2)]
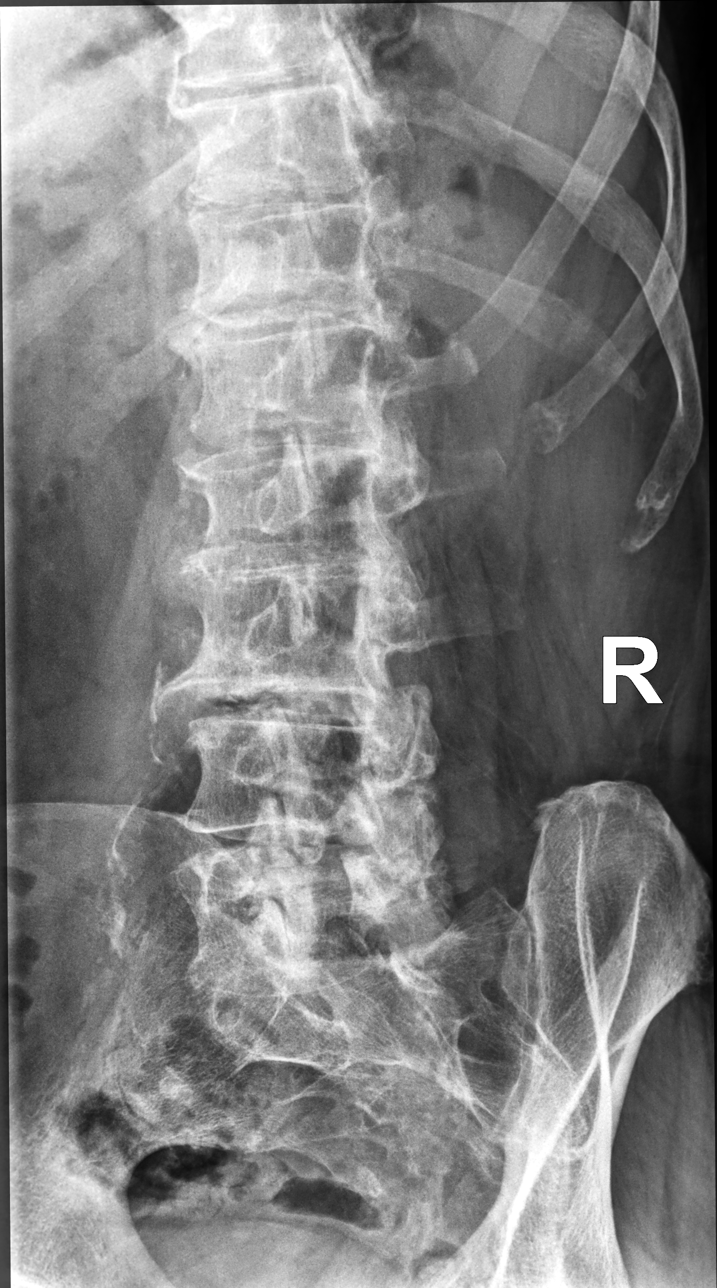

[lumbar spine lat (1 of 2)]
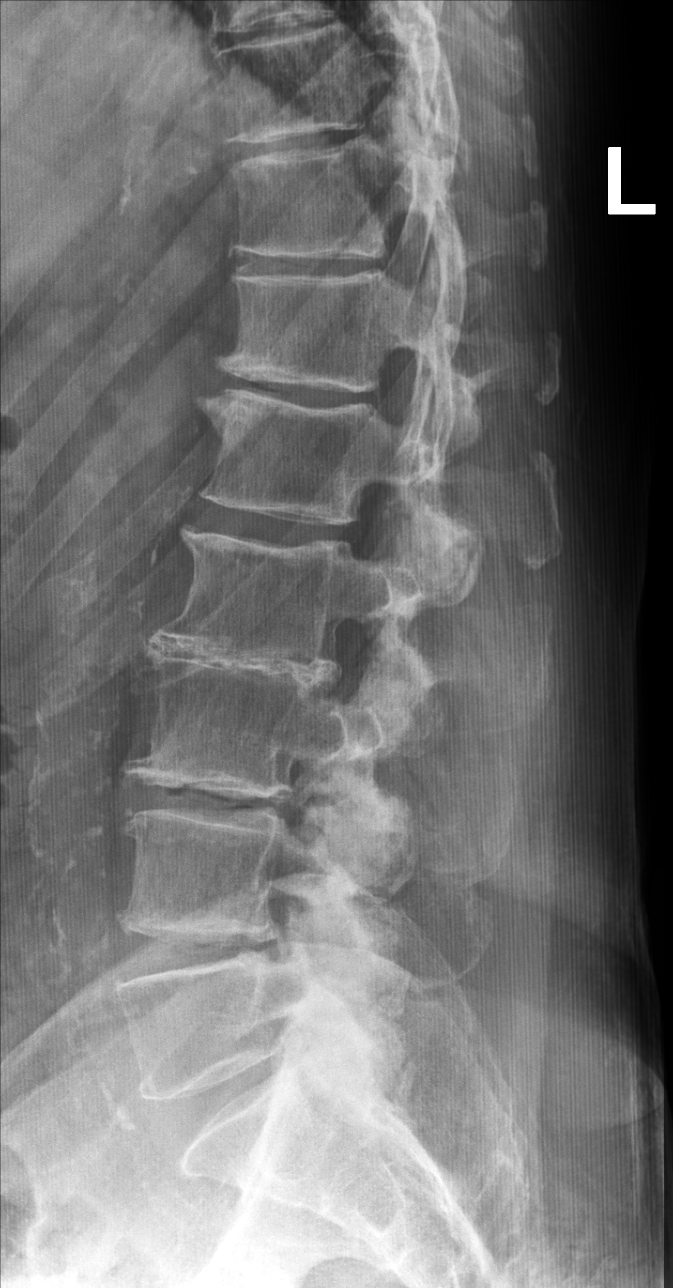

[lumbar spine lat (2 of 2)]
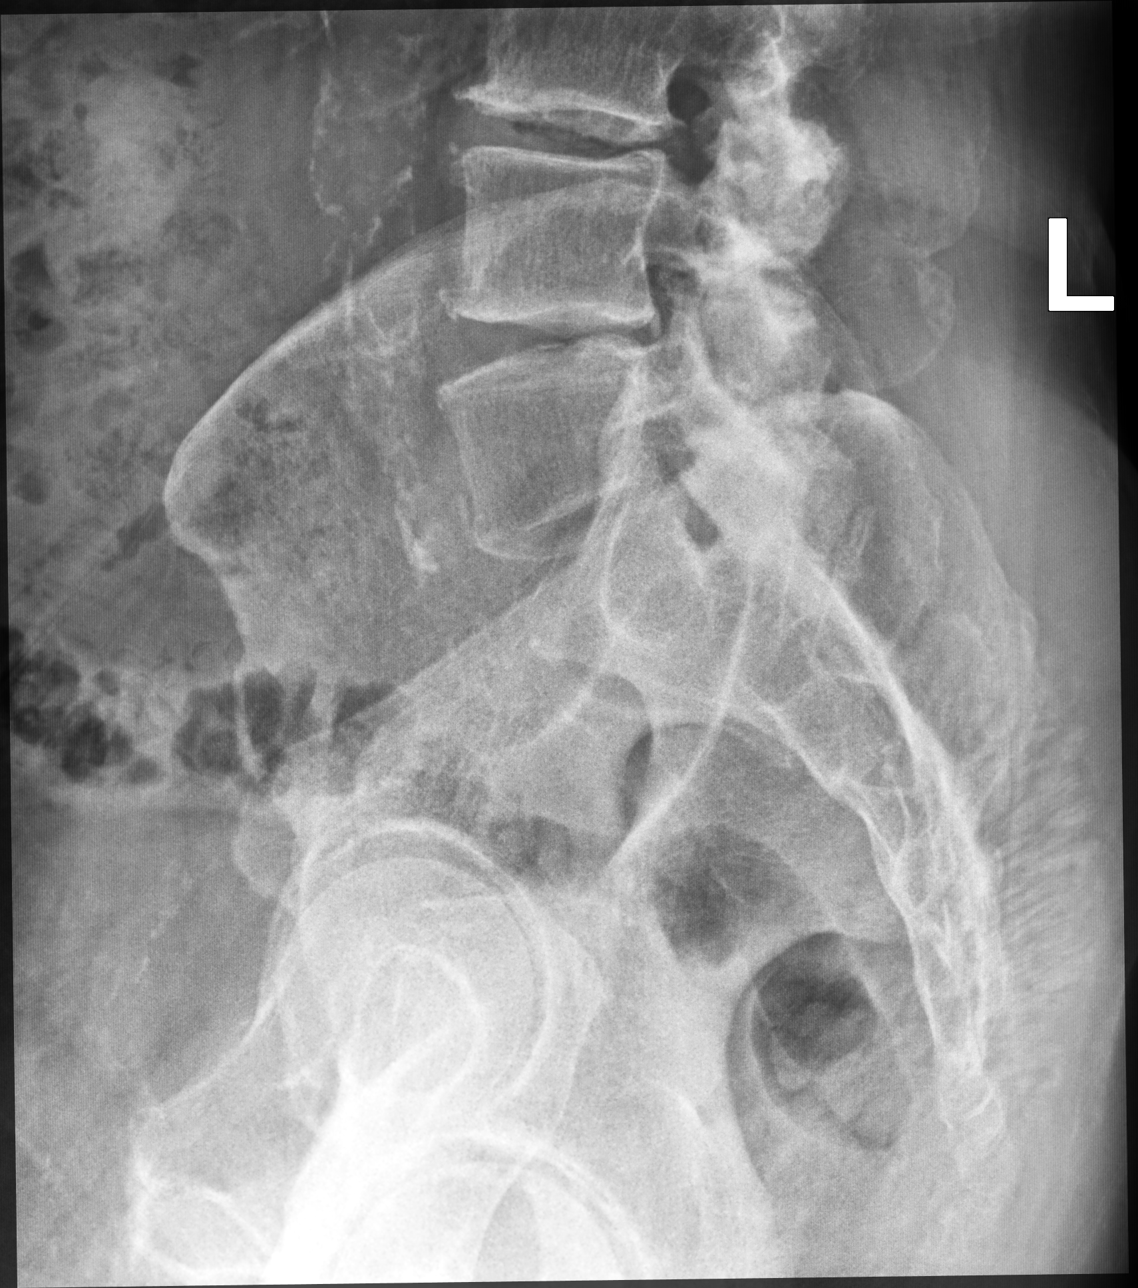

[5 of 5 positions shown; findings below may reference images not displayed]

FINDINGS: No fracture or spondylolisthesis is noted. Severe degenerative disc
disease is noted at L2-3. Mild degenerative disc disease is noted at
L3-4. Atherosclerosis of abdominal aorta is noted.
IMPRESSION: Multilevel degenerative disc disease. No acute abnormality seen in
the lumbar spine.

Aortic Atherosclerosis ([id number]).

## 2021-02-07 ENCOUNTER — Telehealth: Payer: Self-pay | Admitting: Family Medicine

## 2021-02-07 NOTE — Telephone Encounter (Signed)
Pt is calling in wanting to see if Dr. Elease Hashimoto would call something in for his bladder issue stated that he had something done to it last week and not sure as to what is going on with him.  Pt declined to make an appointment to be seen by a provider.  Pharm:  [facility]

## 2021-02-07 NOTE — Telephone Encounter (Signed)
ATC, unable to leave a message.  

## 2021-02-11 NOTE — Telephone Encounter (Signed)
ATC, unable to leave a message.  

## 2021-02-12 NOTE — Telephone Encounter (Signed)
ATC, unable to leave a message.   Unable to reach the patient. This message will be closed.  

## 2021-02-17 ENCOUNTER — Ambulatory Visit (INDEPENDENT_AMBULATORY_CARE_PROVIDER_SITE_OTHER): Payer: Medicare Other | Admitting: Family Medicine

## 2021-02-17 ENCOUNTER — Encounter: Payer: Self-pay | Admitting: Family Medicine

## 2021-02-17 ENCOUNTER — Other Ambulatory Visit: Payer: Self-pay

## 2021-02-17 VITALS — BP 120/62 | HR 62 | Temp 97.4°F | Wt 245.9 lb

## 2021-02-17 DIAGNOSIS — G8929 Other chronic pain: Secondary | ICD-10-CM

## 2021-02-17 DIAGNOSIS — R300 Dysuria: Secondary | ICD-10-CM | POA: Diagnosis not present

## 2021-02-17 DIAGNOSIS — M5442 Lumbago with sciatica, left side: Secondary | ICD-10-CM

## 2021-02-17 DIAGNOSIS — I10 Essential (primary) hypertension: Secondary | ICD-10-CM

## 2021-02-17 LAB — POCT URINALYSIS DIPSTICK
Bilirubin, UA: NEGATIVE
Blood, UA: NEGATIVE
Glucose, UA: NEGATIVE
Ketones, UA: NEGATIVE
Nitrite, UA: POSITIVE
Protein, UA: NEGATIVE
Spec Grav, UA: 1.015 (ref 1.010–1.025)
Urobilinogen, UA: 0.2 E.U./dL
pH, UA: 6 (ref 5.0–8.0)

## 2021-02-17 MED ORDER — CIPROFLOXACIN HCL 500 MG PO TABS: 500.0000 mg | ORAL_TABLET | Freq: Two times a day (BID) | ORAL | 0 refills | Status: DC

## 2021-02-17 MED ORDER — HYDROCODONE-ACETAMINOPHEN 10-325 MG PO TABS: 1.0000 | ORAL_TABLET | Freq: Four times a day (QID) | ORAL | 0 refills | Status: DC | PRN

## 2021-02-17 NOTE — Patient Instructions (Signed)
Urinary Tract Infection, Adult  A urinary tract infection (UTI) is an infection of any part of the urinary tract. The urinary tract includes the kidneys, ureters, bladder, and urethra. These organs make, store, and get rid of urine in the body. An upper UTI affects the ureters and kidneys. A lower UTI affects the bladder and urethra. What are the causes? Most urinary tract infections are caused by bacteria in your genital area around your urethra, where urine leaves your body. These bacteria grow and cause inflammation of your urinary tract. What increases the risk? You are more likely to develop this condition if:  You have a urinary catheter that stays in place.  You are not able to control when you urinate or have a bowel movement (incontinence).  You are male and you: ? Use a spermicide or diaphragm for birth control. ? Have low estrogen levels. ? Are pregnant.  You have certain genes that increase your risk.  You are sexually active.  You take antibiotic medicines.  You have a condition that causes your flow of urine to slow down, such as: ? An enlarged prostate, if you are male. ? Blockage in your urethra. ? A kidney stone. ? A nerve condition that affects your bladder control (neurogenic bladder). ? Not getting enough to drink, or not urinating often.  You have certain medical conditions, such as: ? Diabetes. ? A weak disease-fighting system (immunesystem). ? Sickle cell disease. ? Gout. ? Spinal cord injury. What are the signs or symptoms? Symptoms of this condition include:  Needing to urinate right away (urgency).  Frequent urination. This may include small amounts of urine each time you urinate.  Pain or burning with urination.  Blood in the urine.  Urine that smells bad or unusual.  Trouble urinating.  Cloudy urine.  Vaginal discharge, if you are male.  Pain in the abdomen or the lower back. You may also have:  Vomiting or a decreased  appetite.  Confusion.  Irritability or tiredness.  A fever or chills.  Diarrhea. The first symptom in older adults may be confusion. In some cases, they may not have any symptoms until the infection has worsened. How is this diagnosed? This condition is diagnosed based on your medical history and a physical exam. You may also have other tests, including:  Urine tests.  Blood tests.  Tests for STIs (sexually transmitted infections). If you have had more than one UTI, a cystoscopy or imaging studies may be done to determine the cause of the infections. How is this treated? Treatment for this condition includes:  Antibiotic medicine.  Over-the-counter medicines to treat discomfort.  Drinking enough water to stay hydrated. If you have frequent infections or have other conditions such as a kidney stone, you may need to see a health care provider who specializes in the urinary tract (urologist). In rare cases, urinary tract infections can cause sepsis. Sepsis is a life-threatening condition that occurs when the body responds to an infection. Sepsis is treated in the hospital with IV antibiotics, fluids, and other medicines. Follow these instructions at home: Medicines  Take over-the-counter and prescription medicines only as told by your health care provider.  If you were prescribed an antibiotic medicine, take it as told by your health care provider. Do not stop using the antibiotic even if you start to feel better. General instructions  Make sure you: ? Empty your bladder often and completely. Do not hold urine for long periods of time. ? Empty your bladder after   sex. ? Wipe from front to back after urinating or having a bowel movement if you are male. Use each tissue only one time when you wipe.  Drink enough fluid to keep your urine pale yellow.  Keep all follow-up visits. This is important.   Contact a health care provider if:  Your symptoms do not get better after 1-2  days.  Your symptoms go away and then return. Get help right away if:  You have severe pain in your back or your lower abdomen.  You have a fever or chills.  You have nausea or vomiting. Summary  A urinary tract infection (UTI) is an infection of any part of the urinary tract, which includes the kidneys, ureters, bladder, and urethra.  Most urinary tract infections are caused by bacteria in your genital area.  Treatment for this condition often includes antibiotic medicines.  If you were prescribed an antibiotic medicine, take it as told by your health care provider. Do not stop using the antibiotic even if you start to feel better.  Keep all follow-up visits. This is important. This information is not intended to replace advice given to you by your health care provider. Make sure you discuss any questions you have with your health care provider. Document Revised: 05/03/2020 Document Reviewed: 05/03/2020 Elsevier Patient Education  2021 Elsevier Inc.  

## 2021-02-17 NOTE — Progress Notes (Signed)
Established Patient Office Visit  Subjective:  Patient ID: Rodney Barnett, male    DOB: 1940-12-11  Age: 80 y.o. MRN: [id number]  CC:  Chief Complaint  Patient presents with  . Follow-up    HPI Rodney Barnett [facility] presents for chronic follow-up.  He has history of hypertension, CAD, bladder cancer, chronic back pain.  Denies any recent chest pains.  He sees cardiologist yearly.  Stays quite active with walking on treadmill about half a mile to a mile per day and has 3 or 4 yards that he keeps up with maintenance.  No recent chest pains.  Bladder cancer diagnosed a few years ago.  He went recently for surveillance cystoscopy and had a lot of pain during the procedure.  Had some pain with burning with urination since that time.  No fevers or chills.  Pain at the beginning of urination and improves with urination.  No gross hematuria.  He has chronic low back pain and manages with hydrocodone 10 mg 1/2 tablet twice daily and has been on this regimen for many years.  He feels this allows him to be more functional during the day in a way not permitted by over-the-counter oral analgesics such as regular Tylenol.  No constipation.  Hypertension treated with lisinopril.  He is also on Imdur 30 mg daily.  Takes metoprolol 25 mg twice daily.  No recent dizziness.  No headaches.  No chest pains.    Past Medical History:  Diagnosis Date  . Chronic back pain   . Chronic rhinitis   . Coronary atherosclerosis of native coronary artery    Multivessel, LVEF 55-60%, basal inferior hypokinesis, known graft disease  . Erectile dysfunction   . Essential hypertension   . Insomnia   . Lipoma   . Mixed hyperlipidemia   . Peripheral neuropathy     Past Surgical History:  Procedure Laterality Date  . CORONARY ARTERY BYPASS GRAFT  1994   LIMA to LAD, SVG to RCA, SVG to diagonal  . TONSILLECTOMY    . TRANSURETHRAL RESECTION OF BLADDER TUMOR N/A 09/09/2018   Procedure: TRANSURETHRAL RESECTION OF BLADDER TUMOR  (TURBT) WITH INSTILLATION OF POST OPERATIVE CHEMOTHERAPY;  Surgeon: Kathie Rhodes, MD;  Location: [facility];  Service: Urology;  Laterality: N/A;    Family History  Problem Relation Age of Onset  . Hypertension Other   . Cancer Brother        renal cancer    Social History   Socioeconomic History  . Marital status: Widowed    Spouse name: Not on file  . Number of children: Not on file  . Years of education: Not on file  . Highest education level: Not on file  Occupational History  . Not on file  Tobacco Use  . Smoking status: Former Smoker    Packs/day: 3.00    Years: 40.00    Pack years: 120.00    Types: Cigarettes    Start date: 10/05/1954    Quit date: 01/06/1993    Years since quitting: 28.1  . Smokeless tobacco: Never Used  Vaping Use  . Vaping Use: Never used  Substance and Sexual Activity  . Alcohol use: No    Alcohol/week: 0.0 standard drinks  . Drug use: No  . Sexual activity: Not on file  Other Topics Concern  . Not on file  Social History Narrative  . Not on file   Social Determinants of Health   Financial Resource Strain: Not on file  Food Insecurity:  Not on file  Transportation Needs: Not on file  Physical Activity: Not on file  Stress: Not on file  Social Connections: Not on file  Intimate Partner Violence: Not on file    Outpatient Medications Prior to Visit  Medication Sig Dispense Refill  . aspirin 81 MG tablet Take 81 mg by mouth daily.    . clopidogrel (PLAVIX) 75 MG tablet Take 1 tablet (75 mg total) by mouth daily. 90 tablet 3  . gabapentin (NEURONTIN) 300 MG capsule TAKE (2) CAPSULES BY MOUTH THREE TIMES DAILY. 540 capsule 3  . isosorbide mononitrate (IMDUR) 30 MG 24 hr tablet Take 1 tablet (30 mg total) by mouth daily. 90 tablet 3  . lisinopril (ZESTRIL) 10 MG tablet TAKE 1 TABLET ONCE DAILY. 90 tablet 3  . metoprolol tartrate (LOPRESSOR) 25 MG tablet TAKE (1) TABLET TWICE DAILY. 180 tablet 3  . nitroGLYCERIN (NITROSTAT) 0.4 MG SL tablet  Place 1 tablet (0.4 mg total) under the tongue every 5 (five) minutes x 3 doses as needed for chest pain (If no relief after 3rd dose, proceed to the ED for an evaluation or call 911). 25 tablet 3  . rosuvastatin (CRESTOR) 20 MG tablet Take 1 tablet (20 mg total) by mouth daily. 90 tablet 3  . HYDROcodone-acetaminophen (NORCO) 10-325 MG tablet Take 1 tablet by mouth every 6 (six) hours as needed. 90 tablet 0   No facility-administered medications prior to visit.    Allergies  Allergen Reactions  . Lipitor [Atorvastatin] Other (See Comments)    myalgia    ROS Review of Systems  Constitutional: Negative for fatigue and unexpected weight change.  Eyes: Negative for visual disturbance.  Respiratory: Negative for cough, chest tightness and shortness of breath.   Cardiovascular: Negative for chest pain, palpitations and leg swelling.  Genitourinary: Positive for dysuria.  Neurological: Negative for dizziness, syncope, weakness, light-headedness and headaches.      Objective:    Physical Exam Constitutional:      Appearance: He is well-developed.  HENT:     Right Ear: External ear normal.     Left Ear: External ear normal.  Eyes:     Pupils: Pupils are equal, round, and reactive to light.  Neck:     Thyroid: No thyromegaly.  Cardiovascular:     Rate and Rhythm: Normal rate and regular rhythm.  Pulmonary:     Effort: Pulmonary effort is normal. No respiratory distress.     Breath sounds: Normal breath sounds. No wheezing or rales.  Musculoskeletal:     Cervical back: Neck supple.  Neurological:     Mental Status: He is alert and oriented to person, place, and time.     BP 120/62 (BP Location: Left Arm, Patient Position: Sitting, Cuff Size: Normal)   Pulse 62   Temp (!) 97.4 F (36.3 C) (Oral)   Wt 245 lb 14.4 oz (111.5 kg)   SpO2 93%   BMI 29.93 kg/m  Wt Readings from Last 3 Encounters:  02/17/21 245 lb 14.4 oz (111.5 kg)  08/20/20 261 lb (118.4 kg)  05/20/20 260 lb  (117.9 kg)     Health Maintenance Due  Topic Date Due  . COVID-19 Vaccine (1) Never done    There are no preventive care reminders to display for this patient.  No results found for: TSH Lab Results  Component Value Date   WBC 5.2 08/20/2020   HGB 14.3 08/20/2020   HCT 41.1 08/20/2020   MCV 97.2 08/20/2020   PLT  150 08/20/2020   Lab Results  Component Value Date   NA 137 08/20/2020   K 4.5 08/20/2020   CO2 28 08/20/2020   GLUCOSE 107 (H) 08/20/2020   BUN 14 08/20/2020   CREATININE 1.20 (H) 08/20/2020   BILITOT 0.7 08/20/2020   ALKPHOS 62 08/16/2019   AST 14 08/20/2020   ALT 10 08/20/2020   PROT 6.9 08/20/2020   ALBUMIN 4.4 08/16/2019   CALCIUM 9.6 08/20/2020   ANIONGAP 6 08/29/2018   GFR 60.41 08/16/2019   Lab Results  Component Value Date   CHOL 135 08/20/2020   Lab Results  Component Value Date   HDL 38 (L) 08/20/2020   Lab Results  Component Value Date   LDLCALC 66 08/20/2020   Lab Results  Component Value Date   TRIG 264 (H) 08/20/2020   Lab Results  Component Value Date   CHOLHDL 3.6 08/20/2020   No results found for: HGBA1C    Assessment & Plan:   Problem List Items Addressed This Visit      Unprioritized   Chronic back pain   Relevant Medications   HYDROcodone-acetaminophen (NORCO) 10-325 MG tablet   DYSURIA - Primary   Relevant Orders   POCT urinalysis dipstick (Completed)   Urine Culture   Essential hypertension    -Urine dipstick reveals leukocytes and nitrites which suggests possible infection.  Urine culture sent.  Start Cipro 500 mg twice daily for 7 days pending culture results.  Follow-up immediately for any fever or worsening symptoms  -Refill hydrocodone for 3 months  -Continue regular chronic medications.  Continue close follow-up with cardiology.  Set up 12-month follow-up and plan to get follow-up labs then  Meds ordered this encounter  Medications  . HYDROcodone-acetaminophen (NORCO) 10-325 MG tablet    Sig: Take  1 tablet by mouth every 6 (six) hours as needed.    Dispense:  90 tablet    Refill:  0  . ciprofloxacin (CIPRO) 500 MG tablet    Sig: Take 1 tablet (500 mg total) by mouth 2 (two) times daily.    Dispense:  14 tablet    Refill:  0    Follow-up: Return in about 6 months (around 08/20/2021).    Carolann Littler, MD

## 2021-02-19 LAB — URINE CULTURE
MICRO NUMBER:: 11894216
SPECIMEN QUALITY:: ADEQUATE

## 2021-03-13 ENCOUNTER — Telehealth: Payer: Self-pay | Admitting: Family Medicine

## 2021-03-13 NOTE — Telephone Encounter (Signed)
Tried calling patient to schedule Medicare Annual Wellness Visit (AWV) either virtually or in office.  No answer  awvi 10/05/09 per [facility]   please schedule at anytime with [facility] Nurse Health Advisor 1 or 2   This should be a 45 minute visit.

## 2021-04-15 ENCOUNTER — Telehealth: Payer: Self-pay | Admitting: Family Medicine

## 2021-04-15 NOTE — Telephone Encounter (Signed)
Left message for patient to call back and schedule Medicare Annual Wellness Visit (AWV) either virtually or in office.   awvi 10/05/09 per [facility]   please schedule at anytime with [facility] Nurse Health Advisor 1 or 2   This should be a 45 minute visit.  

## 2021-05-15 ENCOUNTER — Telehealth: Payer: Self-pay | Admitting: Family Medicine

## 2021-05-15 NOTE — Telephone Encounter (Signed)
Tried calling to  schedule Medicare Annual Wellness Visit (AWV) either virtually or in office.   No answer     awvi 10/05/09 per [facility]  please schedule at anytime with [facility] Nurse Health Advisor 1 or 2   This should be a 45 minute visit.

## 2021-05-19 ENCOUNTER — Other Ambulatory Visit: Payer: Self-pay | Admitting: Family Medicine

## 2021-05-19 NOTE — Telephone Encounter (Signed)
Last filled 02/17/2021 Last OV 02/17/2021  Ok to refill?

## 2021-06-27 ENCOUNTER — Ambulatory Visit (INDEPENDENT_AMBULATORY_CARE_PROVIDER_SITE_OTHER): Payer: Medicare Other

## 2021-06-27 DIAGNOSIS — Z0000 Encounter for general adult medical examination without abnormal findings: Secondary | ICD-10-CM

## 2021-06-27 DIAGNOSIS — Z0120 Encounter for dental examination and cleaning without abnormal findings: Secondary | ICD-10-CM | POA: Diagnosis not present

## 2021-06-27 NOTE — Progress Notes (Signed)
Subjective:   Rodney Barnett is a 80 y.o. male who presents for an Initial Medicare Annual Wellness Visit.  I connected with Rodney Barnett today by telephone and verified that I am speaking with the correct person using two identifiers. Location patient: home Location provider: work Persons participating in the virtual visit: patient, provider.   I discussed the limitations, risks, security and privacy concerns of performing an evaluation and management service by telephone and the availability of in person appointments. I also discussed with the patient that there may be a patient responsible charge related to this service. The patient expressed understanding and verbally consented to this telephonic visit.    Interactive audio and video telecommunications were attempted between this provider and patient, however failed, due to patient having technical difficulties OR patient did not have access to video capability.  We continued and completed visit with audio only.    Review of Systems    N/A       Objective:    There were no vitals filed for this visit. There is no height or weight on file to calculate BMI.  Advanced Directives 09/09/2018 08/29/2018  Does Patient Have a Medical Advance Directive? No Yes;No  Would patient like information on creating a medical advance directive? No - Patient declined No - Patient declined    Current Medications (verified) Outpatient Encounter Medications as of 06/27/2021  Medication Sig   aspirin 81 MG tablet Take 81 mg by mouth daily.   ciprofloxacin (CIPRO) 500 MG tablet Take 1 tablet (500 mg total) by mouth 2 (two) times daily.   clopidogrel (PLAVIX) 75 MG tablet Take 1 tablet (75 mg total) by mouth daily.   gabapentin (NEURONTIN) 300 MG capsule TAKE (2) CAPSULES BY MOUTH THREE TIMES DAILY.   HYDROcodone-acetaminophen (NORCO) 10-325 MG tablet TAKE (1) TABLET BY MOUTH EVERY SIX HOURS AS NEEDED   isosorbide mononitrate (IMDUR) 30 MG 24 hr tablet  Take 1 tablet (30 mg total) by mouth daily.   lisinopril (ZESTRIL) 10 MG tablet TAKE 1 TABLET ONCE DAILY.   metoprolol tartrate (LOPRESSOR) 25 MG tablet TAKE (1) TABLET TWICE DAILY.   nitroGLYCERIN (NITROSTAT) 0.4 MG SL tablet Place 1 tablet (0.4 mg total) under the tongue every 5 (five) minutes x 3 doses as needed for chest pain (If no relief after 3rd dose, proceed to the ED for an evaluation or call 911).   rosuvastatin (CRESTOR) 20 MG tablet Take 1 tablet (20 mg total) by mouth daily.   No facility-administered encounter medications on file as of 06/27/2021.    Allergies (verified) Lipitor [atorvastatin]   History: Past Medical History:  Diagnosis Date   Chronic back pain    Chronic rhinitis    Coronary atherosclerosis of native coronary artery    Multivessel, LVEF 55-60%, basal inferior hypokinesis, known graft disease   Erectile dysfunction    Essential hypertension    Insomnia    Lipoma    Mixed hyperlipidemia    Peripheral neuropathy    Past Surgical History:  Procedure Laterality Date   CORONARY ARTERY BYPASS GRAFT  1994   LIMA to LAD, SVG to RCA, SVG to diagonal   TONSILLECTOMY     TRANSURETHRAL RESECTION OF BLADDER TUMOR N/A 09/09/2018   Procedure: TRANSURETHRAL RESECTION OF BLADDER TUMOR (TURBT) WITH INSTILLATION OF POST OPERATIVE CHEMOTHERAPY;  Surgeon: Kathie Rhodes, MD;  Location: [facility];  Service: Urology;  Laterality: N/A;   Family History  Problem Relation Age of Onset   Hypertension Other  Cancer Brother        renal cancer   Social History   Socioeconomic History   Marital status: Widowed    Spouse name: Not on file   Number of children: Not on file   Years of education: Not on file   Highest education level: Not on file  Occupational History   Not on file  Tobacco Use   Smoking status: Former    Packs/day: 3.00    Years: 40.00    Pack years: 120.00    Types: Cigarettes    Start date: 10/05/1954    Quit date: 01/06/1993    Years since  quitting: 28.4   Smokeless tobacco: Never  Vaping Use   Vaping Use: Never used  Substance and Sexual Activity   Alcohol use: No    Alcohol/week: 0.0 standard drinks   Drug use: No   Sexual activity: Not on file  Other Topics Concern   Not on file  Social History Narrative   Not on file   Social Determinants of Health   Financial Resource Strain: Not on file  Food Insecurity: Not on file  Transportation Needs: Not on file  Physical Activity: Not on file  Stress: Not on file  Social Connections: Not on file    Tobacco Counseling Counseling given: Not Answered   Clinical Intake:                 Diabetic?no          Activities of Daily Living No flowsheet data found.  Patient Care Team: [name], MD as PCP - General Domenic Polite Aloha Gell, MD as PCP - Cardiology (Cardiology)  Indicate any recent Medical Services you may have received from other than [facility] providers in the past year (date may be approximate).     Assessment:   This is a routine wellness examination for [name].  Hearing/Vision screen No results found.  Dietary issues and exercise activities discussed:     Goals Addressed   None    Depression Screen PHQ 2/9 Scores 08/16/2019 05/18/2018 05/17/2017 11/18/2015 11/22/2014 06/07/2013  PHQ - 2 Score 0 0 0 0 0 0  PHQ- 9 Score 0 - - - - -    Fall Risk Fall Risk  02/16/2020 05/18/2018 08/11/2017 11/18/2015 11/22/2014  Falls in the past year? 0 No No No No  Number falls in past yr: 0 - - - -  Injury with Fall? 0 - - - -  Follow up Falls evaluation completed - - - -    FALL RISK PREVENTION PERTAINING TO THE HOME:  Any stairs in or around the home? Yes  If so, are there any without handrails? No  Home free of loose throw rugs in walkways, pet beds, electrical cords, etc? Yes  Adequate lighting in your home to reduce risk of falls? Yes   ASSISTIVE DEVICES UTILIZED TO PREVENT FALLS:  Life alert? No  Use of a cane, walker or w/c? No   Grab bars in the bathroom? Yes  Shower chair or bench in shower? Yes  Elevated toilet seat or a handicapped toilet? Yes    Cognitive Function:  Normal cognitive status assessed by direct observation by this Nurse Health Advisor. No abnormalities found.        Immunizations Immunization History  Administered Date(s) Administered   Pneumococcal Conjugate-13 05/18/2018   Zoster, Live 11/18/2015    TDAP status: Due, Education has been provided regarding the importance of this vaccine. Advised may receive this  vaccine at local pharmacy or Health Dept. Aware to provide a copy of the vaccination record if obtained from local pharmacy or Health Dept. Verbalized acceptance and understanding.  Flu Vaccine status: Up to date  Pneumococcal vaccine status: Up to date  Covid-19 vaccine status: Completed vaccines  Qualifies for Shingles Vaccine? Yes   Zostavax completed No   Shingrix Completed?: No.    Education has been provided regarding the importance of this vaccine. Patient has been advised to call insurance company to determine out of pocket expense if they have not yet received this vaccine. Advised may also receive vaccine at local pharmacy or Health Dept. Verbalized acceptance and understanding.  Screening Tests Health Maintenance  Topic Date Due   COVID-19 Vaccine (1) Never done   Zoster Vaccines- Shingrix (1 of 2) Never done   TETANUS/TDAP  08/20/2021 (Originally 05/17/1960)   Hepatitis C Screening  08/20/2021 (Originally 05/17/1959)   HPV VACCINES  Aged Out   INFLUENZA VACCINE  Discontinued    Health Maintenance  Health Maintenance Due  Topic Date Due   COVID-19 Vaccine (1) Never done   Zoster Vaccines- Shingrix (1 of 2) Never done    Colorectal cancer screening: No longer required.   Lung Cancer Screening: (Low Dose CT Chest recommended if Age 79-80 years, 30 pack-year currently smoking OR have quit w/in 15years.) does not qualify.   Lung Cancer Screening Referral:  n/a  Additional Screening:  Hepatitis C Screening: does qualify;   Vision Screening: Recommended annual ophthalmology exams for early detection of glaucoma and other disorders of the eye. Is the patient up to date with their annual eye exam?  Yes  Who is the provider or what is the name of the office in which the patient attends annual eye exams? Dr.Miller  If pt is not established with a provider, would they like to be referred to a provider to establish care? No .   Dental Screening: Recommended annual dental exams for proper oral hygiene  Community Resource Referral / Chronic Care Management: CRR required this visit?  No   CCM required this visit?  No      Plan:     I have personally reviewed and noted the following in the patient's chart:   Medical and social history Use of alcohol, tobacco or illicit drugs  Current medications and supplements including opioid prescriptions. Patient is currently taking opioid prescriptions. Information provided to patient regarding non-opioid alternatives. Patient advised to discuss non-opioid treatment plan with their provider. Functional ability and status Nutritional status Physical activity Advanced directives List of other physicians Hospitalizations, surgeries, and ER visits in previous 12 months Vitals Screenings to include cognitive, depression, and falls Referrals and appointments  In addition, I have reviewed and discussed with patient certain preventive protocols, quality metrics, and best practice recommendations. A written personalized care plan for preventive services as well as general preventive health recommendations were provided to patient.     Randel Pigg, LPN   2/75/1700   Nurse Notes: none

## 2021-06-27 NOTE — Patient Instructions (Signed)
[name] , Thank you for taking time to come for your Medicare Wellness Visit. I appreciate your ongoing commitment to your health goals. Please review the following plan we discussed and let me know if I can assist you in the future.   Screening recommendations/referrals: Colonoscopy: no longer  Recommended yearly ophthalmology/optometry visit for glaucoma screening and checkup Recommended yearly dental visit for hygiene and checkup  Vaccinations: Influenza vaccine: due in fall 2022 Pneumococcal vaccine: completed series  Tdap vaccine: due with injury  Shingles vaccine: will consider     Advanced directives: will provide copies   Conditions/risks identified: none   Next appointment: 08/20/2021  115pm  Dr.Burchette  Preventive Care 79 Years and Older, Male Preventive care refers to lifestyle choices and visits with your health care provider that can promote health and wellness. What does preventive care include? A yearly physical exam. This is also called an annual well check. Dental exams once or twice a year. Routine eye exams. Ask your health care provider how often you should have your eyes checked. Personal lifestyle choices, including: Daily care of your teeth and gums. Regular physical activity. Eating a healthy diet. Avoiding tobacco and drug use. Limiting alcohol use. Practicing safe sex. Taking low doses of aspirin every day. Taking vitamin and mineral supplements as recommended by your health care provider. What happens during an annual well check? The services and screenings done by your health care provider during your annual well check will depend on your age, overall health, lifestyle risk factors, and family history of disease. Counseling  Your health care provider may ask you questions about your: Alcohol use. Tobacco use. Drug use. Emotional well-being. Home and relationship well-being. Sexual activity. Eating habits. History of falls. Memory and  ability to understand (cognition). Work and work Statistician. Screening  You may have the following tests or measurements: Height, weight, and BMI. Blood pressure. Lipid and cholesterol levels. These may be checked every 5 years, or more frequently if you are over 55 years old. Skin check. Lung cancer screening. You may have this screening every year starting at age 79 if you have a 30-pack-year history of smoking and currently smoke or have quit within the past 15 years. Fecal occult blood test (FOBT) of the stool. You may have this test every year starting at age 79. Flexible sigmoidoscopy or colonoscopy. You may have a sigmoidoscopy every 5 years or a colonoscopy every 10 years starting at age 79. Prostate cancer screening. Recommendations will vary depending on your family history and other risks. Hepatitis C blood test. Hepatitis B blood test. Sexually transmitted disease (STD) testing. Diabetes screening. This is done by checking your blood sugar (glucose) after you have not eaten for a while (fasting). You may have this done every 1-3 years. Abdominal aortic aneurysm (AAA) screening. You may need this if you are a current or former smoker. Osteoporosis. You may be screened starting at age 79 if you are at high risk. Talk with your health care provider about your test results, treatment options, and if necessary, the need for more tests. Vaccines  Your health care provider may recommend certain vaccines, such as: Influenza vaccine. This is recommended every year. Tetanus, diphtheria, and acellular pertussis (Tdap, Td) vaccine. You may need a Td booster every 10 years. Zoster vaccine. You may need this after age 79. Pneumococcal 13-valent conjugate (PCV13) vaccine. One dose is recommended after age 79. Pneumococcal polysaccharide (PPSV23) vaccine. One dose is recommended after age 79. Talk to your health  care provider about which screenings and vaccines you need and how often you need  them. This information is not intended to replace advice given to you by your health care provider. Make sure you discuss any questions you have with your health care provider. Document Released: 10/18/2015 Document Revised: 06/10/2016 Document Reviewed: 07/23/2015 Elsevier Interactive Patient Education  2017 [employer] Prevention in the Home Falls can cause injuries. They can happen to people of all ages. There are many things you can do to make your home safe and to help prevent falls. What can I do on the outside of my home? Regularly fix the edges of walkways and driveways and fix any cracks. Remove anything that might make you trip as you walk through a door, such as a raised step or threshold. Trim any bushes or trees on the path to your home. Use bright outdoor lighting. Clear any walking paths of anything that might make someone trip, such as rocks or tools. Regularly check to see if handrails are loose or broken. Make sure that both sides of any steps have handrails. Any raised decks and porches should have guardrails on the edges. Have any leaves, snow, or ice cleared regularly. Use sand or salt on walking paths during winter. Clean up any spills in your garage right away. This includes oil or grease spills. What can I do in the bathroom? Use night lights. Install grab bars by the toilet and in the tub and shower. Do not use towel bars as grab bars. Use non-skid mats or decals in the tub or shower. If you need to sit down in the shower, use a plastic, non-slip stool. Keep the floor dry. Clean up any water that spills on the floor as soon as it happens. Remove soap buildup in the tub or shower regularly. Attach bath mats securely with double-sided non-slip rug tape. Do not have throw rugs and other things on the floor that can make you trip. What can I do in the bedroom? Use night lights. Make sure that you have a light by your bed that is easy to reach. Do not use  any sheets or blankets that are too big for your bed. They should not hang down onto the floor. Have a firm chair that has side arms. You can use this for support while you get dressed. Do not have throw rugs and other things on the floor that can make you trip. What can I do in the kitchen? Clean up any spills right away. Avoid walking on wet floors. Keep items that you use a lot in easy-to-reach places. If you need to reach something above you, use a strong step stool that has a grab bar. Keep electrical cords out of the way. Do not use floor polish or wax that makes floors slippery. If you must use wax, use non-skid floor wax. Do not have throw rugs and other things on the floor that can make you trip. What can I do with my stairs? Do not leave any items on the stairs. Make sure that there are handrails on both sides of the stairs and use them. Fix handrails that are broken or loose. Make sure that handrails are as long as the stairways. Check any carpeting to make sure that it is firmly attached to the stairs. Fix any carpet that is loose or worn. Avoid having throw rugs at the top or bottom of the stairs. If you do have throw rugs, attach them to  the floor with carpet tape. Make sure that you have a light switch at the top of the stairs and the bottom of the stairs. If you do not have them, ask someone to add them for you. What else can I do to help prevent falls? Wear shoes that: Do not have high heels. Have rubber bottoms. Are comfortable and fit you well. Are closed at the toe. Do not wear sandals. If you use a stepladder: Make sure that it is fully opened. Do not climb a closed stepladder. Make sure that both sides of the stepladder are locked into place. Ask someone to hold it for you, if possible. Clearly mark and make sure that you can see: Any grab bars or handrails. First and last steps. Where the edge of each step is. Use tools that help you move around (mobility aids)  if they are needed. These include: Canes. Walkers. Scooters. Crutches. Turn on the lights when you go into a dark area. Replace any light bulbs as soon as they burn out. Set up your furniture so you have a clear path. Avoid moving your furniture around. If any of your floors are uneven, fix them. If there are any pets around you, be aware of where they are. Review your medicines with your doctor. Some medicines can make you feel dizzy. This can increase your chance of falling. Ask your doctor what other things that you can do to help prevent falls. This information is not intended to replace advice given to you by your health care provider. Make sure you discuss any questions you have with your health care provider. Document Released: 07/18/2009 Document Revised: 02/27/2016 Document Reviewed: 10/26/2014 Elsevier Interactive Patient Education  2017 [employer].

## 2021-08-18 ENCOUNTER — Other Ambulatory Visit: Payer: Self-pay | Admitting: Family Medicine

## 2021-08-20 ENCOUNTER — Ambulatory Visit (INDEPENDENT_AMBULATORY_CARE_PROVIDER_SITE_OTHER): Payer: Medicare Other | Admitting: Family Medicine

## 2021-08-20 VITALS — BP 110/50 | HR 75 | Temp 97.9°F | Wt 251.5 lb

## 2021-08-20 DIAGNOSIS — M5442 Lumbago with sciatica, left side: Secondary | ICD-10-CM

## 2021-08-20 DIAGNOSIS — G8929 Other chronic pain: Secondary | ICD-10-CM | POA: Diagnosis not present

## 2021-08-20 DIAGNOSIS — I10 Essential (primary) hypertension: Secondary | ICD-10-CM | POA: Diagnosis not present

## 2021-08-20 DIAGNOSIS — E782 Mixed hyperlipidemia: Secondary | ICD-10-CM | POA: Diagnosis not present

## 2021-08-20 LAB — LIPID PANEL
Cholesterol: 102 mg/dL (ref 0–200)
HDL: 35.9 mg/dL — ABNORMAL LOW (ref >39.00)
LDL Cholesterol: 39 mg/dL (ref 0–99)
NonHDL: 65.71
Total CHOL/HDL Ratio: 3
Triglycerides: 132 mg/dL (ref 0.0–149.0)
VLDL: 26.4 mg/dL (ref 0.0–40.0)

## 2021-08-20 LAB — BASIC METABOLIC PANEL
BUN: 24 mg/dL — ABNORMAL HIGH (ref 6–23)
CO2: 26 mEq/L (ref 19–32)
Calcium: 9 mg/dL (ref 8.4–10.5)
Chloride: 101 mEq/L (ref 96–112)
Creatinine, Ser: 1.41 mg/dL (ref 0.40–1.50)
GFR: 47.48 mL/min — ABNORMAL LOW (ref >60.00)
Glucose, Bld: 80 mg/dL (ref 70–99)
Potassium: 4 mEq/L (ref 3.5–5.1)
Sodium: 136 mEq/L (ref 135–145)

## 2021-08-20 LAB — HEPATIC FUNCTION PANEL
ALT: 7 U/L (ref 0–53)
AST: 13 U/L (ref 0–37)
Albumin: 4.2 g/dL (ref 3.5–5.2)
Alkaline Phosphatase: 70 U/L (ref 39–117)
Bilirubin, Direct: 0.1 mg/dL (ref 0.0–0.3)
Total Bilirubin: 0.9 mg/dL (ref 0.2–1.2)
Total Protein: 7.7 g/dL (ref 6.0–8.3)

## 2021-08-20 MED ORDER — HYDROCODONE-ACETAMINOPHEN 10-325 MG PO TABS: ORAL_TABLET | ORAL | 0 refills | Status: DC

## 2021-08-20 MED ORDER — NITROGLYCERIN 0.4 MG SL SUBL: 0.4000 mg | SUBLINGUAL_TABLET | SUBLINGUAL | 0 refills | Status: DC | PRN

## 2021-08-20 NOTE — Progress Notes (Signed)
Established Patient Office Visit  Subjective:  Patient ID: Rodney Barnett, male    DOB: 11-12-1940  Age: 80 y.o. MRN: [id number]  CC:  Chief Complaint  Patient presents with   Follow-up    Pain manag.    HPI Rodney Barnett presents for medical follow-up.  He has history of hypertension, CAD, history of bladder cancer, spinal stenosis, hyperlipidemia, chronic low back pain.  He takes low-dose hydrocodone 10 mg 1/2 tablet twice daily.  He has been on this regimen for several years and states this helps him tremendously with getting through the day.  He has had some recent mild increase shortness of breath with activities but denies any chest pains.  His 95 year old son died recently of lung cancer.  He does have a great grandson that lives right behind him and he spends a lot of his time with him and is enjoying that very much.  Requesting refills of nitroglycerin.  He has not taken this in years but his current medicine is out of date.  He remains on low-dose metoprolol, gabapentin, rosuvastatin, lisinopril, Imdur, Plavix  Past Medical History:  Diagnosis Date   Chronic back pain    Chronic rhinitis    Coronary atherosclerosis of native coronary artery    Multivessel, LVEF 55-60%, basal inferior hypokinesis, known graft disease   Erectile dysfunction    Essential hypertension    Insomnia    Lipoma    Mixed hyperlipidemia    Peripheral neuropathy     Past Surgical History:  Procedure Laterality Date   CORONARY ARTERY BYPASS GRAFT  1994   LIMA to LAD, SVG to RCA, SVG to diagonal   TONSILLECTOMY     TRANSURETHRAL RESECTION OF BLADDER TUMOR N/A 09/09/2018   Procedure: TRANSURETHRAL RESECTION OF BLADDER TUMOR (TURBT) WITH INSTILLATION OF POST OPERATIVE CHEMOTHERAPY;  Surgeon: Kathie Rhodes, MD;  Location: [facility];  Service: Urology;  Laterality: N/A;    Family History  Problem Relation Age of Onset   Hypertension Other    Cancer Brother        renal cancer    Social History    Socioeconomic History   Marital status: Widowed    Spouse name: Not on file   Number of children: Not on file   Years of education: Not on file   Highest education level: Not on file  Occupational History   Not on file  Tobacco Use   Smoking status: Former    Packs/day: 3.00    Years: 40.00    Pack years: 120.00    Types: Cigarettes    Start date: 10/05/1954    Quit date: 01/06/1993    Years since quitting: 28.6   Smokeless tobacco: Never  Vaping Use   Vaping Use: Never used  Substance and Sexual Activity   Alcohol use: No    Alcohol/week: 0.0 standard drinks   Drug use: No   Sexual activity: Not on file  Other Topics Concern   Not on file  Social History Narrative   Not on file   Social Determinants of Health   Financial Resource Strain: Low Risk    Difficulty of Paying Living Expenses: Not hard at all  Food Insecurity: No Food Insecurity   Worried About Charity fundraiser in the Last Year: Never true   [place] in the Last Year: Never true  Transportation Needs: No Transportation Needs   Lack of Transportation (Medical): No   Lack of Transportation (Non-Medical): No  Physical Activity: Insufficiently Active   Days of Exercise per Week: 3 days   Minutes of Exercise per Session: 30 min  Stress: No Stress Concern Present   Feeling of Stress : Not at all  Social Connections: Socially Isolated   Frequency of Communication with Friends and Family: Three times a week   Frequency of Social Gatherings with Friends and Family: Three times a week   Attends Religious Services: Never   Active Member of Clubs or Organizations: No   Attends Archivist Meetings: Never   Marital Status: Widowed  Human resources officer Violence: Not At Risk   Fear of Current or Ex-Partner: No   Emotionally Abused: No   Physically Abused: No   Sexually Abused: No    Outpatient Medications Prior to Visit  Medication Sig Dispense Refill   aspirin 81 MG tablet Take 81 mg by mouth  daily.     clopidogrel (PLAVIX) 75 MG tablet TAKE 1 TABLET ONCE DAILY. 30 tablet 0   gabapentin (NEURONTIN) 300 MG capsule TAKE (2) CAPSULES BY MOUTH THREE TIMES DAILY. 180 capsule 0   isosorbide mononitrate (IMDUR) 30 MG 24 hr tablet TAKE 1 TABLET ONCE DAILY. 30 tablet 0   lisinopril (ZESTRIL) 10 MG tablet TAKE 1 TABLET ONCE DAILY. 30 tablet 0   metoprolol tartrate (LOPRESSOR) 25 MG tablet TAKE (1) TABLET TWICE DAILY. 60 tablet 0   rosuvastatin (CRESTOR) 20 MG tablet TAKE 1 TABLET ONCE DAILY. 30 tablet 0   ciprofloxacin (CIPRO) 500 MG tablet Take 1 tablet (500 mg total) by mouth 2 (two) times daily. 14 tablet 0   HYDROcodone-acetaminophen (NORCO) 10-325 MG tablet TAKE (1) TABLET BY MOUTH EVERY SIX HOURS AS NEEDED 90 tablet 0   nitroGLYCERIN (NITROSTAT) 0.4 MG SL tablet Place 1 tablet (0.4 mg total) under the tongue every 5 (five) minutes x 3 doses as needed for chest pain (If no relief after 3rd dose, proceed to the ED for an evaluation or call 911). 25 tablet 3   No facility-administered medications prior to visit.    Allergies  Allergen Reactions   Lipitor [Atorvastatin] Other (See Comments)    myalgia    ROS Review of Systems  Constitutional:  Negative for fatigue and unexpected weight change.  Eyes:  Negative for visual disturbance.  Respiratory:  Negative for cough, chest tightness and wheezing.   Cardiovascular:  Negative for chest pain, palpitations and leg swelling.  Neurological:  Negative for dizziness, syncope, weakness, light-headedness and headaches.     Objective:    Physical Exam Constitutional:      Appearance: He is well-developed.  Eyes:     Pupils: Pupils are equal, round, and reactive to light.  Neck:     Thyroid: No thyromegaly.  Cardiovascular:     Rate and Rhythm: Normal rate and regular rhythm.  Pulmonary:     Effort: Pulmonary effort is normal. No respiratory distress.     Breath sounds: Normal breath sounds. No wheezing or rales.  Musculoskeletal:      Cervical back: Neck supple.     Right lower leg: No edema.     Left lower leg: No edema.  Neurological:     Mental Status: He is alert and oriented to person, place, and time.    BP (!) 110/50 (BP Location: Left Arm, Patient Position: Sitting, Cuff Size: Normal)   Pulse 75   Temp 97.9 F (36.6 C) (Oral)   Wt 251 lb 8 oz (114.1 kg)   SpO2 96%  BMI 30.61 kg/m  Wt Readings from Last 3 Encounters:  08/20/21 251 lb 8 oz (114.1 kg)  02/17/21 245 lb 14.4 oz (111.5 kg)  08/20/20 261 lb (118.4 kg)     Health Maintenance Due  Topic Date Due   COVID-19 Vaccine (1) Never done   Zoster Vaccines- Shingrix (1 of 2) Never done   Pneumonia Vaccine 29+ Years old (2 - PPSV23 if available, else PCV20) 05/19/2019    There are no preventive care reminders to display for this patient.  No results found for: TSH Lab Results  Component Value Date   WBC 5.2 08/20/2020   HGB 14.3 08/20/2020   HCT 41.1 08/20/2020   MCV 97.2 08/20/2020   PLT 150 08/20/2020   Lab Results  Component Value Date   NA 137 08/20/2020   K 4.5 08/20/2020   CO2 28 08/20/2020   GLUCOSE 107 (H) 08/20/2020   BUN 14 08/20/2020   CREATININE 1.20 (H) 08/20/2020   BILITOT 0.7 08/20/2020   ALKPHOS 62 08/16/2019   AST 14 08/20/2020   ALT 10 08/20/2020   PROT 6.9 08/20/2020   ALBUMIN 4.4 08/16/2019   CALCIUM 9.6 08/20/2020   ANIONGAP 6 08/29/2018   GFR 60.41 08/16/2019   Lab Results  Component Value Date   CHOL 135 08/20/2020   Lab Results  Component Value Date   HDL 38 (L) 08/20/2020   Lab Results  Component Value Date   LDLCALC 66 08/20/2020   Lab Results  Component Value Date   TRIG 264 (H) 08/20/2020   Lab Results  Component Value Date   CHOLHDL 3.6 08/20/2020   No results found for: HGBA1C    Assessment & Plan:   Problem List Items Addressed This Visit       Unprioritized   Essential hypertension - Primary   Relevant Medications   nitroGLYCERIN (NITROSTAT) 0.4 MG SL tablet   Other  Relevant Orders   Basic metabolic panel   Hyperlipidemia   Relevant Medications   nitroGLYCERIN (NITROSTAT) 0.4 MG SL tablet   Other Relevant Orders   Lipid panel   Hepatic function panel   Chronic back pain   Relevant Medications   HYDROcodone-acetaminophen (NORCO) 10-325 MG tablet   Other Relevant Orders   DRUG MONITORING, PANEL 8 WITH CONFIRMATION, URINE    Meds ordered this encounter  Medications   DISCONTD: nitroGLYCERIN (NITROSTAT) 0.4 MG SL tablet    Sig: Place 1 tablet (0.4 mg total) under the tongue every 5 (five) minutes x 3 doses as needed for chest pain (If no relief after 3rd dose, proceed to the ED for an evaluation or call 911).    Dispense:  25 tablet    Refill:  0   nitroGLYCERIN (NITROSTAT) 0.4 MG SL tablet    Sig: Place 1 tablet (0.4 mg total) under the tongue every 5 (five) minutes x 3 doses as needed for chest pain (If no relief after 3rd dose, proceed to the ED for an evaluation or call 911).    Dispense:  25 tablet    Refill:  0   HYDROcodone-acetaminophen (NORCO) 10-325 MG tablet    Sig: TAKE (1) TABLET BY MOUTH EVERY SIX HOURS AS NEEDED    Dispense:  90 tablet    Refill:  0  Refill medications as above. Obtain urine drug screen Obtain screening labs as above We have encouraged him to follow-up closely with his cardiologist We encouraged him to consider flu vaccine but he declines  Follow-up: No follow-ups  on file.    Niccole Witthuhn, MD 

## 2021-08-20 NOTE — Addendum Note (Signed)
Addended by: Amanda Cockayne on: 08/20/2021 01:41 PM   Modules accepted: Orders

## 2021-08-23 LAB — DRUG MONITORING, PANEL 8 WITH CONFIRMATION, URINE
6 Acetylmorphine: NEGATIVE ng/mL (ref <10)
Alcohol Metabolites: NEGATIVE ng/mL (ref <500)
Amphetamines: NEGATIVE ng/mL (ref <500)
Benzodiazepines: NEGATIVE ng/mL (ref <100)
Buprenorphine, Urine: NEGATIVE ng/mL (ref <5)
Cocaine Metabolite: NEGATIVE ng/mL (ref <150)
Codeine: NEGATIVE ng/mL (ref <50)
Creatinine: 59.8 mg/dL (ref >=20.0)
Hydrocodone: 325 ng/mL — ABNORMAL HIGH (ref <50)
Hydromorphone: NEGATIVE ng/mL (ref <50)
MDMA: NEGATIVE ng/mL (ref <500)
Marijuana Metabolite: NEGATIVE ng/mL (ref <20)
Morphine: NEGATIVE ng/mL (ref <50)
Norhydrocodone: 576 ng/mL — ABNORMAL HIGH (ref <50)
Opiates: POSITIVE ng/mL — AB (ref <100)
Oxidant: NEGATIVE ug/mL (ref <200)
Oxycodone: NEGATIVE ng/mL (ref <100)
pH: 5.6 (ref 4.5–9.0)

## 2021-08-23 LAB — DM TEMPLATE

## 2021-08-27 ENCOUNTER — Other Ambulatory Visit: Payer: Self-pay | Admitting: Cardiology

## 2021-08-27 ENCOUNTER — Encounter: Payer: Self-pay | Admitting: Cardiology

## 2021-08-27 ENCOUNTER — Telehealth: Payer: Self-pay | Admitting: Cardiology

## 2021-08-27 ENCOUNTER — Ambulatory Visit: Payer: Medicare Other | Admitting: Cardiology

## 2021-08-27 ENCOUNTER — Inpatient Hospital Stay (HOSPITAL_COMMUNITY)
Admission: EM | Admit: 2021-08-27 | Discharge: 2021-09-01 | DRG: 378 | Disposition: A | Discharge status: Home / Self Care | Payer: Medicare Other | Attending: Student | Admitting: Student

## 2021-08-27 ENCOUNTER — Emergency Department (HOSPITAL_COMMUNITY): Payer: Medicare Other

## 2021-08-27 ENCOUNTER — Encounter (HOSPITAL_COMMUNITY): Payer: Self-pay

## 2021-08-27 VITALS — BP 136/68 | HR 87 | Ht 76.0 in | Wt 252.8 lb

## 2021-08-27 DIAGNOSIS — K5730 Diverticulosis of large intestine without perforation or abscess without bleeding: Secondary | ICD-10-CM | POA: Diagnosis not present

## 2021-08-27 DIAGNOSIS — Z87891 Personal history of nicotine dependence: Secondary | ICD-10-CM | POA: Diagnosis not present

## 2021-08-27 DIAGNOSIS — Z9221 Personal history of antineoplastic chemotherapy: Secondary | ICD-10-CM

## 2021-08-27 DIAGNOSIS — K31811 Angiodysplasia of stomach and duodenum with bleeding: Principal | ICD-10-CM | POA: Diagnosis present

## 2021-08-27 DIAGNOSIS — Z7982 Long term (current) use of aspirin: Secondary | ICD-10-CM | POA: Diagnosis not present

## 2021-08-27 DIAGNOSIS — I248 Other forms of acute ischemic heart disease: Secondary | ICD-10-CM | POA: Diagnosis not present

## 2021-08-27 DIAGNOSIS — Z20822 Contact with and (suspected) exposure to covid-19: Secondary | ICD-10-CM | POA: Diagnosis present

## 2021-08-27 DIAGNOSIS — I25119 Atherosclerotic heart disease of native coronary artery with unspecified angina pectoris: Secondary | ICD-10-CM

## 2021-08-27 DIAGNOSIS — K635 Polyp of colon: Secondary | ICD-10-CM | POA: Diagnosis not present

## 2021-08-27 DIAGNOSIS — I200 Unstable angina: Secondary | ICD-10-CM | POA: Diagnosis not present

## 2021-08-27 DIAGNOSIS — Z951 Presence of aortocoronary bypass graft: Secondary | ICD-10-CM | POA: Diagnosis not present

## 2021-08-27 DIAGNOSIS — Z8051 Family history of malignant neoplasm of kidney: Secondary | ICD-10-CM | POA: Diagnosis not present

## 2021-08-27 DIAGNOSIS — I129 Hypertensive chronic kidney disease with stage 1 through stage 4 chronic kidney disease, or unspecified chronic kidney disease: Secondary | ICD-10-CM | POA: Diagnosis present

## 2021-08-27 DIAGNOSIS — I10 Essential (primary) hypertension: Secondary | ICD-10-CM | POA: Diagnosis present

## 2021-08-27 DIAGNOSIS — I2510 Atherosclerotic heart disease of native coronary artery without angina pectoris: Secondary | ICD-10-CM | POA: Diagnosis present

## 2021-08-27 DIAGNOSIS — Z01818 Encounter for other preprocedural examination: Secondary | ICD-10-CM | POA: Diagnosis not present

## 2021-08-27 DIAGNOSIS — D509 Iron deficiency anemia, unspecified: Secondary | ICD-10-CM

## 2021-08-27 DIAGNOSIS — K922 Gastrointestinal hemorrhage, unspecified: Secondary | ICD-10-CM | POA: Diagnosis present

## 2021-08-27 DIAGNOSIS — Z8551 Personal history of malignant neoplasm of bladder: Secondary | ICD-10-CM | POA: Diagnosis not present

## 2021-08-27 DIAGNOSIS — N1831 Chronic kidney disease, stage 3a: Secondary | ICD-10-CM | POA: Diagnosis present

## 2021-08-27 DIAGNOSIS — Z01810 Encounter for preprocedural cardiovascular examination: Secondary | ICD-10-CM

## 2021-08-27 DIAGNOSIS — Z79899 Other long term (current) drug therapy: Secondary | ICD-10-CM | POA: Diagnosis not present

## 2021-08-27 DIAGNOSIS — K219 Gastro-esophageal reflux disease without esophagitis: Secondary | ICD-10-CM | POA: Diagnosis present

## 2021-08-27 DIAGNOSIS — D62 Acute posthemorrhagic anemia: Secondary | ICD-10-CM | POA: Diagnosis present

## 2021-08-27 DIAGNOSIS — K648 Other hemorrhoids: Secondary | ICD-10-CM | POA: Diagnosis present

## 2021-08-27 DIAGNOSIS — K641 Second degree hemorrhoids: Secondary | ICD-10-CM

## 2021-08-27 DIAGNOSIS — D124 Benign neoplasm of descending colon: Secondary | ICD-10-CM

## 2021-08-27 DIAGNOSIS — Z7902 Long term (current) use of antithrombotics/antiplatelets: Secondary | ICD-10-CM

## 2021-08-27 DIAGNOSIS — G629 Polyneuropathy, unspecified: Secondary | ICD-10-CM | POA: Diagnosis not present

## 2021-08-27 DIAGNOSIS — R011 Cardiac murmur, unspecified: Secondary | ICD-10-CM

## 2021-08-27 LAB — CBC WITH DIFFERENTIAL/PLATELET
Abs Immature Granulocytes: 0.04 10*3/uL (ref 0.00–0.07)
Basophils Absolute: 0.1 10*3/uL (ref 0.0–0.1)
Basophils Relative: 1 %
Eosinophils Absolute: 0.2 10*3/uL (ref 0.0–0.5)
Eosinophils Relative: 3 %
HCT: 26.2 % — ABNORMAL LOW (ref 39.0–52.0)
Hemoglobin: 7.5 g/dL — ABNORMAL LOW (ref 13.0–17.0)
Immature Granulocytes: 1 %
Lymphocytes Relative: 24 %
Lymphs Abs: 1.5 10*3/uL (ref 0.7–4.0)
MCH: 25.2 pg — ABNORMAL LOW (ref 26.0–34.0)
MCHC: 28.6 g/dL — ABNORMAL LOW (ref 30.0–36.0)
MCV: 87.9 fL (ref 80.0–100.0)
Monocytes Absolute: 0.7 10*3/uL (ref 0.1–1.0)
Monocytes Relative: 11 %
Neutro Abs: 3.8 10*3/uL (ref 1.7–7.7)
Neutrophils Relative %: 60 %
Platelets: 213 10*3/uL (ref 150–400)
RBC: 2.98 MIL/uL — ABNORMAL LOW (ref 4.22–5.81)
RDW: 16.3 % — ABNORMAL HIGH (ref 11.5–15.5)
WBC: 6.3 10*3/uL (ref 4.0–10.5)
nRBC: 0 % (ref 0.0–0.2)

## 2021-08-27 LAB — COMPREHENSIVE METABOLIC PANEL
ALT: 10 U/L (ref 0–44)
AST: 14 U/L — ABNORMAL LOW (ref 15–41)
Albumin: 3.5 g/dL (ref 3.5–5.0)
Alkaline Phosphatase: 58 U/L (ref 38–126)
Anion gap: 8 (ref 5–15)
BUN: 15 mg/dL (ref 8–23)
CO2: 26 mmol/L (ref 22–32)
Calcium: 8.9 mg/dL (ref 8.9–10.3)
Chloride: 101 mmol/L (ref 98–111)
Creatinine, Ser: 1.33 mg/dL — ABNORMAL HIGH (ref 0.61–1.24)
GFR, Estimated: 54 mL/min — ABNORMAL LOW (ref >60)
Glucose, Bld: 129 mg/dL — ABNORMAL HIGH (ref 70–99)
Potassium: 4.4 mmol/L (ref 3.5–5.1)
Sodium: 135 mmol/L (ref 135–145)
Total Bilirubin: 0.8 mg/dL (ref 0.3–1.2)
Total Protein: 7.2 g/dL (ref 6.5–8.1)

## 2021-08-27 LAB — POC OCCULT BLOOD, ED: Fecal Occult Bld: POSITIVE — AB

## 2021-08-27 LAB — RESP PANEL BY RT-PCR (FLU A&B, COVID) ARPGX2
Influenza A by PCR: NEGATIVE
Influenza B by PCR: NEGATIVE
SARS Coronavirus 2 by RT PCR: NEGATIVE

## 2021-08-27 LAB — TROPONIN I (HIGH SENSITIVITY): Troponin I (High Sensitivity): 47 ng/L — ABNORMAL HIGH (ref <18)

## 2021-08-27 LAB — ABO/RH: ABO/RH(D): O POS

## 2021-08-27 MED ORDER — PANTOPRAZOLE SODIUM 40 MG IV SOLR
40.0000 mg | Freq: Two times a day (BID) | INTRAVENOUS | Status: DC
  Administered 2021-08-31 – 2021-09-01 (×3): 40 mg via INTRAVENOUS
  Filled 2021-08-27 (×3): qty 40

## 2021-08-27 MED ORDER — SODIUM CHLORIDE 0.9% FLUSH: 3.0000 mL | Freq: Two times a day (BID) | INTRAVENOUS | Status: DC

## 2021-08-27 MED ORDER — PANTOPRAZOLE 80MG IVPB - SIMPLE MED
80.0000 mg | Freq: Once | INTRAVENOUS | Status: AC
  Administered 2021-08-28: 80 mg via INTRAVENOUS
  Filled 2021-08-27: qty 80

## 2021-08-27 MED ORDER — PANTOPRAZOLE INFUSION (NEW) - SIMPLE MED
8.0000 mg/h | INTRAVENOUS | Status: AC
  Administered 2021-08-28 – 2021-08-30 (×7): 8 mg/h via INTRAVENOUS
  Filled 2021-08-27 (×4): qty 80
  Filled 2021-08-27: qty 100
  Filled 2021-08-27 (×3): qty 80

## 2021-08-27 NOTE — ED Triage Notes (Signed)
Pt reports that he was called by the hospital in [place] and today that his hbg was low and told to come here, reports that he gets SOB when walking long distances and denies blood in stool

## 2021-08-27 NOTE — Progress Notes (Addendum)
Cardiology Office Note  Date: 08/27/2021   ID: Rodney, Rodney Barnett 02, 1942, MRN [id number]  PCP:  Rodney Post, MD  Cardiologist:  Rodney Lesches, MD Electrophysiologist:  None   Chief Complaint  Patient presents with   Cardiac follow-up    History of Present Illness: Rodney Barnett is a 80 y.o. male last seen in May 2021 by Mr. Leonides Sake NP (I last saw him in the office in 2019, telehealth encounter in 2020).  He presents to the office reporting a 2-week history of worsening dyspnea on exertion.  He has generally been very active, [place], takes care of indoor and outdoor chores.  He now has trouble walking to his mailbox without NYHA class III shortness of breath.  He does not report any definite angina or nitroglycerin use however.  States that he has been taking his regular medications.  He has a history of multivessel CAD status Barnett CABG in 1994.  Cardiac catheterization in 2010 revealed multivessel native CAD with occluded SVG to RCA, occluded SVG to diagonal, and patent LIMA to LAD.  He has been managed medically over time and has preferred to hold off on follow-up cardiac structural or ischemic testing in the absence of angina.  I reviewed his current medications which are noted below.  He had recent lab work at visit with PCP, creatinine 1.41 with GFR 47, LDL 39.  I personally reviewed his ECG today which shows sinus rhythm with PACs, new nonspecific anteroseptal and inferior ST abnormalities.  Past Medical History:  Diagnosis Date   Chronic back pain    Chronic rhinitis    Coronary atherosclerosis of native coronary artery    Multivessel, LVEF 55-60%, basal inferior hypokinesis, known graft disease   Erectile dysfunction    Essential hypertension    Insomnia    Lipoma    Mixed hyperlipidemia    Peripheral neuropathy     Past Surgical History:  Procedure Laterality Date   CORONARY ARTERY BYPASS GRAFT  1994   LIMA to LAD, SVG to RCA, SVG to diagonal    TONSILLECTOMY     TRANSURETHRAL RESECTION OF BLADDER TUMOR N/A 09/09/2018   Procedure: TRANSURETHRAL RESECTION OF BLADDER TUMOR (TURBT) WITH INSTILLATION OF Barnett OPERATIVE CHEMOTHERAPY;  Surgeon: Kathie Rhodes, MD;  Location: [facility];  Service: Urology;  Laterality: N/A;    Current Outpatient Medications  Medication Sig Dispense Refill   aspirin 81 MG tablet Take 81 mg by mouth daily.     clopidogrel (PLAVIX) 75 MG tablet TAKE 1 TABLET ONCE DAILY. 30 tablet 0   gabapentin (NEURONTIN) 300 MG capsule TAKE (2) CAPSULES BY MOUTH THREE TIMES DAILY. 180 capsule 0   HYDROcodone-acetaminophen (NORCO) 10-325 MG tablet TAKE (1) TABLET BY MOUTH EVERY SIX HOURS AS NEEDED 90 tablet 0   isosorbide mononitrate (IMDUR) 30 MG 24 hr tablet TAKE 1 TABLET ONCE DAILY. 30 tablet 0   lisinopril (ZESTRIL) 10 MG tablet TAKE 1 TABLET ONCE DAILY. 30 tablet 0   metoprolol tartrate (LOPRESSOR) 25 MG tablet TAKE (1) TABLET TWICE DAILY. 60 tablet 0   nitroGLYCERIN (NITROSTAT) 0.4 MG SL tablet Place 1 tablet (0.4 mg total) under the tongue every 5 (five) minutes x 3 doses as needed for chest pain (If no relief after 3rd dose, proceed to the ED for an evaluation or call 911). 25 tablet 0   rosuvastatin (CRESTOR) 20 MG tablet TAKE 1 TABLET ONCE DAILY. 30 tablet 0   No current facility-administered medications for  this visit.   Allergies:  Lipitor [atorvastatin]   Social History: The patient  reports that he quit smoking about 28 years ago. His smoking use included cigarettes. He started smoking about 66 years ago. He has a 120.00 pack-year smoking history. He has never used smokeless tobacco. He reports that he does not drink alcohol and does not use drugs.   Family History: The patient's family history includes Cancer in his brother; Hypertension in an other family member.   ROS: No orthopnea or PND, no leg swelling, no palpitations or syncope.  Physical Exam: VS:  BP 136/68   Pulse 87   Ht 6\' 4"  (1.93 m)   Wt 252 lb  12.8 oz (114.7 kg)   SpO2 96%   BMI 30.77 kg/m , BMI Body mass index is 30.77 kg/m.  Wt Readings from Last 3 Encounters:  08/27/21 252 lb 12.8 oz (114.7 kg)  08/20/21 251 lb 8 oz (114.1 kg)  02/17/21 245 lb 14.4 oz (111.5 kg)    General: Patient appears comfortable at rest. HEENT: Conjunctiva and lids normal, wearing a mask. Neck: Supple, no elevated JVP or carotid bruits, no thyromegaly. Lungs: Clear to auscultation, nonlabored breathing at rest. Cardiac: Regular rate and rhythm, no S3, 1-0/1 systolic murmur, no pericardial rub. Abdomen: Soft, nontender, bowel sounds present. Extremities: No pitting edema, distal pulses 2+. Skin: Warm and dry. Musculoskeletal: No kyphosis. Neuropsychiatric: Alert and oriented x3, affect grossly appropriate.  ECG:  An ECG dated 02/27/2020 was personally reviewed today and demonstrated:  Sinus bradycardia.  Recent Labwork: 08/20/2021: ALT 7; AST 13; BUN 24; Creatinine, Ser 1.41; Potassium 4.0; Sodium 136     Component Value Date/Time   CHOL 102 08/20/2021 1341   TRIG 132.0 08/20/2021 1341   HDL 35.90 (L) 08/20/2021 1341   CHOLHDL 3 08/20/2021 1341   VLDL 26.4 08/20/2021 1341   LDLCALC 39 08/20/2021 1341   LDLCALC 66 08/20/2020 1115   LDLDIRECT 114.8 06/07/2012 0937  November 2022: Cholesterol 102, triglycerides 132, HDL 35, LDL 39  Other Studies Reviewed Today:  No interval cardiac testing for review today.  Assessment and Plan:  1.  Worsening shortness of breath over the last few weeks concerning for accelerating angina in the setting of known multivessel CAD status Barnett CABG in 1994 with graft disease documented in 2010.  He has preferred to hold off on cardiac structural and ischemic testing for years given clinical stability, however now is in agreement for further work-up with worsening symptoms.  ECG shows new ST segment abnormalities.  He is currently on aspirin, Plavix, Crestor, Imdur, Lopressor, and lisinopril.  Recent lab work  reviewed showing creatinine 1.41 with GFR 49.  LDL 39.  We discussed proceeding to a diagnostic cardiac catheterization which is being scheduled for Friday morning.  He will need follow-up lab work and preprocedure hydration, holding off on lisinopril as well.  We will try and arrange a follow-up echocardiogram while he is there as well to investigate heart murmur and valvular status.  I have explained to him that if his symptoms suddenly escalate further in the interim, he should proceed to the ER for further assessment.  2.  Mixed hyperlipidemia, on Crestor with recent LDL 39.  3.  Essential hypertension, systolics in the 751W today.  No changes made to present regimen.  4.  Systolic murmur in aortic position, follow-up echocardiogram will be obtained.  Addendum: Preparations made for cardiac catheterization on Friday morning.  Sent for lab work and received call from  lab after hours that patient's hemoglobin is 7.6.  He had not voiced any recent bleeding or stool changes.  His last hemoglobin was 14.3 in November 2021.  Patient being contacted with recommendation to go to the ER as he is going to need further work-up since he is clearly symptomatic, need to exclude GI bleed, and also make sure he is not having associated ACS.  Medication Adjustments/Labs and Tests Ordered: Current medicines are reviewed at length with the patient today.  Concerns regarding medicines are outlined above.   Tests Ordered: Orders Placed This Encounter  Procedures   Basic metabolic panel   CBC   EKG 12-Lead   ECHOCARDIOGRAM COMPLETE    Medication Changes: No orders of the defined types were placed in this encounter.   Disposition:  Follow up  after procedure.  Signed, Satira Sark, MD, [facility] 08/27/2021 3:54 PM    [facility] [place], [place], [place] [address] Phone: [phone]; Fax: [phone]

## 2021-08-27 NOTE — Patient Instructions (Signed)
Medication Instructions:  Your physician recommends that you continue on your current medications as directed. Please refer to the Current Medication list given to you today.  Labwork: BMET & CBC today at [facility]  Testing/Procedures: Your physician has requested that you have an echocardiogram. Echocardiography is a painless test that uses sound waves to create images of your heart. It provides your doctor with information about the size and shape of your heart and how well your heart's chambers and valves are working. This procedure takes approximately one hour. There are no restrictions for this procedure. Your physician has requested that you have a cardiac catheterization. Cardiac catheterization is used to diagnose and/or treat various heart conditions. Doctors may recommend this procedure for a number of different reasons. The most common reason is to evaluate chest pain. Chest pain can be a symptom of coronary artery disease (CAD), and cardiac catheterization can show whether plaque is narrowing or blocking your heart's arteries. This procedure is also used to evaluate the valves, as well as measure the blood flow and oxygen levels in different parts of your heart. For further information please visit [email or web address]. Please follow instruction sheet, as given.  Follow-Up: Your physician recommends that you schedule a follow-up appointment in: 1 month  Any Other Special Instructions Will Be Listed Below (If Applicable).  If you need a refill on your cardiac medications before your next appointment, please call your pharmacy.   [place] [address] Dept: [phone] Loc: [phone]  Rodney Barnett  08/27/2021  You are scheduled for a Cardiac Catheterization on Friday, November 25 with Dr. Shelva Majestic.  1. Please arrive at the [facility] (Main  Entrance A) at [facility]: [address] [place], [place] [address] at 8:30 AM (This time is two hours before your procedure to ensure your preparation). Free valet parking service is available.   Special note: Every effort is made to have your procedure done on time. Please understand that emergencies sometimes delay scheduled procedures.  2. Diet: Do not eat solid foods after midnight.  The patient may have clear liquids until 5am upon the day of the procedure.  3. Labs: You will need to have blood drawn on Wednesday, November 23 at [facility]. You do not need to be fasting.  4. Medication instructions in preparation for your procedure:   Contrast Allergy: No   Stop taking, Lisinopril (Zestril or Prinivil) Thursday, November 24,   On the morning of your procedure, take your Aspirin 81 mg and any morning medicines NOT listed above.  You may use sips of water.  5. Plan for one night stay--bring personal belongings. 6. Bring a current list of your medications and current insurance cards. 7. You MUST have a responsible person to drive you home. 8. Someone MUST be with you the first 24 hours after you arrive home or your discharge will be delayed. 9. Please wear clothes that are easy to get on and off and wear slip-on shoes.  Thank you for allowing [place] to care for you!   -- [facility]

## 2021-08-27 NOTE — ED Provider Notes (Signed)
Emergency Medicine Provider Triage Evaluation Note  Rodney Barnett , a 80 y.o. male  was evaluated in triage.  Pt complains of abnormal labs. Pt has had DOE x1 month. Had labs drawn prior to a cardiac cath and was noted to have a hgb of 7. He thinks his stool may be darker than normal  Review of Systems  Positive: Dark stool, sob Negative: Abd pain  Physical Exam  There were no vitals taken for this visit. Gen:   Awake, no distress   Resp:  Normal effort  MSK:   Moves extremities without difficulty  Other:  Pale conjunctiva  Medical Decision Making  Medically screening exam initiated at 9:39 PM.  Appropriate orders placed.  Edward Guthmiller Horlacher was informed that the remainder of the evaluation will be completed by another provider, this initial triage assessment does not replace that evaluation, and the importance of remaining in the ED until their evaluation is complete.     Bishop Dublin 08/27/21 2141    Regan Lemming, MD 08/27/21 2207

## 2021-08-27 NOTE — ED Provider Notes (Signed)
[facility] EMERGENCY DEPARTMENT Provider Note   CSN: [id number] Arrival date & time: 08/27/21  2001     History Chief Complaint  Patient presents with   Abnormal Lab    PHILLIPPE ORLICK is a 80 y.o. male.  The history is provided by the patient and medical records.  Abnormal Lab  80 year old male with HTN, CAD, HLP, insomnia, hx bladder cancer, presenting to the ED for abnormal labs.  States he had screening labs done in anticipation for cardiac cath on Friday.  States he was called today and notified that his hemoglobin was 7.  He does admit to feeling generally weak and has been getting SOB with exertion over the past few weeks.  No syncope reported.  States stools have been black in color intermittently, no frank blood.  Denies any hematuria.  He does take ASA and plavix.  No hx of GI bleeding in the past.  Last colonoscopy 2009 with [facility].  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   Spinal stenosis of lumbar region 12/26/2019   Bladder cancer ([place]) 11/21/2018   Chronic back pain 11/16/2013   Obesity (BMI 30-39.9) 06/07/2013   Peripheral neuropathy 06/07/2012   DYSURIA 05/14/2010   INSOMNIA 04/24/2010   LIPOMA 11/15/2009   RHINITIS 11/15/2009   CORONARY ATHEROSCLEROSIS NATIVE CORONARY ARTERY 10/07/2009   Hyperlipidemia 12/27/2008   ERECTILE DYSFUNCTION 12/27/2008   Essential hypertension 12/27/2008    Past Surgical History:  Procedure Laterality Date   CORONARY ARTERY BYPASS GRAFT  1994   LIMA to LAD, SVG to RCA, SVG to diagonal   TONSILLECTOMY      TRANSURETHRAL RESECTION OF BLADDER TUMOR N/A 09/09/2018   Procedure: TRANSURETHRAL RESECTION OF BLADDER TUMOR (TURBT) WITH INSTILLATION OF POST OPERATIVE CHEMOTHERAPY;  Surgeon: Kathie Rhodes, MD;  Location: [facility];  Service: Urology;  Laterality: N/A;       Family History  Problem Relation Age of Onset   Hypertension Other    Cancer Brother        renal cancer    Social History   Tobacco Use   Smoking status: Former    Packs/day: 3.00    Years: 40.00    Pack years: 120.00    Types: Cigarettes    Start date: 10/05/1954    Quit date: 01/06/1993    Years since quitting: 28.6   Smokeless tobacco: Never  Vaping Use   Vaping Use: Never used  Substance Use Topics   Alcohol use: No    Alcohol/week: 0.0 standard drinks   Drug use: No    Home Medications Prior to Admission medications   Medication Sig Start Date End Date Taking? Authorizing Provider  aspirin 81 MG tablet Take 81 mg by mouth daily.    [provider]  clopidogrel (PLAVIX) 75 MG tablet TAKE 1 TABLET ONCE DAILY. 08/18/21   Burchette, Alinda Sierras, MD  gabapentin (NEURONTIN) 300 MG capsule TAKE (2) CAPSULES BY MOUTH THREE TIMES DAILY. 08/18/21   Burchette, Alinda Sierras, MD  HYDROcodone-acetaminophen (NORCO) 10-325 MG tablet TAKE (1) TABLET  BY MOUTH EVERY SIX HOURS AS NEEDED 08/20/21   Burchette, Alinda Sierras, MD  isosorbide mononitrate (IMDUR) 30 MG 24 hr tablet TAKE 1 TABLET ONCE DAILY. 08/18/21   Burchette, Alinda Sierras, MD  lisinopril (ZESTRIL) 10 MG tablet TAKE 1 TABLET ONCE DAILY. 08/18/21   Burchette, Alinda Sierras, MD  metoprolol tartrate (LOPRESSOR) 25 MG tablet TAKE (1) TABLET TWICE DAILY. 08/18/21   Burchette, Alinda Sierras, MD  nitroGLYCERIN (NITROSTAT) 0.4 MG SL tablet Place 1 tablet (0.4 mg total) under the tongue every 5 (five) minutes x 3 doses as needed for chest pain (If no relief after 3rd dose, proceed to the ED for an evaluation or call 911). 08/20/21   Burchette, Alinda Sierras, MD  rosuvastatin (CRESTOR) 20 MG tablet TAKE 1 TABLET  ONCE DAILY. 08/18/21   Burchette, Alinda Sierras, MD    Allergies    Lipitor [atorvastatin]  Review of Systems   Review of Systems  Constitutional:        Abnormal labs  All other systems reviewed and are negative.  Physical Exam Updated Vital Signs BP 127/61 (BP Location: Left Arm)   Pulse 79   Temp 98.2 F (36.8 C) (Oral)   Resp 18   SpO2 98%   Physical Exam Vitals and nursing note reviewed.  Constitutional:      Appearance: He is well-developed.  HENT:     Head: Normocephalic and atraumatic.  Eyes:     Conjunctiva/sclera: Conjunctivae normal.     Pupils: Pupils are equal, round, and reactive to light.     Comments: Conjunctiva pale  Cardiovascular:     Rate and Rhythm: Normal rate and regular rhythm.     Heart sounds: Normal heart sounds.  Pulmonary:     Effort: Pulmonary effort is normal.     Breath sounds: Normal breath sounds.  Abdominal:     General: Bowel sounds are normal.     Palpations: Abdomen is soft.  Genitourinary:    Comments: Normal rectum, brown stool noted on DRE, no melena or frank blood Musculoskeletal:        General: Normal range of motion.     Cervical back: Normal range of motion.  Skin:    General: Skin is warm and dry.  Neurological:     Mental Status: He is alert and oriented to person, place, and time.    ED Results / Procedures / Treatments   Labs (all labs ordered are listed, but only abnormal results are displayed) Labs Reviewed  CBC WITH DIFFERENTIAL/PLATELET - Abnormal; Notable for the following components:      Result Value   RBC 2.98 (*)    Hemoglobin 7.5 (*)    HCT 26.2 (*)    MCH 25.2 (*)    MCHC 28.6 (*)    RDW 16.3 (*)    All other components within normal limits  COMPREHENSIVE METABOLIC PANEL - Abnormal; Notable for the following components:   Glucose, Bld 129 (*)    Creatinine, Ser 1.33 (*)    AST 14 (*)    GFR, Estimated 54 (*)    All other components within normal limits  TROPONIN I (HIGH SENSITIVITY) -  Abnormal; Notable for the following components:   Troponin I (High Sensitivity) 47 (*)    All other components within normal limits  RESP PANEL BY RT-PCR (FLU A&B, COVID) ARPGX2  POC OCCULT BLOOD, ED  POC OCCULT BLOOD, ED  TYPE AND SCREEN  ABO/RH  TROPONIN I (HIGH SENSITIVITY)    EKG EKG  Interpretation  Date/Time:  Wednesday August 27 2021 21:59:50 EST Ventricular Rate:  86 PR Interval:  164 QRS Duration: 78 QT Interval:  362 QTC Calculation: 433 R Axis:   -9 Text Interpretation: Normal sinus rhythm ST & T wave abnormality, consider anterior ischemia Abnormal ECG lateral ST depressions Confirmed by Ezequiel Essex [phone]) on 08/27/2021 11:03:21 PM  Radiology DG Chest 2 View  Result Date: 08/27/2021 CLINICAL DATA:  Shortness of breath, weakness EXAM: CHEST - 2 VIEW.  Patient is slightly rotated on the frontal view. COMPARISON:  Chest x-ray 09/06/2009 FINDINGS: The heart and mediastinal contours are unchanged. Cardiac surgical changes overlie the mediastinum. Left base atelectasis. Slightly increased interstitial and airspace markings within the left lower lobe. No focal consolidation. Chronic coarsened interstitial markings with no overt pulmonary edema. No pleural effusion. No pneumothorax. No acute osseous abnormality. IMPRESSION: Chronic bronchitic changes with query superimposed retrocardiac infection/inflammation. Electronically Signed   By: Iven Finn M.D.   On: 08/27/2021 22:21    Procedures Procedures   CRITICAL CARE Performed by: Larene Pickett   Total critical care time: 45 minutes  Critical care time was exclusive of separately billable procedures and treating other patients.  Critical care was necessary to treat or prevent imminent or life-threatening deterioration.  Critical care was time spent personally by me on the following activities: development of treatment plan with patient and/or surrogate as well as nursing, discussions with consultants,  evaluation of patient's response to treatment, examination of patient, obtaining history from patient or surrogate, ordering and performing treatments and interventions, ordering and review of laboratory studies, ordering and review of radiographic studies, pulse oximetry and re-evaluation of patient's condition.   Medications Ordered in ED Medications - No data to display  ED Course  I have reviewed the triage vital signs and the nursing notes.  Pertinent labs & imaging results that were available during my care of the patient were reviewed by me and considered in my medical decision making (see chart for details).    MDM Rules/Calculators/A&P                           80 y.o. M presenting to the ED with abnormal labs.  Had these done in anticipation for cardiac cath on Friday, notified that his hemoglobin was 7.  Does admit to some dyspnea on exertion and some generalized weakness.  Also reports some intermittently dark stools but no frank blood.  He is on aspirin and Plavix.  He is afebrile and nontoxic, hemodynamically stable.  Conjunctiva are pale.  Brown stool noted on rectal exam without frank melena or blood but Hemoccult is positive.  Hemoglobin today is 7.5, appears his baseline is around 13-14.  Remains HD stable without tachycardia or hypotension.  Will start protonix bolus and drip.  He will require admission, GI consult in AM.  Discussed with Dr. Tonie Griffith, will admit for ongoing care.  Secure chat message sent to on call GI for AM consultation.  Final Clinical Impression(s) / ED Diagnoses Final diagnoses:  Gastrointestinal hemorrhage, unspecified gastrointestinal hemorrhage type    Rx / DC Orders ED Discharge Orders     None        Larene Pickett, PA-C 08/28/21 0148    Ezequiel Essex, MD 08/28/21 [phone]

## 2021-08-27 NOTE — Telephone Encounter (Signed)
Pt was seen for increasing SOB and plans for cardiac cath on Friday, he was sent for labs and his Hgb was 7.6 and Hct 26.  I discussed with Dr. Domenic Polite and I called pt and asked him to come to [facility] for admit for anemia and then cardiac cath.   It was felt [facility] would be best since cath is scheduled for Friday.   Pt understood.

## 2021-08-28 ENCOUNTER — Encounter (HOSPITAL_COMMUNITY): Payer: Self-pay | Admitting: Family Medicine

## 2021-08-28 DIAGNOSIS — R195 Other fecal abnormalities: Secondary | ICD-10-CM | POA: Diagnosis not present

## 2021-08-28 DIAGNOSIS — K31819 Angiodysplasia of stomach and duodenum without bleeding: Secondary | ICD-10-CM | POA: Diagnosis not present

## 2021-08-28 DIAGNOSIS — Z79899 Other long term (current) drug therapy: Secondary | ICD-10-CM | POA: Diagnosis not present

## 2021-08-28 DIAGNOSIS — Z951 Presence of aortocoronary bypass graft: Secondary | ICD-10-CM | POA: Diagnosis not present

## 2021-08-28 DIAGNOSIS — R011 Cardiac murmur, unspecified: Secondary | ICD-10-CM

## 2021-08-28 DIAGNOSIS — I10 Essential (primary) hypertension: Secondary | ICD-10-CM

## 2021-08-28 DIAGNOSIS — K31811 Angiodysplasia of stomach and duodenum with bleeding: Secondary | ICD-10-CM | POA: Diagnosis not present

## 2021-08-28 DIAGNOSIS — K922 Gastrointestinal hemorrhage, unspecified: Secondary | ICD-10-CM | POA: Diagnosis present

## 2021-08-28 DIAGNOSIS — R0602 Shortness of breath: Secondary | ICD-10-CM | POA: Diagnosis not present

## 2021-08-28 DIAGNOSIS — G629 Polyneuropathy, unspecified: Secondary | ICD-10-CM | POA: Diagnosis not present

## 2021-08-28 DIAGNOSIS — K635 Polyp of colon: Secondary | ICD-10-CM | POA: Diagnosis not present

## 2021-08-28 DIAGNOSIS — D62 Acute posthemorrhagic anemia: Secondary | ICD-10-CM | POA: Diagnosis not present

## 2021-08-28 DIAGNOSIS — Z9221 Personal history of antineoplastic chemotherapy: Secondary | ICD-10-CM | POA: Diagnosis not present

## 2021-08-28 DIAGNOSIS — N1832 Chronic kidney disease, stage 3b: Secondary | ICD-10-CM

## 2021-08-28 DIAGNOSIS — K219 Gastro-esophageal reflux disease without esophagitis: Secondary | ICD-10-CM | POA: Diagnosis not present

## 2021-08-28 DIAGNOSIS — I2510 Atherosclerotic heart disease of native coronary artery without angina pectoris: Secondary | ICD-10-CM | POA: Diagnosis not present

## 2021-08-28 DIAGNOSIS — Z8051 Family history of malignant neoplasm of kidney: Secondary | ICD-10-CM | POA: Diagnosis not present

## 2021-08-28 DIAGNOSIS — D500 Iron deficiency anemia secondary to blood loss (chronic): Secondary | ICD-10-CM | POA: Diagnosis not present

## 2021-08-28 DIAGNOSIS — Z20822 Contact with and (suspected) exposure to covid-19: Secondary | ICD-10-CM | POA: Diagnosis not present

## 2021-08-28 DIAGNOSIS — Z7982 Long term (current) use of aspirin: Secondary | ICD-10-CM | POA: Diagnosis not present

## 2021-08-28 DIAGNOSIS — Z8551 Personal history of malignant neoplasm of bladder: Secondary | ICD-10-CM | POA: Diagnosis not present

## 2021-08-28 DIAGNOSIS — Z87891 Personal history of nicotine dependence: Secondary | ICD-10-CM | POA: Diagnosis not present

## 2021-08-28 DIAGNOSIS — D120 Benign neoplasm of cecum: Secondary | ICD-10-CM | POA: Diagnosis not present

## 2021-08-28 DIAGNOSIS — I350 Nonrheumatic aortic (valve) stenosis: Secondary | ICD-10-CM | POA: Diagnosis not present

## 2021-08-28 DIAGNOSIS — K648 Other hemorrhoids: Secondary | ICD-10-CM | POA: Diagnosis not present

## 2021-08-28 DIAGNOSIS — I129 Hypertensive chronic kidney disease with stage 1 through stage 4 chronic kidney disease, or unspecified chronic kidney disease: Secondary | ICD-10-CM | POA: Diagnosis not present

## 2021-08-28 DIAGNOSIS — R0609 Other forms of dyspnea: Secondary | ICD-10-CM

## 2021-08-28 DIAGNOSIS — K5730 Diverticulosis of large intestine without perforation or abscess without bleeding: Secondary | ICD-10-CM | POA: Diagnosis not present

## 2021-08-28 DIAGNOSIS — D124 Benign neoplasm of descending colon: Secondary | ICD-10-CM | POA: Diagnosis not present

## 2021-08-28 DIAGNOSIS — Z7902 Long term (current) use of antithrombotics/antiplatelets: Secondary | ICD-10-CM | POA: Diagnosis not present

## 2021-08-28 DIAGNOSIS — I248 Other forms of acute ischemic heart disease: Secondary | ICD-10-CM | POA: Diagnosis not present

## 2021-08-28 DIAGNOSIS — N1831 Chronic kidney disease, stage 3a: Secondary | ICD-10-CM | POA: Diagnosis not present

## 2021-08-28 DIAGNOSIS — K921 Melena: Secondary | ICD-10-CM | POA: Diagnosis not present

## 2021-08-28 LAB — CBC
HCT: 28.3 % — ABNORMAL LOW (ref 39.0–52.0)
Hemoglobin: 8.1 g/dL — ABNORMAL LOW (ref 13.0–17.0)
MCH: 25.2 pg — ABNORMAL LOW (ref 26.0–34.0)
MCHC: 28.6 g/dL — ABNORMAL LOW (ref 30.0–36.0)
MCV: 88.2 fL (ref 80.0–100.0)
Platelets: 178 10*3/uL (ref 150–400)
RBC: 3.21 MIL/uL — ABNORMAL LOW (ref 4.22–5.81)
RDW: 16 % — ABNORMAL HIGH (ref 11.5–15.5)
WBC: 4.2 10*3/uL (ref 4.0–10.5)
nRBC: 0 % (ref 0.0–0.2)

## 2021-08-28 LAB — BASIC METABOLIC PANEL
Anion gap: 6 (ref 5–15)
BUN: 15 mg/dL (ref 8–23)
CO2: 28 mmol/L (ref 22–32)
Calcium: 8.6 mg/dL — ABNORMAL LOW (ref 8.9–10.3)
Chloride: 103 mmol/L (ref 98–111)
Creatinine, Ser: 1.47 mg/dL — ABNORMAL HIGH (ref 0.61–1.24)
GFR, Estimated: 48 mL/min — ABNORMAL LOW (ref >60)
Glucose, Bld: 116 mg/dL — ABNORMAL HIGH (ref 70–99)
Potassium: 3.8 mmol/L (ref 3.5–5.1)
Sodium: 137 mmol/L (ref 135–145)

## 2021-08-28 LAB — IRON AND TIBC
Iron: 23 ug/dL — ABNORMAL LOW (ref 45–182)
Saturation Ratios: 6 % — ABNORMAL LOW (ref 17.9–39.5)
TIBC: 405 ug/dL (ref 250–450)
UIBC: 382 ug/dL

## 2021-08-28 LAB — TROPONIN I (HIGH SENSITIVITY)
Troponin I (High Sensitivity): 44 ng/L — ABNORMAL HIGH (ref <18)
Troponin I (High Sensitivity): 36 ng/L — ABNORMAL HIGH (ref <18)
Troponin I (High Sensitivity): 31 ng/L — ABNORMAL HIGH (ref <18)

## 2021-08-28 LAB — HEMOGLOBIN AND HEMATOCRIT, BLOOD
HCT: 27.3 % — ABNORMAL LOW (ref 39.0–52.0)
Hemoglobin: 8 g/dL — ABNORMAL LOW (ref 13.0–17.0)

## 2021-08-28 LAB — RETICULOCYTES
Immature Retic Fract: 27.9 % — ABNORMAL HIGH (ref 2.3–15.9)
RBC.: 3.02 MIL/uL — ABNORMAL LOW (ref 4.22–5.81)
Retic Count, Absolute: 120.8 10*3/uL (ref 19.0–186.0)
Retic Ct Pct: 4 % — ABNORMAL HIGH (ref 0.4–3.1)

## 2021-08-28 LAB — PREPARE RBC (CROSSMATCH)

## 2021-08-28 LAB — FOLATE: Folate: 11.5 ng/mL (ref >5.9)

## 2021-08-28 LAB — FERRITIN: Ferritin: 5 ng/mL — ABNORMAL LOW (ref 24–336)

## 2021-08-28 LAB — VITAMIN B12: Vitamin B-12: 246 pg/mL (ref 180–914)

## 2021-08-28 MED ORDER — LISINOPRIL 10 MG PO TABS: 10.0000 mg | ORAL_TABLET | Freq: Every day | ORAL | Status: DC

## 2021-08-28 MED ORDER — HYDROCODONE-ACETAMINOPHEN 10-325 MG PO TABS
0.5000 | ORAL_TABLET | Freq: Four times a day (QID) | ORAL | Status: DC | PRN
  Administered 2021-08-28 – 2021-08-30 (×3): 0.5 via ORAL
  Filled 2021-08-28 (×3): qty 1

## 2021-08-28 MED ORDER — GABAPENTIN 300 MG PO CAPS
300.0000 mg | ORAL_CAPSULE | Freq: Once | ORAL | Status: AC
  Administered 2021-08-28: 300 mg via ORAL

## 2021-08-28 MED ORDER — SODIUM CHLORIDE 0.9 % WEIGHT BASED INFUSION: 3.0000 mL/kg/h | INTRAVENOUS | Status: DC

## 2021-08-28 MED ORDER — LACTATED RINGERS IV SOLN: INTRAVENOUS | Status: DC

## 2021-08-28 MED ORDER — ACETAMINOPHEN 650 MG RE SUPP: 650.0000 mg | Freq: Four times a day (QID) | RECTAL | Status: DC | PRN

## 2021-08-28 MED ORDER — ONDANSETRON HCL 4 MG/2ML IJ SOLN: 4.0000 mg | Freq: Four times a day (QID) | INTRAMUSCULAR | Status: DC | PRN

## 2021-08-28 MED ORDER — SODIUM CHLORIDE 0.9 % IV SOLN: 250.0000 mL | INTRAVENOUS | Status: DC | PRN

## 2021-08-28 MED ORDER — ROSUVASTATIN CALCIUM 20 MG PO TABS
20.0000 mg | ORAL_TABLET | Freq: Every day | ORAL | Status: DC
  Administered 2021-08-28 – 2021-08-31 (×5): 20 mg via ORAL
  Filled 2021-08-28 (×5): qty 1

## 2021-08-28 MED ORDER — METOPROLOL TARTRATE 25 MG PO TABS
25.0000 mg | ORAL_TABLET | Freq: Two times a day (BID) | ORAL | Status: DC
  Administered 2021-08-28 – 2021-09-01 (×10): 25 mg via ORAL
  Filled 2021-08-28 (×10): qty 1

## 2021-08-28 MED ORDER — SODIUM CHLORIDE 0.9% FLUSH: 3.0000 mL | INTRAVENOUS | Status: DC | PRN

## 2021-08-28 MED ORDER — ASPIRIN 81 MG PO CHEW
81.0000 mg | CHEWABLE_TABLET | ORAL | Status: AC
  Administered 2021-08-29: 81 mg via ORAL
  Filled 2021-08-28: qty 1

## 2021-08-28 MED ORDER — ONDANSETRON HCL 4 MG PO TABS: 4.0000 mg | ORAL_TABLET | Freq: Four times a day (QID) | ORAL | Status: DC | PRN

## 2021-08-28 MED ORDER — DIPHENHYDRAMINE HCL 25 MG PO CAPS
25.0000 mg | ORAL_CAPSULE | Freq: Once | ORAL | Status: AC
  Administered 2021-08-28: 25 mg via ORAL
  Filled 2021-08-28: qty 1

## 2021-08-28 MED ORDER — SODIUM CHLORIDE 0.9 % WEIGHT BASED INFUSION: 1.0000 mL/kg/h | INTRAVENOUS | Status: DC

## 2021-08-28 MED ORDER — SENNOSIDES-DOCUSATE SODIUM 8.6-50 MG PO TABS: 1.0000 | ORAL_TABLET | Freq: Every evening | ORAL | Status: DC | PRN

## 2021-08-28 MED ORDER — GABAPENTIN 300 MG PO CAPS
600.0000 mg | ORAL_CAPSULE | Freq: Three times a day (TID) | ORAL | Status: DC
  Administered 2021-08-28 – 2021-08-29 (×4): 600 mg via ORAL
  Filled 2021-08-28 (×5): qty 2

## 2021-08-28 MED ORDER — ACETAMINOPHEN 325 MG PO TABS
650.0000 mg | ORAL_TABLET | Freq: Once | ORAL | Status: AC
  Administered 2021-08-28: 650 mg via ORAL
  Filled 2021-08-28: qty 2

## 2021-08-28 MED ORDER — SODIUM CHLORIDE 0.9% IV SOLUTION: Freq: Once | INTRAVENOUS | Status: AC

## 2021-08-28 MED ORDER — ACETAMINOPHEN 325 MG PO TABS
650.0000 mg | ORAL_TABLET | Freq: Four times a day (QID) | ORAL | Status: DC | PRN
  Administered 2021-08-29: 650 mg via ORAL
  Filled 2021-08-28: qty 2

## 2021-08-28 MED ORDER — FUROSEMIDE 10 MG/ML IJ SOLN
20.0000 mg | Freq: Once | INTRAMUSCULAR | Status: AC
  Administered 2021-08-28: 20 mg via INTRAVENOUS
  Filled 2021-08-28: qty 2

## 2021-08-28 MED ORDER — ISOSORBIDE MONONITRATE ER 30 MG PO TB24
30.0000 mg | ORAL_TABLET | Freq: Every day | ORAL | Status: DC
  Administered 2021-08-28 – 2021-09-01 (×5): 30 mg via ORAL
  Filled 2021-08-28 (×5): qty 1

## 2021-08-28 NOTE — Progress Notes (Signed)
PROGRESS NOTE  Rodney Barnett NFA:[id number] DOB: Jun 26, 1941   PCP: [name], MD  Patient is from: Home.  DOA: 08/27/2021 LOS: 0  Chief complaints:  Chief Complaint  Patient presents with   Abnormal Lab     Brief Narrative / Interim history: 80 year old M with PMH of CAD/CABG in 1994, bladder cancer s/p TURBT and chemo in 2019, CKD-3A, chronic back pain, HTN and HLD directed to ED by his cardiologist for low hemoglobin.  Patient had a lab work for his upcoming heart catheterization on 08/29/2021, and his Hgb was 7.6 from 14.3 in 08/2020.  He had generalized weakness, fatigue, DOE for 1 week and melena for 2 weeks.  He is on Plavix and aspirin.  Also takes Goody powder occasionally.   On arrival to ED, he was HDS.  Hgb 7.5.  Hemoccult positive.  Transfused 1 unit.  GI consulted.  Subjective: Seen and examined earlier this morning.  No major events overnight of this morning.  No complaints.  He denies chest pain, palpitation, dizziness, dyspnea, nausea, vomiting or abdominal pain.  He denies UTI symptoms.  Has not had a bowel movement since yesterday afternoon.  Objective: Vitals:   08/28/21 0900 08/28/21 0930 08/28/21 1000 08/28/21 1030  BP: 137/75 112/68 120/64 102/61  Pulse: 81 73 80 63  Resp: 18 13 16  (!) 21  Temp:      TempSrc:      SpO2: 95% 96% 93% 98%    Intake/Output Summary (Last 24 hours) at 08/28/2021 1156 Last data filed at 08/28/2021 0538 Gross per 24 hour  Intake 434.21 ml  Output --  Net 434.21 ml   There were no vitals filed for this visit.  Examination:  GENERAL: No apparent distress.  Nontoxic. HEENT: MMM.  Vision and hearing grossly intact.  NECK: Supple.  No apparent JVD.  RESP: 98% on RA.  No IWOB.  Fair aeration bilaterally. CVS:  RRR. Heart sounds normal.  ABD/GI/GU: BS+. Abd soft, NTND.  MSK/EXT:  Moves extremities. No apparent deformity. No edema.  SKIN: no apparent skin lesion or wound NEURO: Awake, alert and oriented  appropriately.  No apparent focal neuro deficit. PSYCH: Calm. Normal affect.   Procedures:  None  Microbiology summarized: IONGE-95 and influenza PCR nonreactive.  Assessment & Plan: Symptomatic acute on chronic blood loss anemia likely due to upper GI bleed: Hgb 7.5 (14.3 in 08/2020).  Patient endorses generalized weakness, fatigue, DOE for 1 week and melena for 2 weeks.  On Plavix and aspirin.  Also reports using Goody powders occasionally.  BUN within normal favoring chronic bleed.  Transfused 1 unit.  Recent Labs    08/27/21 2210 08/28/21 0822  HGB 7.5* 8.1*  -Check anemia panel -Monitor H&H every 8 hours. -Continue IV Protonix 40 mg twice daily -Follow GI recommendations -Continue holding Plavix and aspirin -Advised against NSAID moving forward -Transfuse for Hgb less than 8.0 or if he is symptomatic again  History of CAD/CABG in 1994-seen by cardiologist yesterday for DOE which could be likely from anemia at this point.  He is on Plavix and aspirin.  -Hold Plavix and aspirin -Continue home Toprol, lisinopril and Crestor. -Discontinue IV fluid  CKD-3A: Cr slightly up.  Recent Labs    08/20/21 1341 08/27/21 2210 08/28/21 0822  BUN 24* 15 15  CREATININE 1.41 1.33* 1.47*  -Continue monitoring -Hold home lisinopril  Chronic back pain -Continue home Norco at reduced dose  Essential hypertension: Normotensive. -Continue home Imdur and metoprolol -Discontinue IV fluid -Hold  home lisinopril  History of bladder cancer s/p TURBT and chemo in 2019. -Outpatient follow-up   There is no height or weight on file to calculate BMI.         DVT prophylaxis:  SCDs Start: 08/28/21 0159  Code Status: Full code Family Communication: Patient and/or RN. Available if any question.  Level of care: Telemetry Medical Status is: Inpatient  Remains inpatient appropriate because: Evaluation and treatment of symptomatic acute on chronic blood loss anemia due to GI  bleed       Consultants:  Gastroenterology Cardiology   Sch Meds:  Scheduled Meds:  gabapentin  600 mg Oral TID   isosorbide mononitrate  30 mg Oral Daily   lisinopril  10 mg Oral QHS   metoprolol tartrate  25 mg Oral BID   [START ON 08/31/2021] pantoprazole  40 mg Intravenous Q12H   rosuvastatin  20 mg Oral QHS   sodium chloride flush  3 mL Intravenous Q12H   Continuous Infusions:  lactated ringers 75 mL/hr at 08/28/21 0549   pantoprazole 8 mg/hr (08/28/21 0309)   PRN Meds:.acetaminophen **OR** acetaminophen, HYDROcodone-acetaminophen, ondansetron **OR** ondansetron (ZOFRAN) IV, senna-docusate  Antimicrobials: Anti-infectives (From admission, onward)    None        I have personally reviewed the following labs and images: CBC: Recent Labs  Lab 08/27/21 2210 08/28/21 0822  WBC 6.3 4.2  NEUTROABS 3.8  --   HGB 7.5* 8.1*  HCT 26.2* 28.3*  MCV 87.9 88.2  PLT 213 178   BMP &GFR Recent Labs  Lab 08/27/21 2210 08/28/21 0822  NA 135 137  K 4.4 3.8  CL 101 103  CO2 26 28  GLUCOSE 129* 116*  BUN 15 15  CREATININE 1.33* 1.47*  CALCIUM 8.9 8.6*   Estimated Creatinine Clearance: 56.5 mL/min (A) (by C-G formula based on SCr of 1.47 mg/dL (H)). Liver & Pancreas: Recent Labs  Lab 08/27/21 2210  AST 14*  ALT 10  ALKPHOS 58  BILITOT 0.8  PROT 7.2  ALBUMIN 3.5   No results for input(s): LIPASE, AMYLASE in the last 168 hours. No results for input(s): AMMONIA in the last 168 hours. Diabetic: No results for input(s): HGBA1C in the last 72 hours. No results for input(s): GLUCAP in the last 168 hours. Cardiac Enzymes: No results for input(s): CKTOTAL, CKMB, CKMBINDEX, TROPONINI in the last 168 hours. No results for input(s): PROBNP in the last 8760 hours. Coagulation Profile: No results for input(s): INR, PROTIME in the last 168 hours. Thyroid Function Tests: No results for input(s): TSH, T4TOTAL, FREET4, T3FREE, THYROIDAB in the last 72 hours. Lipid  Profile: No results for input(s): CHOL, HDL, LDLCALC, TRIG, CHOLHDL, LDLDIRECT in the last 72 hours. Anemia Panel: No results for input(s): VITAMINB12, FOLATE, FERRITIN, TIBC, IRON, RETICCTPCT in the last 72 hours. Urine analysis:    Component Value Date/Time   COLORURINE yellow 05/14/2010 0900   APPEARANCEUR Clear 05/14/2010 0900   LABSPEC <1.005 05/14/2010 0900   PHURINE 6.0 05/14/2010 0900   HGBUR large 05/14/2010 0900   BILIRUBINUR negative 02/17/2021 1139   PROTEINUR Negative 02/17/2021 1139   UROBILINOGEN 0.2 02/17/2021 1139   UROBILINOGEN 0.2 05/14/2010 0900   NITRITE positive 02/17/2021 1139   NITRITE positive 05/14/2010 0900   LEUKOCYTESUR Large (3+) (A) 02/17/2021 1139   Sepsis Labs: Invalid input(s): PROCALCITONIN, [place]  Microbiology: Recent Results (from the past 240 hour(s))  Resp Panel by RT-PCR (Flu A&B, Covid) Nasopharyngeal Swab     Status: None   Collection  Time: 08/27/21  9:42 PM   Specimen: Nasopharyngeal Swab; Nasopharyngeal(NP) swabs in vial transport medium  Result Value Ref Range Status   SARS Coronavirus 2 by RT PCR NEGATIVE NEGATIVE Final    Comment: (NOTE) SARS-CoV-2 target nucleic acids are NOT DETECTED.  The SARS-CoV-2 RNA is generally detectable in upper respiratory specimens during the acute phase of infection. The lowest concentration of SARS-CoV-2 viral copies this assay can detect is 138 copies/mL. A negative result does not preclude SARS-Cov-2 infection and should not be used as the sole basis for treatment or other patient management decisions. A negative result may occur with  improper specimen collection/handling, submission of specimen other than nasopharyngeal swab, presence of viral mutation(s) within the areas targeted by this assay, and inadequate number of viral copies(<138 copies/mL). A negative result must be combined with clinical observations, patient history, and epidemiological information. The expected result is  Negative.  Fact Sheet for Patients:  EntrepreneurPulse.com.au  Fact Sheet for Healthcare Providers:  IncredibleEmployment.be  This test is no t yet approved or cleared by the [place] FDA and  has been authorized for detection and/or diagnosis of SARS-CoV-2 by FDA under an Emergency Use Authorization (EUA). This EUA will remain  in effect (meaning this test can be used) for the duration of the COVID-19 declaration under Section 564(b)(1) of the Act, 21 U.S.C.section 360bbb-3(b)(1), unless the authorization is terminated  or revoked sooner.       Influenza A by PCR NEGATIVE NEGATIVE Final   Influenza B by PCR NEGATIVE NEGATIVE Final    Comment: (NOTE) The Xpert Xpress SARS-CoV-2/FLU/RSV plus assay is intended as an aid in the diagnosis of influenza from Nasopharyngeal swab specimens and should not be used as a sole basis for treatment. Nasal washings and aspirates are unacceptable for Xpert Xpress SARS-CoV-2/FLU/RSV testing.  Fact Sheet for Patients: EntrepreneurPulse.com.au  Fact Sheet for Healthcare Providers: IncredibleEmployment.be  This test is not yet approved or cleared by the [place] FDA and has been authorized for detection and/or diagnosis of SARS-CoV-2 by FDA under an Emergency Use Authorization (EUA). This EUA will remain in effect (meaning this test can be used) for the duration of the COVID-19 declaration under Section 564(b)(1) of the Act, 21 U.S.C. section 360bbb-3(b)(1), unless the authorization is terminated or revoked.  Performed at [facility], [place] [address], [place], [place] [address]     Radiology Studies: DG Chest 2 View  Result Date: 08/27/2021 CLINICAL DATA:  Shortness of breath, weakness EXAM: CHEST - 2 VIEW.  Patient is slightly rotated on the frontal view. COMPARISON:  Chest x-ray 09/06/2009 FINDINGS: The heart and mediastinal contours are unchanged.  Cardiac surgical changes overlie the mediastinum. Left base atelectasis. Slightly increased interstitial and airspace markings within the left lower lobe. No focal consolidation. Chronic coarsened interstitial markings with no overt pulmonary edema. No pleural effusion. No pneumothorax. No acute osseous abnormality. IMPRESSION: Chronic bronchitic changes with query superimposed retrocardiac infection/inflammation. Electronically Signed   By: Iven Finn M.D.   On: 08/27/2021 22:21     Additional 35 minutes with more than 50% spent in reviewing records, counseling patient/family and coordinating care.   Tiffanyann Deroo T. Franklin  If 7PM-7AM, please contact night-coverage [email or web address] 08/28/2021, 11:56 AM

## 2021-08-28 NOTE — Consult Note (Addendum)
Referring Provider: [facility] ED team, Dr. Harrold Donath Primary Care Physician:  [name], MD Primary Gastroenterologist:  Dr. Deatra Ina, last seen 2008  Reason for Consultation:  Anemia   IMPRESSION:  Symptomatic anemia with recent melena and hemoccult + stools Demand ischemia due to anemia with elevated troponin Chronic, intermittent reflux treated with TUMS PRN Frequent NSAIDs CAD with distant CABG on Plavix and ASA History of tubular adenoma on colonoscopy 2003  Endoscopic evaluation recommended for evaluation of GI blood loss anemia. He is HIGH RISK for endoscopy given his advanced age, comorbidities, and chronic antiplatelet therapy. Cardiology does not feel endoscopy needs to be delayed in the setting of demand ischemia.   PLAN: - Regular diet today, make NPO with evidence for overt bleeding - Empiric PPI IV BID - Hold Plavix, OK to continue ASA - Serial hgb/hct with transfusion as indicated - Consider evaluation for concurrent causes of anemia - EGD and colonoscopy after a 5 day Plavix washout  We will see him again over the weekend. Please call with any questions or concerns prior to that time.    HPI: Rodney Barnett is a 80 y.o. male seen in consultation at the request of the [facility] ED for further evaluation of anemia. The history is obtained through the patient and review of his electronic health record.   He has a history of hypertension, CAD with distant CABG on Plavix and aspirin, hyperlipidemia, stable chronic kidney disease and a history of bladder cancer. He was admitted with anemia identified on his pre-procedure cardiac catheterization labs with a hemoglobin of 7.6. hemoglobin was 14.3 08/2020. Catheterization planned to evaluate 2 weeks of progressive fatigue and exertional dyspnea. He received one until PRBCs on admission.   He has had intermittent, dark, hard stools over the last few weeks without abdominal pain, diarrhea, or constipation. No  overt GI blood loss.  No epistaxis, hemoptysis, or hematuria.   He has intermittent reflux that he treats with TUMS. No change in reflux, odynophagia or dysphagia.   He intermittently uses Goody powder. Denies the use of other NSAIDs.  Prior colonoscopy with Dr. Velora Heckler 2008 showed diverticulosis and a small hyperplastic polyp. Colonoscopy 2003 was a poor prep but revealed diverticulosis and small cecal adenoma.  He had requested a surveillance colonoscopy with his PCP in 2020 but this was never scheduled.   No prior upper endoscopy.  Admission labs show: BUN 15, crt 1.47 Albumin 3.5 Hemolgobin 7.5 MCV 87.9, RDW 16.3 Platelets 213 Troponin I 47 -> 36 -> 31 Hemoccult positive Normal liver enzymes last week Hemoglobin was 14.3 08/20/20 No recent abdominal imaging  Cardiology feels his cardiac enzymes are demand ischemia from his anemia. They have recommended holding the Plavix and do not feel endoscopy needs to be delayed.   Past Medical History:  Diagnosis Date   Chronic back pain    Chronic rhinitis    Coronary atherosclerosis of native coronary artery    Multivessel, LVEF 55-60%, basal inferior hypokinesis, known graft disease   Erectile dysfunction    Essential hypertension    Insomnia    Lipoma    Mixed hyperlipidemia    Peripheral neuropathy     Past Surgical History:  Procedure Laterality Date   CORONARY ARTERY BYPASS GRAFT  1994   LIMA to LAD, SVG to RCA, SVG to diagonal   TONSILLECTOMY     TRANSURETHRAL RESECTION OF BLADDER TUMOR N/A 09/09/2018   Procedure: TRANSURETHRAL RESECTION OF BLADDER TUMOR (TURBT) WITH INSTILLATION OF  POST OPERATIVE CHEMOTHERAPY;  Surgeon: Kathie Rhodes, MD;  Location: [facility];  Service: Urology;  Laterality: N/A;    Prior to Admission medications   Medication Sig Start Date End Date Taking? Authorizing Provider  aspirin 81 MG tablet Take 81 mg by mouth daily.   Yes [provider]  clopidogrel (PLAVIX) 75 MG tablet TAKE 1  TABLET ONCE DAILY. Patient taking differently: Take 75 mg by mouth daily. 08/18/21  Yes Burchette, Alinda Sierras, MD  gabapentin (NEURONTIN) 300 MG capsule TAKE (2) CAPSULES BY MOUTH THREE TIMES DAILY. Patient taking differently: Take 600 mg by mouth 3 (three) times daily. 08/18/21  Yes Burchette, Alinda Sierras, MD  HYDROcodone-acetaminophen (NORCO) 10-325 MG tablet TAKE (1) TABLET BY MOUTH EVERY SIX HOURS AS NEEDED Patient taking differently: Take 0.5 tablets by mouth every 6 (six) hours as needed for moderate pain. 08/20/21  Yes Burchette, Alinda Sierras, MD  isosorbide mononitrate (IMDUR) 30 MG 24 hr tablet TAKE 1 TABLET ONCE DAILY. Patient taking differently: 30 mg daily. 08/18/21  Yes Burchette, Alinda Sierras, MD  lisinopril (ZESTRIL) 10 MG tablet TAKE 1 TABLET ONCE DAILY. Patient taking differently: Take 10 mg by mouth at bedtime. 08/18/21  Yes Burchette, Alinda Sierras, MD  metoprolol tartrate (LOPRESSOR) 25 MG tablet TAKE (1) TABLET TWICE DAILY. Patient taking differently: Take 25 mg by mouth 2 (two) times daily. 08/18/21  Yes Burchette, Alinda Sierras, MD  nitroGLYCERIN (NITROSTAT) 0.4 MG SL tablet Place 1 tablet (0.4 mg total) under the tongue every 5 (five) minutes x 3 doses as needed for chest pain (If no relief after 3rd dose, proceed to the ED for an evaluation or call 911). 08/20/21  Yes Burchette, Alinda Sierras, MD  Omega-3 Fatty Acids (FISH OIL) 1000 MG CPDR Take 1,000 mg by mouth daily.   Yes [provider]  rosuvastatin (CRESTOR) 20 MG tablet TAKE 1 TABLET ONCE DAILY. Patient taking differently: Take 20 mg by mouth at bedtime. 08/18/21  Yes Burchette, Alinda Sierras, MD    Current Facility-Administered Medications  Medication Dose Route Frequency Provider Last Rate Last Admin   acetaminophen (TYLENOL) tablet 650 mg  650 mg Oral Q6H PRN Chotiner, Yevonne Aline, MD       Or   acetaminophen (TYLENOL) suppository 650 mg  650 mg Rectal Q6H PRN Chotiner, Yevonne Aline, MD       gabapentin (NEURONTIN) capsule 600 mg  600 mg Oral  TID Chotiner, Yevonne Aline, MD   600 mg at 08/28/21 0914   HYDROcodone-acetaminophen (NORCO) 10-325 MG per tablet 0.5 tablet  0.5 tablet Oral Q6H PRN Chotiner, Yevonne Aline, MD       isosorbide mononitrate (IMDUR) 24 hr tablet 30 mg  30 mg Oral Daily Chotiner, Yevonne Aline, MD   30 mg at 08/28/21 1610   lactated ringers infusion   Intravenous Continuous Chotiner, Yevonne Aline, MD 75 mL/hr at 08/28/21 0549 New Bag at 08/28/21 0549   lisinopril (ZESTRIL) tablet 10 mg  10 mg Oral QHS Chotiner, Yevonne Aline, MD       metoprolol tartrate (LOPRESSOR) tablet 25 mg  25 mg Oral BID Chotiner, Yevonne Aline, MD   25 mg at 08/28/21 0914   ondansetron (ZOFRAN) tablet 4 mg  4 mg Oral Q6H PRN Chotiner, Yevonne Aline, MD       Or   ondansetron (ZOFRAN) injection 4 mg  4 mg Intravenous Q6H PRN Chotiner, Yevonne Aline, MD       [START ON 08/31/2021] pantoprazole (PROTONIX) injection 40 mg  40 mg  Intravenous Q12H Quincy Carnes M, PA-C       pantoprozole (PROTONIX) 80 mg /NS 100 mL infusion  8 mg/hr Intravenous Continuous Chotiner, Yevonne Aline, MD 10 mL/hr at 08/28/21 0309 8 mg/hr at 08/28/21 0309   rosuvastatin (CRESTOR) tablet 20 mg  20 mg Oral QHS Chotiner, Yevonne Aline, MD   20 mg at 08/28/21 0214   senna-docusate (Senokot-S) tablet 1 tablet  1 tablet Oral QHS PRN Chotiner, Yevonne Aline, MD       sodium chloride flush (NS) 0.9 % injection 3 mL  3 mL Intravenous Q12H Satira Sark, MD       Current Outpatient Medications  Medication Sig Dispense Refill   aspirin 81 MG tablet Take 81 mg by mouth daily.     clopidogrel (PLAVIX) 75 MG tablet TAKE 1 TABLET ONCE DAILY. (Patient taking differently: Take 75 mg by mouth daily.) 30 tablet 0   gabapentin (NEURONTIN) 300 MG capsule TAKE (2) CAPSULES BY MOUTH THREE TIMES DAILY. (Patient taking differently: Take 600 mg by mouth 3 (three) times daily.) 180 capsule 0   HYDROcodone-acetaminophen (NORCO) 10-325 MG tablet TAKE (1) TABLET BY MOUTH EVERY SIX HOURS AS NEEDED (Patient taking differently: Take 0.5  tablets by mouth every 6 (six) hours as needed for moderate pain.) 90 tablet 0   isosorbide mononitrate (IMDUR) 30 MG 24 hr tablet TAKE 1 TABLET ONCE DAILY. (Patient taking differently: 30 mg daily.) 30 tablet 0   lisinopril (ZESTRIL) 10 MG tablet TAKE 1 TABLET ONCE DAILY. (Patient taking differently: Take 10 mg by mouth at bedtime.) 30 tablet 0   metoprolol tartrate (LOPRESSOR) 25 MG tablet TAKE (1) TABLET TWICE DAILY. (Patient taking differently: Take 25 mg by mouth 2 (two) times daily.) 60 tablet 0   nitroGLYCERIN (NITROSTAT) 0.4 MG SL tablet Place 1 tablet (0.4 mg total) under the tongue every 5 (five) minutes x 3 doses as needed for chest pain (If no relief after 3rd dose, proceed to the ED for an evaluation or call 911). 25 tablet 0   Omega-3 Fatty Acids (FISH OIL) 1000 MG CPDR Take 1,000 mg by mouth daily.     rosuvastatin (CRESTOR) 20 MG tablet TAKE 1 TABLET ONCE DAILY. (Patient taking differently: Take 20 mg by mouth at bedtime.) 30 tablet 0    Allergies as of 08/27/2021 - Review Complete 08/27/2021  Allergen Reaction Noted   Lipitor [atorvastatin] Other (See Comments) 06/07/2013    Family History  Problem Relation Age of Onset   Hypertension Other    Cancer Brother        renal cancer    Social History   Socioeconomic History   Marital status: Widowed    Spouse name: Not on file   Number of children: Not on file   Years of education: Not on file   Highest education level: Not on file  Occupational History   Not on file  Tobacco Use   Smoking status: Former    Packs/day: 3.00    Years: 40.00    Pack years: 120.00    Types: Cigarettes    Start date: 10/05/1954    Quit date: 01/06/1993    Years since quitting: 28.6   Smokeless tobacco: Never  Vaping Use   Vaping Use: Never used  Substance and Sexual Activity   Alcohol use: No    Alcohol/week: 0.0 standard drinks   Drug use: No   Sexual activity: Not on file  Other Topics Concern   Not on file  Social History  Narrative   Not on file   Social Determinants of Health   Financial Resource Strain: Low Risk    Difficulty of Paying Living Expenses: Not hard at all  Food Insecurity: No Food Insecurity   Worried About Charity fundraiser in the Last Year: Never true   [place] in the Last Year: Never true  Transportation Needs: No Transportation Needs   Lack of Transportation (Medical): No   Lack of Transportation (Non-Medical): No  Physical Activity: Insufficiently Active   Days of Exercise per Week: 3 days   Minutes of Exercise per Session: 30 min  Stress: No Stress Concern Present   Feeling of Stress : Not at all  Social Connections: Socially Isolated   Frequency of Communication with Friends and Family: Three times a week   Frequency of Social Gatherings with Friends and Family: Three times a week   Attends Religious Services: Never   Active Member of Clubs or Organizations: No   Attends Archivist Meetings: Never   Marital Status: Widowed  Human resources officer Violence: Not At Risk   Fear of Current or Ex-Partner: No   Emotionally Abused: No   Physically Abused: No   Sexually Abused: No    Review of Systems: 12 system ROS is negative except as noted above.   Physical Exam: Temp:  [97.4 F (36.3 C)-98.2 F (36.8 C)] 97.6 F (36.4 C) (11/24 0538) Pulse Rate:  [63-87] 63 (11/24 1030) Resp:  [10-22] 21 (11/24 1030) BP: (102-137)/(60-79) 102/61 (11/24 1030) SpO2:  [93 %-100 %] 98 % (11/24 1030) Weight:  [114.7 kg] 114.7 kg (11/23 1513)   General:   Alert,  well-nourished, pleasant and cooperative in NAD Head:  Normocephalic and atraumatic. Eyes:  Sclera clear, no icterus.   Conjunctiva pink. Ears:  Normal auditory acuity. Nose:  No deformity, discharge,  or lesions. Mouth:  No deformity or lesions.   Neck:  Supple; no masses or thyromegaly. Lungs:  Clear throughout to auscultation.   No wheezes. Heart:  Regular rate and rhythm; no murmurs. Abdomen:   Soft,nontender, nondistended, normal bowel sounds, no rebound or guarding. No hepatosplenomegaly.   Rectal:  Deferred  Msk:  Symmetrical. No boney deformities LAD: No inguinal or umbilical LAD Extremities:  No clubbing or edema. Neurologic:  Alert and  oriented x4;  grossly nonfocal Skin:  Intact without significant lesions or rashes. Psych:  Alert and cooperative. Normal mood and affect.   Lab Results: Recent Labs    08/27/21 2210 08/28/21 0822  WBC 6.3 4.2  HGB 7.5* 8.1*  HCT 26.2* 28.3*  PLT 213 178   BMET Recent Labs    08/27/21 2210 08/28/21 0822  NA 135 137  K 4.4 3.8  CL 101 103  CO2 26 28  GLUCOSE 129* 116*  BUN 15 15  CREATININE 1.33* 1.47*  CALCIUM 8.9 8.6*   LFT Recent Labs    08/27/21 2210  PROT 7.2  ALBUMIN 3.5  AST 14*  ALT 10  ALKPHOS 58  BILITOT 0.8      Studies/Results: DG Chest 2 View  Result Date: 08/27/2021 CLINICAL DATA:  Shortness of breath, weakness EXAM: CHEST - 2 VIEW.  Patient is slightly rotated on the frontal view. COMPARISON:  Chest x-ray 09/06/2009 FINDINGS: The heart and mediastinal contours are unchanged. Cardiac surgical changes overlie the mediastinum. Left base atelectasis. Slightly increased interstitial and airspace markings within the left lower lobe. No focal consolidation. Chronic coarsened interstitial markings with no overt pulmonary edema.  No pleural effusion. No pneumothorax. No acute osseous abnormality. IMPRESSION: Chronic bronchitic changes with query superimposed retrocardiac infection/inflammation. Electronically Signed   By: Iven Finn M.D.   On: 08/27/2021 22:21      [place]. Tarri Glenn, MD, MPH 08/28/2021, 12:06 PM

## 2021-08-28 NOTE — Consult Note (Signed)
Cardiology Consultation:  Patient ID: Rodney Barnett MRN: [id number]; DOB: [birth year]  Admit date: 08/27/2021 Date of Consult: 08/28/2021  Primary Care Provider: [name], MD Primary Cardiologist: Rozann Lesches, MD  Primary Electrophysiologist:  None   Patient Profile:  Rodney Barnett is a 80 y.o. male with a hx of CAD status post CABG, hypertension, hyperlipidemia who is being seen today for the evaluation of CAD at the request of Wendee Beavers, MD.  History of Present Illness:  Rodney Barnett reports 2 weeks of worsening shortness of breath with exertion.  Prior to this he was walking up to 1 mile on a treadmill without limitations.  He has had no chest pain or chest pressure.  He has done well since his left heart catheterization in 2010.  He reports he may have noticed some dark stool but no overt bleeding.  He reports it would come and go and he did not pay much attention to it.  Rodney Barnett was seen in our office yesterday by Dr. Johnny Bridge.  He was complaining of 2 weeks of exertional shortness of breath.  He denied any chest pain or pressure in his chest.  He been taking his medications.  Medical history significant for CABG in 1994.  Left heart catheterization in 2010 demonstrated only his LIMA to LAD is patent.  He had mild disease in the circumflex and occluded RCA that fills with collateral flow.  Vein graft to a diagonal branch as well as RCA were occluded.  He seems to have done well without symptoms of angina since that time.  LDL cholesterol is well controlled.  Laboratory data showed that his hemoglobin was 7.5.  This is down from 14.3 in 2021.  He was sent to the emergency room.  There was concern for EKG changes during that stay.  They appear to be nonspecific, reviewed.  Laboratory data also notable for creatinine 1.47.  This appears to be stable CKD stage III.  Cardiac enzymes initially 47 and 44 on repeat.  Chest x-ray shows changes consistent with chronic bronchitis.  At the  time my examination he denies any symptoms of chest pain or trouble breathing.  He is resting comfortably in the room and is hemodynamically stable.  He is receiving blood transfusions during our exam.  Heart Pathway Score:       Past Medical History: Past Medical History:  Diagnosis Date   Chronic back pain    Chronic rhinitis    Coronary atherosclerosis of native coronary artery    Multivessel, LVEF 55-60%, basal inferior hypokinesis, known graft disease   Erectile dysfunction    Essential hypertension    Insomnia    Lipoma    Mixed hyperlipidemia    Peripheral neuropathy     Past Surgical History: Past Surgical History:  Procedure Laterality Date   CORONARY ARTERY BYPASS GRAFT  1994   LIMA to LAD, SVG to RCA, SVG to diagonal   TONSILLECTOMY     TRANSURETHRAL RESECTION OF BLADDER TUMOR N/A 09/09/2018   Procedure: TRANSURETHRAL RESECTION OF BLADDER TUMOR (TURBT) WITH INSTILLATION OF POST OPERATIVE CHEMOTHERAPY;  Surgeon: Kathie Rhodes, MD;  Location: [facility];  Service: Urology;  Laterality: N/A;     Home Medications:  Prior to Admission medications   Medication Sig Start Date End Date Taking? Authorizing Provider  aspirin 81 MG tablet Take 81 mg by mouth daily.   Yes [provider]  clopidogrel (PLAVIX) 75 MG tablet TAKE 1 TABLET ONCE DAILY. Patient taking differently:  Take 75 mg by mouth daily. 08/18/21  Yes Burchette, Alinda Sierras, MD  gabapentin (NEURONTIN) 300 MG capsule TAKE (2) CAPSULES BY MOUTH THREE TIMES DAILY. Patient taking differently: Take 600 mg by mouth 3 (three) times daily. 08/18/21  Yes Burchette, Alinda Sierras, MD  HYDROcodone-acetaminophen (NORCO) 10-325 MG tablet TAKE (1) TABLET BY MOUTH EVERY SIX HOURS AS NEEDED Patient taking differently: Take 0.5 tablets by mouth every 6 (six) hours as needed for moderate pain. 08/20/21  Yes Burchette, Alinda Sierras, MD  isosorbide mononitrate (IMDUR) 30 MG 24 hr tablet TAKE 1 TABLET ONCE DAILY. Patient taking differently: 30  mg daily. 08/18/21  Yes Burchette, Alinda Sierras, MD  lisinopril (ZESTRIL) 10 MG tablet TAKE 1 TABLET ONCE DAILY. Patient taking differently: Take 10 mg by mouth at bedtime. 08/18/21  Yes Burchette, Alinda Sierras, MD  metoprolol tartrate (LOPRESSOR) 25 MG tablet TAKE (1) TABLET TWICE DAILY. Patient taking differently: Take 25 mg by mouth 2 (two) times daily. 08/18/21  Yes Burchette, Alinda Sierras, MD  nitroGLYCERIN (NITROSTAT) 0.4 MG SL tablet Place 1 tablet (0.4 mg total) under the tongue every 5 (five) minutes x 3 doses as needed for chest pain (If no relief after 3rd dose, proceed to the ED for an evaluation or call 911). 08/20/21  Yes Burchette, Alinda Sierras, MD  Omega-3 Fatty Acids (FISH OIL) 1000 MG CPDR Take 1,000 mg by mouth daily.   Yes [provider]  rosuvastatin (CRESTOR) 20 MG tablet TAKE 1 TABLET ONCE DAILY. Patient taking differently: Take 20 mg by mouth at bedtime. 08/18/21  Yes Burchette, Alinda Sierras, MD    Inpatient Medications: Scheduled Meds:  gabapentin  600 mg Oral TID   isosorbide mononitrate  30 mg Oral Daily   lisinopril  10 mg Oral QHS   metoprolol tartrate  25 mg Oral BID   [START ON 08/31/2021] pantoprazole  40 mg Intravenous Q12H   rosuvastatin  20 mg Oral QHS   sodium chloride flush  3 mL Intravenous Q12H   Continuous Infusions:  lactated ringers 75 mL/hr at 08/28/21 0549   pantoprazole 8 mg/hr (08/28/21 0309)   PRN Meds: acetaminophen **OR** acetaminophen, HYDROcodone-acetaminophen, ondansetron **OR** ondansetron (ZOFRAN) IV, senna-docusate  Allergies:    Allergies  Allergen Reactions   Lipitor [Atorvastatin] Other (See Comments)    myalgia    Social History:   Social History   Socioeconomic History   Marital status: Widowed    Spouse name: Not on file   Number of children: Not on file   Years of education: Not on file   Highest education level: Not on file  Occupational History   Not on file  Tobacco Use   Smoking status: Former    Packs/day: 3.00     Years: 40.00    Pack years: 120.00    Types: Cigarettes    Start date: 10/05/1954    Quit date: 01/06/1993    Years since quitting: 28.6   Smokeless tobacco: Never  Vaping Use   Vaping Use: Never used  Substance and Sexual Activity   Alcohol use: No    Alcohol/week: 0.0 standard drinks   Drug use: No   Sexual activity: Not on file  Other Topics Concern   Not on file  Social History Narrative   Not on file   Social Determinants of Health   Financial Resource Strain: Low Risk    Difficulty of Paying Living Expenses: Not hard at all  Food Insecurity: No Food Insecurity   Worried About Running Out  of Food in the Last Year: Never true   [place] in the Last Year: Never true  Transportation Needs: No Transportation Needs   Lack of Transportation (Medical): No   Lack of Transportation (Non-Medical): No  Physical Activity: Insufficiently Active   Days of Exercise per Week: 3 days   Minutes of Exercise per Session: 30 min  Stress: No Stress Concern Present   Feeling of Stress : Not at all  Social Connections: Socially Isolated   Frequency of Communication with Friends and Family: Three times a week   Frequency of Social Gatherings with Friends and Family: Three times a week   Attends Religious Services: Never   Active Member of Clubs or Organizations: No   Attends Archivist Meetings: Never   Marital Status: Widowed  Human resources officer Violence: Not At Risk   Fear of Current or Ex-Partner: No   Emotionally Abused: No   Physically Abused: No   Sexually Abused: No     Family History:    Family History  Problem Relation Age of Onset   Hypertension Other    Cancer Brother        renal cancer     ROS:  All other ROS reviewed and negative. Pertinent positives noted in the HPI.     Physical Exam/Data:   Vitals:   08/28/21 0538 08/28/21 0900 08/28/21 0930 08/28/21 1000  BP: 116/68 137/75 112/68 120/64  Pulse: 81 81 73 80  Resp: 13 18 13 16   Temp: 97.6 F  (36.4 C)     TempSrc: Oral     SpO2: 100% 95% 96% 93%    Intake/Output Summary (Last 24 hours) at 08/28/2021 1138 Last data filed at 08/28/2021 0538 Gross per 24 hour  Intake 434.21 ml  Output --  Net 434.21 ml    Last 3 Weights 08/27/2021 08/20/2021 06/27/2021  Weight (lbs) 252 lb 12.8 oz 251 lb 8 oz (No Data)  Weight (kg) 114.669 kg 114.08 kg (No Data)    There is no height or weight on file to calculate BMI.  General: Well nourished, well developed, in no acute distress Head: Atraumatic, normal size  Eyes: PEERLA, EOMI  Neck: Supple, no JVD Endocrine: No thryomegaly Cardiac: Normal S1, S2; RRR; 3 out of 6 systolic ejection murmur Lungs: Clear to auscultation bilaterally, no wheezing, rhonchi or rales  Abd: Soft, nontender, no hepatomegaly  Ext: No edema, pulses 2+ Musculoskeletal: No deformities, BUE and BLE strength normal and equal Skin: Warm and dry, no rashes   Neuro: Alert and oriented to person, place, time, and situation, CNII-XII grossly intact, no focal deficits  Psych: Normal mood and affect   EKG:  The EKG was personally reviewed and demonstrates: Normal sinus rhythm heart rate 86, nonspecific ST-T changes Telemetry:  Telemetry was personally reviewed and demonstrates: Sinus rhythm in the 70s  Relevant CV Studies: Left heart catheterization 09/09/2009 Left main 30% Left circumflex 40% LAD: 100% proximal RCA: 100% proximal, distal RCA fills by collaterals Patent LIMA to LAD Occluded SVG to RCA, occluded vein graft to diagonal  Laboratory Data: High Sensitivity Troponin:   Recent Labs  Lab 08/27/21 2210 08/27/21 2340 08/28/21 0822  TROPONINIHS 47* 44* 36*     Cardiac EnzymesNo results for input(s): TROPONINI in the last 168 hours. No results for input(s): TROPIPOC in the last 168 hours.  Chemistry Recent Labs  Lab 08/27/21 2210 08/28/21 0822  NA 135 137  K 4.4 3.8  CL 101 103  CO2  26 28  GLUCOSE 129* 116*  BUN 15 15  CREATININE 1.33* 1.47*   CALCIUM 8.9 8.6*  GFRNONAA 54* 48*  ANIONGAP 8 6    Recent Labs  Lab 08/27/21 2210  PROT 7.2  ALBUMIN 3.5  AST 14*  ALT 10  ALKPHOS 58  BILITOT 0.8   Hematology Recent Labs  Lab 08/27/21 2210 08/28/21 0822  WBC 6.3 4.2  RBC 2.98* 3.21*  HGB 7.5* 8.1*  HCT 26.2* 28.3*  MCV 87.9 88.2  MCH 25.2* 25.2*  MCHC 28.6* 28.6*  RDW 16.3* 16.0*  PLT 213 178   BNPNo results for input(s): BNP, PROBNP in the last 168 hours.  DDimer No results for input(s): DDIMER in the last 168 hours.  Radiology/Studies:  DG Chest 2 View  Result Date: 08/27/2021 CLINICAL DATA:  Shortness of breath, weakness EXAM: CHEST - 2 VIEW.  Patient is slightly rotated on the frontal view. COMPARISON:  Chest x-ray 09/06/2009 FINDINGS: The heart and mediastinal contours are unchanged. Cardiac surgical changes overlie the mediastinum. Left base atelectasis. Slightly increased interstitial and airspace markings within the left lower lobe. No focal consolidation. Chronic coarsened interstitial markings with no overt pulmonary edema. No pleural effusion. No pneumothorax. No acute osseous abnormality. IMPRESSION: Chronic bronchitic changes with query superimposed retrocardiac infection/inflammation. Electronically Signed   By: Iven Finn M.D.   On: 08/27/2021 22:21    Assessment and Plan:   #Exertional SOB #Anemia/GI bleed suspected #Elevated troponin, demand in the setting of acute blood loss anemia (no chest pain) #Murmur -Known history of CAD status post CABG.  Left heart catheterization in 2010 demonstrated an occluded RCA with collateral flow.  He also had an occluded LAD with patent LIMA to LAD.  He had mild disease in the circumflex.  His vein graft to the RCA is occluded.  Vein graft to diagonal branch is occluded.  He had normal LV function in 2010.  No recent evaluation. -Now with shortness of breath for 2 weeks.  This is coincided with a drop in his hemoglobin value to 7.5.  This was normal in November  of last year. -I believe this explains his symptoms.  He has not reported any symptoms concerning for angina.  He reports no chest pain or pressure.  He actually was walking up to 1 mile on a treadmill prior to the past 2 weeks.  His EKG demonstrates nonspecific ST-T changes which do not represent an acute coronary syndrome.  Nor to his symptoms represent this. -Cardiac enzymes are minimally elevated and flat.  This is all due to demand in the setting of anemia.  He has an occluded RCA with collateral flow.  He also has native disease.  It is not surprising that his troponin values are minimally elevated. -Would recommend to hold aspirin and Plavix.  We will hold antihypertensive agents in the setting of acute GI bleed.  This can be added back as he is able to tolerate them. -Okay to continue with statin for now as well. -He does have a noticeable murmur on exam.  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  His primary cardiologist had planned for an outpatient echocardiogram.  I have ordered this and we can do this tomorrow.  Again this will not change anything.  He was quite active before his change in symptoms 2 weeks ago.  Given the association of aortic stenosis with GI bleed I think it is worthwhile to go ahead and have this performed while he is in-house.  His echocardiogram should not delay his EGD and colonoscopy as planned by GI. -Cardiology will follow remotely while in house.  Please notify us of any changes.  Would recommend to restart home medications as you are able.  Clearly GI will guide [place] with antiplatelet agents.  For questions or updates, please contact [place] Please consult [email or web address] for contact info under   Signed, [place] B[place] T. Audie Box, MD, [place]  08/28/2021 11:38 AM

## 2021-08-28 NOTE — H&P (Signed)
History and Physical    Rodney Barnett GHW:[id number] DOB: 03/05/1941 DOA: 08/27/2021  PCP: [name], MD   Patient coming from: Home  Chief Complaint: Dentalized fatigue, abnormal labs  HPI: Rodney Barnett is a 80 y.o. male with medical history significant for hypertension, CAD, HLD, history of bladder cancer and abnormal labs.  He is being scheduled for a cardiac catheterization this coming Friday and had lab work done.  He was called today and told that his hemoglobin was low and needed to come to the emergency room for evaluation.  He reports having generalized weakness and feeling fatigued with shortness of breath with exertion over the last few weeks.  He states he has not had any chest pain or pressure.  He has not had any syncope or near syncope.  He states his stools have been very black and tarry for the last week or 2 which is not normal for him.  He denies any abdominal pain, nausea vomiting or diarrhea.  States he has never had GI bleeding in the past.  Reports he had a colonoscopy in 2009 with low [name] remember the results.  He does take aspirin and Plavix due to his CAD.  Denies tobacco, alcohol, illicit drug use  ED Course: In the emergency room is found to have a hemoglobin of 7.5.  He has been hemodynamically stable.  He is Hemoccult positive in the emergency room.  He has mildly elevated troponin levels with no EKG changes and no symptoms of chest pain or pressure.  He started on Protonix infusion.  GI has been consulted.  Electrolytes are normal.  Kidney function is at baseline level.  We will transfuse 1 unit of PRBC with goal hemoglobin of 8 with cardiovascular disease.  Gastroenterology was consulted by the ER provider to see the patient in the morning.  [facility] service been asked admit for further management  Review of Systems:  General: Denies fever, chills, weight loss, night sweats.  Denies dizziness.  Denies change in appetite HENT: Denies head trauma,  headache, denies change in hearing, tinnitus.  Denies nasal congestion or bleeding.  Denies sore throat, sores in mouth.  Denies difficulty swallowing Eyes: Denies blurry vision, pain in eye, drainage.  Denies discoloration of eyes. Neck: Denies pain.  Denies swelling.  Denies pain with movement. Cardiovascular: Denies chest pain, palpitations.  Denies edema.  Denies orthopnea Respiratory: Denies shortness of breath, cough.  Denies wheezing.  Denies sputum production Gastrointestinal: Denies abdominal pain, swelling.  Denies nausea, vomiting, diarrhea. Reports melena.  Denies hematemesis. Musculoskeletal: Denies limitation of movement.  Denies deformity or swelling. Denies arthralgias or myalgias. Genitourinary: Denies pelvic pain.  Denies urinary frequency or hesitancy.  Denies dysuria.  Skin: Denies rash.  Denies petechiae, purpura, ecchymosis. Neurological: Denies headache.  Denies syncope.  Denies seizure activity. Denies  paresthesia.  Denies slurred speech, drooping face.  Denies visual change. Psychiatric: Denies depression, anxiety.  Denies hallucinations.  Past Medical History:  Diagnosis Date   Chronic back pain    Chronic rhinitis    Coronary atherosclerosis of native coronary artery    Multivessel, LVEF 55-60%, basal inferior hypokinesis, known graft disease   Erectile dysfunction    Essential hypertension    Insomnia    Lipoma    Mixed hyperlipidemia    Peripheral neuropathy     Past Surgical History:  Procedure Laterality Date   CORONARY ARTERY BYPASS GRAFT  1994   LIMA to LAD, SVG to RCA, SVG to diagonal  TONSILLECTOMY     TRANSURETHRAL RESECTION OF BLADDER TUMOR N/A 09/09/2018   Procedure: TRANSURETHRAL RESECTION OF BLADDER TUMOR (TURBT) WITH INSTILLATION OF POST OPERATIVE CHEMOTHERAPY;  Surgeon: Kathie Rhodes, MD;  Location: [facility];  Service: Urology;  Laterality: N/A;    Social History  reports that he quit smoking about 28 years ago. His smoking use included  cigarettes. He started smoking about 66 years ago. He has a 120.00 pack-year smoking history. He has never used smokeless tobacco. He reports that he does not drink alcohol and does not use drugs.  Allergies  Allergen Reactions   Lipitor [Atorvastatin] Other (See Comments)    myalgia    Family History  Problem Relation Age of Onset   Hypertension Other    Cancer Brother        renal cancer     Prior to Admission medications   Medication Sig Start Date End Date Taking? Authorizing Provider  aspirin 81 MG tablet Take 81 mg by mouth daily.   Yes [provider]  clopidogrel (PLAVIX) 75 MG tablet TAKE 1 TABLET ONCE DAILY. Patient taking differently: Take 75 mg by mouth daily. 08/18/21  Yes Burchette, Alinda Sierras, MD  gabapentin (NEURONTIN) 300 MG capsule TAKE (2) CAPSULES BY MOUTH THREE TIMES DAILY. Patient taking differently: Take 600 mg by mouth 3 (three) times daily. 08/18/21  Yes Burchette, Alinda Sierras, MD  HYDROcodone-acetaminophen (NORCO) 10-325 MG tablet TAKE (1) TABLET BY MOUTH EVERY SIX HOURS AS NEEDED Patient taking differently: Take 0.5 tablets by mouth every 6 (six) hours as needed for moderate pain. 08/20/21  Yes Burchette, Alinda Sierras, MD  isosorbide mononitrate (IMDUR) 30 MG 24 hr tablet TAKE 1 TABLET ONCE DAILY. Patient taking differently: 30 mg daily. 08/18/21  Yes Burchette, Alinda Sierras, MD  lisinopril (ZESTRIL) 10 MG tablet TAKE 1 TABLET ONCE DAILY. Patient taking differently: Take 10 mg by mouth at bedtime. 08/18/21  Yes Burchette, Alinda Sierras, MD  metoprolol tartrate (LOPRESSOR) 25 MG tablet TAKE (1) TABLET TWICE DAILY. Patient taking differently: Take 25 mg by mouth 2 (two) times daily. 08/18/21  Yes Burchette, Alinda Sierras, MD  nitroGLYCERIN (NITROSTAT) 0.4 MG SL tablet Place 1 tablet (0.4 mg total) under the tongue every 5 (five) minutes x 3 doses as needed for chest pain (If no relief after 3rd dose, proceed to the ED for an evaluation or call 911). 08/20/21  Yes Burchette, Alinda Sierras, MD  Omega-3 Fatty Acids (FISH OIL) 1000 MG CPDR Take 1,000 mg by mouth daily.   Yes [provider]  rosuvastatin (CRESTOR) 20 MG tablet TAKE 1 TABLET ONCE DAILY. Patient taking differently: Take 20 mg by mouth at bedtime. 08/18/21  Yes [name], MD    Physical Exam: Vitals:   08/27/21 2136 08/27/21 2154  BP:  127/61  Pulse: 80 79  Resp:  18  Temp:  98.2 F (36.8 C)  TempSrc:  Oral  SpO2: 98% 98%    Constitutional: NAD, calm, comfortable Vitals:   08/27/21 2136 08/27/21 2154  BP:  127/61  Pulse: 80 79  Resp:  18  Temp:  98.2 F (36.8 C)  TempSrc:  Oral  SpO2: 98% 98%   General: WDWN, Alert and oriented x3.  Eyes: EOMI, PERRL, conjunctivae pale pink.  Sclera nonicteric HENT:  [place]/AT, external ears normal.  Nares patent without epistasis.  Mucous membranes are moist.  Neck: Soft, normal range of motion, supple, no masses,  Trachea midline Respiratory: clear to auscultation bilaterally, no wheezing, no crackles.  Normal respiratory effort. No accessory muscle use.  Cardiovascular: Regular rate and rhythm, no murmurs / rubs / gallops. No extremity edema. 2+ pedal pulses.  Abdomen: Soft, no tenderness, nondistended, no rebound or guarding.  No masses palpated. Bowel sounds normoactive Musculoskeletal: FROM. no cyanosis. No joint deformity upper and lower extremities. Normal muscle tone.  Skin: Warm, dry, intact no rashes, lesions, ulcers. No induration Neurologic: CN 2-12 grossly intact.  Normal speech.  Sensation intact to touch. Strength 5/5 in all extremities.   Psychiatric: Normal judgment and insight.  Normal mood.    Labs on Admission: I have personally reviewed following labs and imaging studies  CBC: Recent Labs  Lab 08/27/21 2210  WBC 6.3  NEUTROABS 3.8  HGB 7.5*  HCT 26.2*  MCV 87.9  PLT 924    Basic Metabolic Panel: Recent Labs  Lab 08/27/21 2210  NA 135  K 4.4  CL 101  CO2 26  GLUCOSE 129*  BUN 15  CREATININE 1.33*  CALCIUM  8.9    GFR: Estimated Creatinine Clearance: 62.4 mL/min (A) (by C-G formula based on SCr of 1.33 mg/dL (H)).  Liver Function Tests: Recent Labs  Lab 08/27/21 2210  AST 14*  ALT 10  ALKPHOS 58  BILITOT 0.8  PROT 7.2  ALBUMIN 3.5    Urine analysis:    Component Value Date/Time   COLORURINE yellow 05/14/2010 0900   APPEARANCEUR Clear 05/14/2010 0900   LABSPEC <1.005 05/14/2010 0900   PHURINE 6.0 05/14/2010 0900   HGBUR large 05/14/2010 0900   BILIRUBINUR negative 02/17/2021 1139   PROTEINUR Negative 02/17/2021 1139   UROBILINOGEN 0.2 02/17/2021 1139   UROBILINOGEN 0.2 05/14/2010 0900   NITRITE positive 02/17/2021 1139   NITRITE positive 05/14/2010 0900   LEUKOCYTESUR Large (3+) (A) 02/17/2021 1139    Radiological Exams on Admission: DG Chest 2 View  Result Date: 08/27/2021 CLINICAL DATA:  Shortness of breath, weakness EXAM: CHEST - 2 VIEW.  Patient is slightly rotated on the frontal view. COMPARISON:  Chest x-ray 09/06/2009 FINDINGS: The heart and mediastinal contours are unchanged. Cardiac surgical changes overlie the mediastinum. Left base atelectasis. Slightly increased interstitial and airspace markings within the left lower lobe. No focal consolidation. Chronic coarsened interstitial markings with no overt pulmonary edema. No pleural effusion. No pneumothorax. No acute osseous abnormality. IMPRESSION: Chronic bronchitic changes with query superimposed retrocardiac infection/inflammation. Electronically Signed   By: Iven Finn M.D.   On: 08/27/2021 22:21    EKG: Independently reviewed.  EKG shows normal sinus rhythm with no acute ST changes.  No acute ST elevation or depression.  QTc 433  Assessment/Plan Principal Problem:   GI bleeding Mr. Dimartino is admitted to medical telemetry floor. Placed on protonix IV Plavix held.  Patient was Hemoccult positive in the emergency room. Gastroenterology's been consulted and will see patient in morning for evaluation Every 6  hour hemoglobin and hematocrit.   Transfuse 1 unit PRBCs with target hemoglobin of 8 with history of cardiovascular disease  Active Problems:   Essential hypertension Continue home medications.  Monitor blood pressure    CORONARY ATHEROSCLEROSIS NATIVE CORONARY ARTERY Stable    DVT prophylaxis:      SCDs DVT prophylaxis with GI bleeding Code Status:   Full code Family Communication:  Diagnosis and plan discussed with patient.  Verbalized understanding agrees with plan.  Questions answered.  Further recommendations to follow as clinical indicated Disposition Plan:   Patient is from:  Home  Anticipated DC to:  Home  Anticipated DC  date:  Anticipate 2 midnight or more stay in the hospital  Consults called:  Gastroenterology consulted by ER PA to see patient in the morning Admission status:  Inpatient  Yevonne Aline Acy Orsak MD [facility]  How to contact the [facility] Attending or Consulting provider [place] or covering provider during after hours [place], for this patient?   Check the care team in [facility] and look for a) attending/consulting [facility] provider listed and b) the [facility] team listed Log into [email or web address] and use [place]'s universal password to access. If you do not have the password, please contact the hospital operator. Locate the [facility] provider you are looking for under [facility] and page to a number that you can be directly reached. If you still have difficulty reaching the provider, please page the [facility] (Director on Call) for the Hospitalists listed on amion for assistance.  08/28/2021, 1:29 AM

## 2021-08-28 NOTE — Plan of Care (Signed)

## 2021-08-29 ENCOUNTER — Encounter (HOSPITAL_COMMUNITY)
Admission: EM | Disposition: A | Discharge status: Home / Self Care | Payer: Self-pay | Source: Home / Self Care | Attending: Student

## 2021-08-29 ENCOUNTER — Inpatient Hospital Stay (HOSPITAL_COMMUNITY): Payer: Medicare Other

## 2021-08-29 ENCOUNTER — Ambulatory Visit (HOSPITAL_COMMUNITY): Admission: RE | Admit: 2021-08-29 | Payer: Medicare Other | Source: Home / Self Care | Admitting: Cardiovascular Disease

## 2021-08-29 DIAGNOSIS — K922 Gastrointestinal hemorrhage, unspecified: Secondary | ICD-10-CM | POA: Diagnosis not present

## 2021-08-29 DIAGNOSIS — D500 Iron deficiency anemia secondary to blood loss (chronic): Secondary | ICD-10-CM | POA: Diagnosis not present

## 2021-08-29 DIAGNOSIS — K31811 Angiodysplasia of stomach and duodenum with bleeding: Secondary | ICD-10-CM | POA: Diagnosis not present

## 2021-08-29 DIAGNOSIS — G894 Chronic pain syndrome: Secondary | ICD-10-CM

## 2021-08-29 DIAGNOSIS — R011 Cardiac murmur, unspecified: Secondary | ICD-10-CM | POA: Diagnosis not present

## 2021-08-29 DIAGNOSIS — N1831 Chronic kidney disease, stage 3a: Secondary | ICD-10-CM

## 2021-08-29 DIAGNOSIS — I2510 Atherosclerotic heart disease of native coronary artery without angina pectoris: Secondary | ICD-10-CM | POA: Diagnosis not present

## 2021-08-29 DIAGNOSIS — D62 Acute posthemorrhagic anemia: Secondary | ICD-10-CM | POA: Diagnosis not present

## 2021-08-29 DIAGNOSIS — K921 Melena: Secondary | ICD-10-CM | POA: Diagnosis not present

## 2021-08-29 LAB — HEMOGLOBIN AND HEMATOCRIT, BLOOD
HCT: 26.9 % — ABNORMAL LOW (ref 39.0–52.0)
HCT: 26.2 % — ABNORMAL LOW (ref 39.0–52.0)
HCT: 24.1 % — ABNORMAL LOW (ref 39.0–52.0)
Hemoglobin: 8 g/dL — ABNORMAL LOW (ref 13.0–17.0)
Hemoglobin: 7.6 g/dL — ABNORMAL LOW (ref 13.0–17.0)
Hemoglobin: 7.2 g/dL — ABNORMAL LOW (ref 13.0–17.0)

## 2021-08-29 LAB — RENAL FUNCTION PANEL
Albumin: 3.3 g/dL — ABNORMAL LOW (ref 3.5–5.0)
Anion gap: 7 (ref 5–15)
BUN: 17 mg/dL (ref 8–23)
CO2: 25 mmol/L (ref 22–32)
Calcium: 8.7 mg/dL — ABNORMAL LOW (ref 8.9–10.3)
Chloride: 103 mmol/L (ref 98–111)
Creatinine, Ser: 1.51 mg/dL — ABNORMAL HIGH (ref 0.61–1.24)
GFR, Estimated: 47 mL/min — ABNORMAL LOW (ref >60)
Glucose, Bld: 121 mg/dL — ABNORMAL HIGH (ref 70–99)
Phosphorus: 3.2 mg/dL (ref 2.5–4.6)
Potassium: 3.9 mmol/L (ref 3.5–5.1)
Sodium: 135 mmol/L (ref 135–145)

## 2021-08-29 LAB — CBC
HCT: 27.1 % — ABNORMAL LOW (ref 39.0–52.0)
Hemoglobin: 8 g/dL — ABNORMAL LOW (ref 13.0–17.0)
MCH: 25.5 pg — ABNORMAL LOW (ref 26.0–34.0)
MCHC: 29.5 g/dL — ABNORMAL LOW (ref 30.0–36.0)
MCV: 86.3 fL (ref 80.0–100.0)
Platelets: 183 10*3/uL (ref 150–400)
RBC: 3.14 MIL/uL — ABNORMAL LOW (ref 4.22–5.81)
RDW: 16.1 % — ABNORMAL HIGH (ref 11.5–15.5)
WBC: 4.7 10*3/uL (ref 4.0–10.5)
nRBC: 0 % (ref 0.0–0.2)

## 2021-08-29 LAB — ECHOCARDIOGRAM COMPLETE
AR max vel: 1.8 cm2
AV Area VTI: 1.65 cm2
AV Area mean vel: 1.65 cm2
AV Mean grad: 10.3 mmHg
AV Peak grad: 16.6 mmHg
Ao pk vel: 2.04 m/s
Area-P 1/2: 3.6 cm2
Height: 76 in
S' Lateral: 3.2 cm
Weight: 3961.23 oz

## 2021-08-29 LAB — MAGNESIUM: Magnesium: 2 mg/dL (ref 1.7–2.4)

## 2021-08-29 SURGERY — LEFT HEART CATH AND CORONARY ANGIOGRAPHY
Anesthesia: LOCAL

## 2021-08-29 MED ORDER — PEG-KCL-NACL-NASULF-NA ASC-C 100 G PO SOLR: 0.5000 | Freq: Once | ORAL | Status: DC

## 2021-08-29 MED ORDER — PEG-KCL-NACL-NASULF-NA ASC-C 100 G PO SOLR: 1.0000 | Freq: Once | ORAL | Status: DC

## 2021-08-29 MED ORDER — PEG-KCL-NACL-NASULF-NA ASC-C 100 G PO SOLR
0.5000 | Freq: Once | ORAL | Status: DC
  Filled 2021-08-29: qty 1

## 2021-08-29 MED ORDER — GABAPENTIN 300 MG PO CAPS
300.0000 mg | ORAL_CAPSULE | Freq: Two times a day (BID) | ORAL | Status: DC
  Administered 2021-08-29: 600 mg via ORAL

## 2021-08-29 MED ORDER — SODIUM CHLORIDE 0.9 % IV SOLN
250.0000 mg | INTRAVENOUS | Status: AC
  Administered 2021-08-29 – 2021-08-31 (×2): 250 mg via INTRAVENOUS
  Filled 2021-08-29 (×2): qty 20

## 2021-08-29 MED ORDER — SODIUM CHLORIDE 0.9 % WEIGHT BASED INFUSION
1.0000 mL/kg/h | INTRAVENOUS | Status: DC
  Administered 2021-08-29: 1 mL/kg/h via INTRAVENOUS

## 2021-08-29 MED ORDER — GABAPENTIN 300 MG PO CAPS
900.0000 mg | ORAL_CAPSULE | Freq: Every day | ORAL | Status: DC
  Administered 2021-08-29 – 2021-08-31 (×3): 900 mg via ORAL
  Filled 2021-08-29 (×3): qty 3

## 2021-08-29 MED ORDER — GABAPENTIN 300 MG PO CAPS
600.0000 mg | ORAL_CAPSULE | Freq: Two times a day (BID) | ORAL | Status: DC
  Administered 2021-08-30 – 2021-09-01 (×5): 600 mg via ORAL
  Filled 2021-08-29 (×6): qty 2

## 2021-08-29 MED ORDER — GABAPENTIN 300 MG PO CAPS: 900.0000 mg | ORAL_CAPSULE | Freq: Every day | ORAL | Status: DC

## 2021-08-29 MED ORDER — SODIUM CHLORIDE 0.9 % WEIGHT BASED INFUSION
3.0000 mL/kg/h | INTRAVENOUS | Status: AC
  Administered 2021-08-29: 3 mL/kg/h via INTRAVENOUS

## 2021-08-29 NOTE — Progress Notes (Signed)
Dr. Sidney Ace notified about patient's drop in Hgb. Will order H&H q 6 hours.

## 2021-08-29 NOTE — Progress Notes (Signed)
PROGRESS NOTE  Rodney Barnett:[id number] DOB: [birth year]   PCP: [name], MD  Patient is from: Home.  DOA: 08/27/2021 LOS: 1  Chief complaints:  Chief Complaint  Patient presents with   Abnormal Lab     Brief Narrative / Interim history: 80 year old M with PMH of CAD/CABG in 1994, bladder cancer s/p TURBT and chemo in 2019, CKD-3A, chronic back pain, HTN and HLD directed to ED by his cardiologist for low hemoglobin.  Patient had a lab work for his upcoming heart catheterization on 08/29/2021, and his Hgb was 7.6 from 14.3 in 08/2020.  He had generalized weakness, fatigue, DOE for 1 week and melena for 2 weeks.  He is on Plavix and aspirin.  Also takes Goody powder occasionally.  On arrival to ED, he was HDS.  Hgb 7.5.  Hemoccult positive.  Transfused 1 unit with minimal response.   GI to scope after Plavix washout.  Last dose was on 11/22  Subjective: Seen and examined earlier this morning.  No major events overnight of this morning.  Complains pain in his feet from his neuropathy.  He reports 1 dark BM last night.  He denies chest pain, dyspnea, palpitation, dizziness or abdominal pain.  Objective: Vitals:   08/28/21 1559 08/28/21 2042 08/28/21 2047 08/29/21 0321  BP: 117/61 132/71    Pulse: 76 83    Resp: 18 18    Temp: 97.9 F (36.6 C)  97.9 F (36.6 C)   TempSrc: Oral  Oral   SpO2: 95% 96%    Weight:    112.3 kg  Height:        Intake/Output Summary (Last 24 hours) at 08/29/2021 1210 Last data filed at 08/29/2021 0456 Gross per 24 hour  Intake 637 ml  Output 300 ml  Net 337 ml   Filed Weights   08/28/21 1429 08/29/21 0321  Weight: 113.5 kg 112.3 kg    Examination:  GENERAL: No apparent distress.  Nontoxic. HEENT: MMM.  Vision and hearing grossly intact.  NECK: Supple.  No apparent JVD.  RESP: 96% on RA.  No IWOB.  Fair aeration bilaterally. CVS:  RRR. Heart sounds normal.  ABD/GI/GU: BS+. Abd soft, NTND.  MSK/EXT:  Moves extremities. No  apparent deformity. No edema.  SKIN: no apparent skin lesion or wound NEURO: Awake and alert. Oriented appropriately.  No apparent focal neuro deficit. PSYCH: Calm. Normal affect.   Procedures:  None  Microbiology summarized: NFAOZ-30 and influenza PCR nonreactive.  Assessment & Plan: Symptomatic acute on chronic blood loss anemia likely due to upper GI bleed: Hgb 7.5 (14.3 in 08/2020).  Patient endorses generalized weakness, fatigue, DOE for 1 week and melena for 2 weeks.  On Plavix and aspirin.  Also reports using Goody powders occasionally.  BUN within normal favoring chronic bleed.  Anemia panel with severe iron deficiency.  Transfused 1 unit with minimal response but H&H stable. Recent Labs    08/27/21 2210 08/28/21 0822 08/28/21 1701 08/29/21 0356 08/29/21 0837  HGB 7.5* 8.1* 8.0* 8.0* 8.0*  -IV ferric gluconate 250 mg x 1 today.  Repeat dose in 2 days -Monitor H&H every 8 hours. -Continue IV Protonix 40 mg twice daily -Plan for EGD and colonoscopy after 5 days of Plavix washout.  Last dose on 11/22. -Continue holding Plavix  -Okay to continue aspirin per GI -Advised against NSAID moving forward -Transfuse for Hgb less than 8.0 or if he is symptomatic again -Ambulate patient.  History of CAD/CABG in 1994-seen by cardiologist  yesterday for DOE which could be likely from anemia at this point.  He is on Plavix and aspirin.  -Cardiology following. -Continue holding Plavix -Continue aspirin -Continue home Imdur, Toprol and Crestor. -Follow echocardiogram  CKD-3A: Cr slightly up.  Recent Labs    08/20/21 1341 08/27/21 2210 08/28/21 0822 08/29/21 0356  BUN 24* 15 15 17   CREATININE 1.41 1.33* 1.47* 1.51*  -Continue monitoring -Hold home lisinopril  Chronic back pain/neuropathic pain -Continue home Norco at reduced dose -Increase night dose of gabapentin to 900 mg  Essential hypertension: Normotensive. -Continue home Imdur and metoprolol -Hold home  lisinopril  History of bladder cancer s/p TURBT and chemo in 2019. -Outpatient follow-up   Class I obesity Body mass index is 30.14 kg/m.         DVT prophylaxis:  SCDs Start: 08/28/21 0159  Code Status: Full code Family Communication: Patient and/or RN. Available if any question.  Level of care: Telemetry Medical Status is: Inpatient  Remains inpatient appropriate because: Evaluation and treatment of acute on chronic blood loss anemia due to GI bleed.  Patient needs EGD and colonoscopy after Plavix washout.       Consultants:  Gastroenterology Cardiology   Sch Meds:  Scheduled Meds:  gabapentin  600 mg Oral TID   isosorbide mononitrate  30 mg Oral Daily   metoprolol tartrate  25 mg Oral BID   [START ON 08/31/2021] pantoprazole  40 mg Intravenous Q12H   rosuvastatin  20 mg Oral QHS   Continuous Infusions:  sodium chloride     sodium chloride 1 mL/kg/hr (08/29/21 0556)   ferric gluconate (FERRLECIT) IVPB     pantoprazole 8 mg/hr (08/28/21 2328)   PRN Meds:.sodium chloride, acetaminophen **OR** acetaminophen, HYDROcodone-acetaminophen, ondansetron **OR** ondansetron (ZOFRAN) IV, senna-docusate, sodium chloride flush  Antimicrobials: Anti-infectives (From admission, onward)    None        I have personally reviewed the following labs and images: CBC: Recent Labs  Lab 08/27/21 2210 08/28/21 0822 08/28/21 1701 08/29/21 0356 08/29/21 0837  WBC 6.3 4.2  --  4.7  --   NEUTROABS 3.8  --   --   --   --   HGB 7.5* 8.1* 8.0* 8.0* 8.0*  HCT 26.2* 28.3* 27.3* 27.1* 26.9*  MCV 87.9 88.2  --  86.3  --   PLT 213 178  --  183  --    BMP &GFR Recent Labs  Lab 08/27/21 2210 08/28/21 0822 08/29/21 0356  NA 135 137 135  K 4.4 3.8 3.9  CL 101 103 103  CO2 26 28 25   GLUCOSE 129* 116* 121*  BUN 15 15 17   CREATININE 1.33* 1.47* 1.51*  CALCIUM 8.9 8.6* 8.7*  MG  --   --  2.0  PHOS  --   --  3.2   Estimated Creatinine Clearance: 54.4 mL/min (A) (by C-G  formula based on SCr of 1.51 mg/dL (H)). Liver & Pancreas: Recent Labs  Lab 08/27/21 2210 08/29/21 0356  AST 14*  --   ALT 10  --   ALKPHOS 58  --   BILITOT 0.8  --   PROT 7.2  --   ALBUMIN 3.5 3.3*   No results for input(s): LIPASE, AMYLASE in the last 168 hours. No results for input(s): AMMONIA in the last 168 hours. Diabetic: No results for input(s): HGBA1C in the last 72 hours. No results for input(s): GLUCAP in the last 168 hours. Cardiac Enzymes: No results for input(s): CKTOTAL, CKMB, CKMBINDEX, TROPONINI in the  last 168 hours. No results for input(s): PROBNP in the last 8760 hours. Coagulation Profile: No results for input(s): INR, PROTIME in the last 168 hours. Thyroid Function Tests: No results for input(s): TSH, T4TOTAL, FREET4, T3FREE, THYROIDAB in the last 72 hours. Lipid Profile: No results for input(s): CHOL, HDL, LDLCALC, TRIG, CHOLHDL, LDLDIRECT in the last 72 hours. Anemia Panel: Recent Labs    08/28/21 1328  VITAMINB12 246  FOLATE 11.5  FERRITIN 5*  TIBC 405  IRON 23*  RETICCTPCT 4.0*   Urine analysis:    Component Value Date/Time   COLORURINE yellow 05/14/2010 0900   APPEARANCEUR Clear 05/14/2010 0900   LABSPEC <1.005 05/14/2010 0900   PHURINE 6.0 05/14/2010 0900   HGBUR large 05/14/2010 0900   BILIRUBINUR negative 02/17/2021 1139   PROTEINUR Negative 02/17/2021 1139   UROBILINOGEN 0.2 02/17/2021 1139   UROBILINOGEN 0.2 05/14/2010 0900   NITRITE positive 02/17/2021 1139   NITRITE positive 05/14/2010 0900   LEUKOCYTESUR Large (3+) (A) 02/17/2021 1139   Sepsis Labs: Invalid input(s): PROCALCITONIN, [place]  Microbiology: Recent Results (from the past 240 hour(s))  Resp Panel by RT-PCR (Flu A&B, Covid) Nasopharyngeal Swab     Status: None   Collection Time: 08/27/21  9:42 PM   Specimen: Nasopharyngeal Swab; Nasopharyngeal(NP) swabs in vial transport medium  Result Value Ref Range Status   SARS Coronavirus 2 by RT PCR NEGATIVE  NEGATIVE Final    Comment: (NOTE) SARS-CoV-2 target nucleic acids are NOT DETECTED.  The SARS-CoV-2 RNA is generally detectable in upper respiratory specimens during the acute phase of infection. The lowest concentration of SARS-CoV-2 viral copies this assay can detect is 138 copies/mL. A negative result does not preclude SARS-Cov-2 infection and should not be used as the sole basis for treatment or other patient management decisions. A negative result may occur with  improper specimen collection/handling, submission of specimen other than nasopharyngeal swab, presence of viral mutation(s) within the areas targeted by this assay, and inadequate number of viral copies(<138 copies/mL). A negative result must be combined with clinical observations, patient history, and epidemiological information. The expected result is Negative.  Fact Sheet for Patients:  EntrepreneurPulse.com.au  Fact Sheet for Healthcare Providers:  IncredibleEmployment.be  This test is no t yet approved or cleared by the [place] FDA and  has been authorized for detection and/or diagnosis of SARS-CoV-2 by FDA under an Emergency Use Authorization (EUA). This EUA will remain  in effect (meaning this test can be used) for the duration of the COVID-19 declaration under Section 564(b)(1) of the Act, 21 U.S.C.section 360bbb-3(b)(1), unless the authorization is terminated  or revoked sooner.       Influenza A by PCR NEGATIVE NEGATIVE Final   Influenza B by PCR NEGATIVE NEGATIVE Final    Comment: (NOTE) The Xpert Xpress SARS-CoV-2/FLU/RSV plus assay is intended as an aid in the diagnosis of influenza from Nasopharyngeal swab specimens and should not be used as a sole basis for treatment. Nasal washings and aspirates are unacceptable for Xpert Xpress SARS-CoV-2/FLU/RSV testing.  Fact Sheet for Patients: EntrepreneurPulse.com.au  Fact Sheet for Healthcare  Providers: IncredibleEmployment.be  This test is not yet approved or cleared by the [place] FDA and has been authorized for detection and/or diagnosis of SARS-CoV-2 by FDA under an Emergency Use Authorization (EUA). This EUA will remain in effect (meaning this test can be used) for the duration of the COVID-19 declaration under Section 564(b)(1) of the Act, 21 U.S.C. section 360bbb-3(b)(1), unless the authorization is terminated or revoked.  Performed at [facility], [place] [address] [place], [place] [address]     Radiology Studies: ECHOCARDIOGRAM COMPLETE  Result Date: 08/29/2021    ECHOCARDIOGRAM REPORT   Patient Name:   Rodney Barnett Date of Exam: 08/29/2021 Medical Rec #:  [id number]    Height:       76.0 in Accession #:    [id number]   Weight:       247.6 lb Date of Birth:  [birth year]    BSA:          2.426 m Patient Age:    70 years     BP:           132/47 mmHg Patient Gender: M            HR:           70 bpm. Exam Location:  Inpatient Procedure: 2D Echo, Color Doppler and Cardiac Doppler Indications:    murmur  History:        Patient has no prior history of Echocardiogram examinations.                 CAD, Prior CABG; Risk Factors:Hypertension and Dyslipidemia.  Sonographer:    Melissa Morford RDCS (AE, PE) Referring Phys: [id number] [place]  1. Left ventricular ejection fraction, by estimation, is 60 to 65%. The left ventricle has normal function. The left ventricle has no regional wall motion abnormalities. Left ventricular diastolic parameters were normal.  2. Right ventricular systolic function is mildly reduced. The right ventricular size is mildly enlarged.  3. Left atrial size was moderately dilated.  4. The mitral valve is abnormal. Mild mitral valve regurgitation. No evidence of mitral stenosis.  5. Tricuspid valve regurgitation is moderate.  6. The aortic valve is tricuspid. There is moderate calcification of the aortic valve.  There is moderate thickening of the aortic valve. Aortic valve regurgitation is not visualized. Mild aortic valve stenosis.  7. The inferior vena cava is normal in size with greater than 50% respiratory variability, suggesting right atrial pressure of 3 mmHg. FINDINGS  Left Ventricle: Left ventricular ejection fraction, by estimation, is 60 to 65%. The left ventricle has normal function. The left ventricle has no regional wall motion abnormalities. The left ventricular internal cavity size was normal in size. There is  no left ventricular hypertrophy. Left ventricular diastolic parameters were normal. Right Ventricle: The right ventricular size is mildly enlarged. Right vetricular wall thickness was not assessed. Right ventricular systolic function is mildly reduced. Left Atrium: Left atrial size was moderately dilated. Right Atrium: Right atrial size was normal in size. Pericardium: There is no evidence of pericardial effusion. Mitral Valve: The mitral valve is abnormal. There is mild thickening of the mitral valve leaflet(s). There is mild calcification of the mitral valve leaflet(s). Mild mitral annular calcification. Mild mitral valve regurgitation. No evidence of mitral valve stenosis. Tricuspid Valve: The tricuspid valve is normal in structure. Tricuspid valve regurgitation is moderate . No evidence of tricuspid stenosis. Aortic Valve: The aortic valve is tricuspid. There is moderate calcification of the aortic valve. There is moderate thickening of the aortic valve. Aortic valve regurgitation is not visualized. Mild aortic stenosis is present. Aortic valve mean gradient measures 10.2 mmHg. Aortic valve peak gradient measures 16.6 mmHg. Aortic valve area, by VTI measures 1.65 cm. Pulmonic Valve: The pulmonic valve was normal in structure. Pulmonic valve regurgitation is mild. No evidence of pulmonic stenosis. Aorta: The aortic root is normal in  size and structure. Venous: The inferior vena cava is normal in  size with greater than 50% respiratory variability, suggesting right atrial pressure of 3 mmHg. IAS/Shunts: No atrial level shunt detected by color flow Doppler.  LEFT VENTRICLE PLAX 2D LVIDd:         5.00 cm   Diastology LVIDs:         3.20 cm   LV e' medial:    6.42 cm/s LV PW:         0.80 cm   LV E/e' medial:  12.7 LV IVS:        0.90 cm   LV e' lateral:   8.38 cm/s LVOT diam:     2.30 cm   LV E/e' lateral: 9.7 LV SV:         81 LV SV Index:   34 LVOT Area:     4.15 cm  RIGHT VENTRICLE RV S prime:     10.40 cm/s TAPSE (M-mode): 2.1 cm LEFT ATRIUM             Index        RIGHT ATRIUM           Index LA diam:        4.50 cm 1.86 cm/m   RA Area:     13.90 cm LA Vol (A2C):   60.3 ml 24.86 ml/m  RA Volume:   28.60 ml  11.79 ml/m LA Vol (A4C):   46.1 ml 19.01 ml/m LA Biplane Vol: 55.4 ml 22.84 ml/m  AORTIC VALVE AV Area (Vmax):    1.80 cm AV Area (Vmean):   1.65 cm AV Area (VTI):     1.65 cm AV Vmax:           203.50 cm/s AV Vmean:          150.750 cm/s AV VTI:            0.495 m AV Peak Grad:      16.6 mmHg AV Mean Grad:      10.2 mmHg LVOT Vmax:         88.30 cm/s LVOT Vmean:        59.900 cm/s LVOT VTI:          0.196 m LVOT/AV VTI ratio: 0.40  AORTA Ao Root diam: 3.30 cm Ao Asc diam:  2.90 cm MITRAL VALVE               TRICUSPID VALVE MV Area (PHT): 3.60 cm    TR Peak grad:   33.2 mmHg MV Decel Time: 211 msec    TR Vmax:        288.00 cm/s MV E velocity: 81.50 cm/s MV A velocity: 36.40 cm/s  SHUNTS MV E/A ratio:  2.24        Systemic VTI:  0.20 m                            Systemic Diam: 2.30 cm Jenkins Rouge MD Electronically signed by Jenkins Rouge MD Signature Date/Time: 08/29/2021/11:20:18 AM    Final        Enzio Buchler T. Kennan  If 7PM-7AM, please contact night-coverage [email or web address] 08/29/2021, 12:10 PM

## 2021-08-29 NOTE — Progress Notes (Signed)
Progress Note  Patient Name: Rodney Barnett Date of Encounter: 08/29/2021  [facility] Cardiologist: Rozann Lesches, MD   Subjective   Breathing is OK in bed  No CP    Inpatient Medications    Scheduled Meds:  gabapentin  600 mg Oral TID   isosorbide mononitrate  30 mg Oral Daily   metoprolol tartrate  25 mg Oral BID   [START ON 08/31/2021] pantoprazole  40 mg Intravenous Q12H   rosuvastatin  20 mg Oral QHS   Continuous Infusions:  sodium chloride     sodium chloride 1 mL/kg/hr (08/29/21 0556)   ferric gluconate (FERRLECIT) IVPB     pantoprazole 8 mg/hr (08/28/21 2328)   PRN Meds: sodium chloride, acetaminophen **OR** acetaminophen, HYDROcodone-acetaminophen, ondansetron **OR** ondansetron (ZOFRAN) IV, senna-docusate, sodium chloride flush   Vital Signs    Vitals:   08/28/21 1559 08/28/21 2042 08/28/21 2047 08/29/21 0321  BP: 117/61 132/71    Pulse: 76 83    Resp: 18 18    Temp: 97.9 F (36.6 C)  97.9 F (36.6 C)   TempSrc: Oral  Oral   SpO2: 95% 96%    Weight:    112.3 kg  Height:        Intake/Output Summary (Last 24 hours) at 08/29/2021 0824 Last data filed at 08/29/2021 0456 Gross per 24 hour  Intake 637 ml  Output 300 ml  Net 337 ml   Last 3 Weights 08/29/2021 08/28/2021 08/27/2021  Weight (lbs) 247 lb 9.2 oz 250 lb 3.6 oz 252 lb 12.8 oz  Weight (kg) 112.3 kg 113.5 kg 114.669 kg      Telemetry    SR - Personally Reviewed  ECG     No new - Personally Reviewed  Physical Exam   GEN: No acute distress.   Neck: No JVD Cardiac: RRR, no murmurs  Respiratory: Clear to auscultation bilaterally. GI: Soft, nontender, non-distended  MS: No edema; No deformity. Neuro:  Nonfocal  Psych: Normal affect   Labs    High Sensitivity Troponin:   Recent Labs  Lab 08/27/21 2210 08/27/21 2340 08/28/21 0822 08/28/21 1328  TROPONINIHS 47* 44* 36* 31*     Chemistry Recent Labs  Lab 08/27/21 2210 08/28/21 0822 08/29/21 0356  NA 135 137 135   K 4.4 3.8 3.9  CL 101 103 103  CO2 26 28 25   GLUCOSE 129* 116* 121*  BUN 15 15 17   CREATININE 1.33* 1.47* 1.51*  CALCIUM 8.9 8.6* 8.7*  MG  --   --  2.0  PROT 7.2  --   --   ALBUMIN 3.5  --  3.3*  AST 14*  --   --   ALT 10  --   --   ALKPHOS 58  --   --   BILITOT 0.8  --   --   GFRNONAA 54* 48* 47*  ANIONGAP 8 6 7     Lipids No results for input(s): CHOL, TRIG, HDL, LABVLDL, LDLCALC, CHOLHDL in the last 168 hours.  Hematology Recent Labs  Lab 08/27/21 2210 08/28/21 0822 08/28/21 1328 08/28/21 1701 08/29/21 0356  WBC 6.3 4.2  --   --  4.7  RBC 2.98* 3.21* 3.02*  --  3.14*  HGB 7.5* 8.1*  --  8.0* 8.0*  HCT 26.2* 28.3*  --  27.3* 27.1*  MCV 87.9 88.2  --   --  86.3  MCH 25.2* 25.2*  --   --  25.5*  MCHC 28.6* 28.6*  --   --  29.5*  RDW 16.3* 16.0*  --   --  16.1*  PLT 213 178  --   --  183   Thyroid No results for input(s): TSH, FREET4 in the last 168 hours.  BNPNo results for input(s): BNP, PROBNP in the last 168 hours.  DDimer No results for input(s): DDIMER in the last 168 hours.   Radiology    DG Chest 2 View  Result Date: 08/27/2021 CLINICAL DATA:  Shortness of breath, weakness EXAM: CHEST - 2 VIEW.  Patient is slightly rotated on the frontal view. COMPARISON:  Chest x-ray 09/06/2009 FINDINGS: The heart and mediastinal contours are unchanged. Cardiac surgical changes overlie the mediastinum. Left base atelectasis. Slightly increased interstitial and airspace markings within the left lower lobe. No focal consolidation. Chronic coarsened interstitial markings with no overt pulmonary edema. No pleural effusion. No pneumothorax. No acute osseous abnormality. IMPRESSION: Chronic bronchitic changes with query superimposed retrocardiac infection/inflammation. Electronically Signed   By: Iven Finn M.D.   On: 08/27/2021 22:21    Cardiac Studies     Patient Profile   Patient Profile:  Rodney Barnett is a 80 y.o. male with a hx of CAD status post CABG, hypertension,  hyperlipidemia who is being seen today for the evaluation of CAD at the request of Wendee Beavers, MD. Assessment & Plan   1  Dyspnea.  Pt presents with increased DOE.  Symptoms odeveloped over past few wks   On admit found to be severely anemic.  Hgb 7.5   Pt had melena Symptoms most likely related to severe anemia in setting of significant CAD    Would look for cuase   Being evalauted by GI with plan for endoscopy  2 CAD  Pt s/p CABG 1994  Cath in 2010 showed patient LIMA to LAD   Mild dz in LCx   Occluded RCA fills via collaterals   SVG to Diag occluded.  SVG to RCA occldued  Again, ow with SOB progressive over the last few weeks    Trooinin minimally elevateed, flatt (45,62,56,38) I am not convinced of active ischemia from worsening CAD       Symptoms probably related to anemia in the setting of baseline significant CAD    He is now on ecASA   Off of Plavix  He had been on it chronically due to severity of CAD  3  GI   GI seeing patient   Planning endosopy  4  Anemia   Transfuse if Hgb less than 7.5  5  HTN   BP is controlled      For questions or updates, please contact [facility] Please consult [email or web address] for contact info under        Signed, Dorris Carnes, MD  08/29/2021, 8:24 AM

## 2021-08-30 ENCOUNTER — Other Ambulatory Visit: Payer: Self-pay

## 2021-08-30 DIAGNOSIS — D62 Acute posthemorrhagic anemia: Secondary | ICD-10-CM | POA: Diagnosis not present

## 2021-08-30 DIAGNOSIS — D500 Iron deficiency anemia secondary to blood loss (chronic): Secondary | ICD-10-CM | POA: Diagnosis not present

## 2021-08-30 DIAGNOSIS — K31811 Angiodysplasia of stomach and duodenum with bleeding: Secondary | ICD-10-CM | POA: Diagnosis not present

## 2021-08-30 DIAGNOSIS — K922 Gastrointestinal hemorrhage, unspecified: Secondary | ICD-10-CM | POA: Diagnosis not present

## 2021-08-30 DIAGNOSIS — K921 Melena: Secondary | ICD-10-CM | POA: Diagnosis not present

## 2021-08-30 DIAGNOSIS — I2510 Atherosclerotic heart disease of native coronary artery without angina pectoris: Secondary | ICD-10-CM | POA: Diagnosis not present

## 2021-08-30 DIAGNOSIS — I350 Nonrheumatic aortic (valve) stenosis: Secondary | ICD-10-CM | POA: Diagnosis not present

## 2021-08-30 LAB — HEMOGLOBIN AND HEMATOCRIT, BLOOD
HCT: 25.5 % — ABNORMAL LOW (ref 39.0–52.0)
HCT: 25.6 % — ABNORMAL LOW (ref 39.0–52.0)
HCT: 29.4 % — ABNORMAL LOW (ref 39.0–52.0)
Hemoglobin: 7.7 g/dL — ABNORMAL LOW (ref 13.0–17.0)
Hemoglobin: 7.6 g/dL — ABNORMAL LOW (ref 13.0–17.0)
Hemoglobin: 8.6 g/dL — ABNORMAL LOW (ref 13.0–17.0)

## 2021-08-30 LAB — PREPARE RBC (CROSSMATCH)

## 2021-08-30 MED ORDER — SODIUM CHLORIDE 0.9% IV SOLUTION: Freq: Once | INTRAVENOUS | Status: AC

## 2021-08-30 MED ORDER — PEG-KCL-NACL-NASULF-NA ASC-C 100 G PO SOLR: 1.0000 | Freq: Once | ORAL | Status: DC

## 2021-08-30 MED ORDER — PEG-KCL-NACL-NASULF-NA ASC-C 100 G PO SOLR: 0.5000 | Freq: Once | ORAL | Status: DC

## 2021-08-30 MED ORDER — PEG-KCL-NACL-NASULF-NA ASC-C 100 G PO SOLR
0.5000 | Freq: Once | ORAL | Status: AC
  Administered 2021-08-31: 17:00:00 100 g via ORAL
  Filled 2021-08-30: qty 1

## 2021-08-30 MED ORDER — PEG-KCL-NACL-NASULF-NA ASC-C 100 G PO SOLR
0.5000 | Freq: Once | ORAL | Status: DC
Start: 2021-08-30 — End: 2021-08-30

## 2021-08-30 MED ORDER — PEG-KCL-NACL-NASULF-NA ASC-C 100 G PO SOLR
0.5000 | Freq: Once | ORAL | Status: DC
  Filled 2021-08-30: qty 1

## 2021-08-30 MED ORDER — PEG-KCL-NACL-NASULF-NA ASC-C 100 G PO SOLR
0.5000 | Freq: Once | ORAL | Status: AC
  Administered 2021-08-31: 21:00:00 100 g via ORAL
  Filled 2021-08-30: qty 1

## 2021-08-30 NOTE — Progress Notes (Signed)
Cardiology Progress Note  Patient ID: Rodney Barnett MRN: [id number] DOB: 1941-05-02 Date of Encounter: 08/30/2021  Primary Cardiologist: Rozann Lesches, MD  Subjective   Chief Complaint: None.   HPI: Awaiting Plavix washout for EGD/colonoscopy.  Denies chest pain or trouble breathing.  Echo shows mild AS.   ROS:  All other ROS reviewed and negative. Pertinent positives noted in the HPI.     Inpatient Medications  Scheduled Meds:  gabapentin  600 mg Oral BID WC   And   gabapentin  900 mg Oral QHS   isosorbide mononitrate  30 mg Oral Daily   metoprolol tartrate  25 mg Oral BID   [START ON 08/31/2021] pantoprazole  40 mg Intravenous Q12H   peg 3350 powder  0.5 kit Oral Once   And   peg 3350 powder  0.5 kit Oral Once   rosuvastatin  20 mg Oral QHS   Continuous Infusions:  sodium chloride Stopped (08/29/21 1301)   ferric gluconate (FERRLECIT) IVPB 250 mg (08/29/21 1227)   pantoprazole 8 mg/hr (08/30/21 0804)   PRN Meds: acetaminophen **OR** acetaminophen, HYDROcodone-acetaminophen, ondansetron **OR** ondansetron (ZOFRAN) IV, senna-docusate   Vital Signs   Vitals:   08/29/21 1233 08/29/21 1937 08/30/21 0419 08/30/21 0945  BP: (!) 101/52 119/65 114/71 119/69  Pulse: 69 76 73 71  Resp: _0 Temp: 97.8 F (36.6 C) 97.7 F (36.5 C) 98.2 F (36.8 C) 97.8 F (36.6 C)  TempSrc: Oral Oral Oral Oral  SpO2: 97% 98% 98% 95%  Weight:   112.3 kg   Height:        Intake/Output Summary (Last 24 hours) at 08/30/2021 1029 Last data filed at 08/30/2021 0854 Gross per 24 hour  Intake 900 ml  Output 1300 ml  Net -400 ml   Last 3 Weights 08/30/2021 08/29/2021 08/28/2021  Weight (lbs) 247 lb 9.2 oz 247 lb 9.2 oz 250 lb 3.6 oz  Weight (kg) 112.3 kg 112.3 kg 113.5 kg      Telemetry  Overnight telemetry shows sinus rhythm in the 70s, which I personally reviewed.   Physical Exam   Vitals:   08/29/21 1233 08/29/21 1937 08/30/21 0419 08/30/21 0945  BP: (!) 101/52  119/65 114/71 119/69  Pulse: 69 76 73 71  Resp: _1 Temp: 97.8 F (36.6 C) 97.7 F (36.5 C) 98.2 F (36.8 C) 97.8 F (36.6 C)  TempSrc: Oral Oral Oral Oral  SpO2: 97% 98% 98% 95%  Weight:   112.3 kg   Height:        Intake/Output Summary (Last 24 hours) at 08/30/2021 1029 Last data filed at 08/30/2021 0854 Gross per 24 hour  Intake 900 ml  Output 1300 ml  Net -400 ml    Last 3 Weights 08/30/2021 08/29/2021 08/28/2021  Weight (lbs) 247 lb 9.2 oz 247 lb 9.2 oz 250 lb 3.6 oz  Weight (kg) 112.3 kg 112.3 kg 113.5 kg    Body mass index is 30.14 kg/m.   General: Well nourished, well developed, in no acute distress Head: Atraumatic, normal size  Eyes: PEERLA, EOMI  Neck: Supple, no JVD Endocrine: No thryomegaly Cardiac: Normal S1, S2; RRR; 2 out of 6 systolic ejection murmur Lungs: Clear to auscultation bilaterally, no wheezing, rhonchi or rales  Abd: Soft, nontender, no hepatomegaly  Ext: No edema, pulses 2+ Musculoskeletal: No deformities, BUE and BLE strength normal and equal Skin: Warm and dry, no rashes   Neuro: Alert and oriented to person, place, time,  and situation, CNII-XII grossly intact, no focal deficits  Psych: Normal mood and affect   Labs  High Sensitivity Troponin:   Recent Labs  Lab 08/27/21 2210 08/27/21 2340 08/28/21 0822 08/28/21 1328  TROPONINIHS 47* 44* 36* 31*     Cardiac EnzymesNo results for input(s): TROPONINI in the last 168 hours. No results for input(s): TROPIPOC in the last 168 hours.  Chemistry Recent Labs  Lab 08/27/21 2210 08/28/21 0822 08/29/21 0356  NA 135 137 135  K 4.4 3.8 3.9  CL 101 103 103  CO2 _0 GLUCOSE 129* 116* 121*  BUN _1 CREATININE 1.33* 1.47* 1.51*  CALCIUM 8.9 8.6* 8.7*  PROT 7.2  --   --   ALBUMIN 3.5  --  3.3*  AST 14*  --   --   ALT 10  --   --   ALKPHOS 58  --   --   BILITOT 0.8  --   --   GFRNONAA 54* 48* 47*  ANIONGAP _2 Hematology Recent Labs  Lab 08/27/21 2210  08/28/21 0822 08/28/21 1328 08/28/21 1701 08/29/21 0356 08/29/21 0837 08/29/21 1634 08/29/21 2235 08/30/21 0543  WBC 6.3 4.2  --   --  4.7  --   --   --   --   RBC 2.98* 3.21* 3.02*  --  3.14*  --   --   --   --   HGB 7.5* 8.1*  --    < > 8.0*   < > 7.6* 7.2* 7.7*  HCT 26.2* 28.3*  --    < > 27.1*   < > 26.2* 24.1* 25.5*  MCV 87.9 88.2  --   --  86.3  --   --   --   --   MCH 25.2* 25.2*  --   --  25.5*  --   --   --   --   MCHC 28.6* 28.6*  --   --  29.5*  --   --   --   --   RDW 16.3* 16.0*  --   --  16.1*  --   --   --   --   PLT 213 178  --   --  183  --   --   --   --    < > = values in this interval not displayed.   BNPNo results for input(s): BNP, PROBNP in the last 168 hours.  DDimer No results for input(s): DDIMER in the last 168 hours.   Radiology  ECHOCARDIOGRAM COMPLETE  Result Date: 08/29/2021    ECHOCARDIOGRAM REPORT   Patient Name:   Rodney Barnett Date of Exam: 08/29/2021 Medical Rec #:  [id number]    Height:       76.0 in Accession #:    [id number]   Weight:       247.6 lb Date of Birth:  1941-09-23    BSA:          2.426 m Patient Age:    80 years     BP:           132/47 mmHg Patient Gender: M            HR:           70 bpm. Exam Location:  Inpatient Procedure: 2D Echo, Color Doppler and Cardiac Doppler Indications:    murmur  History:        Patient  has no prior history of Echocardiogram examinations.                 CAD, Prior CABG; Risk Factors:Hypertension and Dyslipidemia.  Sonographer:    Melissa Morford RDCS (AE, PE) Referring Phys: [id number] [place]  1. Left ventricular ejection fraction, by estimation, is 60 to 65%. The left ventricle has normal function. The left ventricle has no regional wall motion abnormalities. Left ventricular diastolic parameters were normal.  2. Right ventricular systolic function is mildly reduced. The right ventricular size is mildly enlarged.  3. Left atrial size was moderately dilated.  4. The mitral valve is  abnormal. Mild mitral valve regurgitation. No evidence of mitral stenosis.  5. Tricuspid valve regurgitation is moderate.  6. The aortic valve is tricuspid. There is moderate calcification of the aortic valve. There is moderate thickening of the aortic valve. Aortic valve regurgitation is not visualized. Mild aortic valve stenosis.  7. The inferior vena cava is normal in size with greater than 50% respiratory variability, suggesting right atrial pressure of 3 mmHg. FINDINGS  Left Ventricle: Left ventricular ejection fraction, by estimation, is 60 to 65%. The left ventricle has normal function. The left ventricle has no regional wall motion abnormalities. The left ventricular internal cavity size was normal in size. There is  no left ventricular hypertrophy. Left ventricular diastolic parameters were normal. Right Ventricle: The right ventricular size is mildly enlarged. Right vetricular wall thickness was not assessed. Right ventricular systolic function is mildly reduced. Left Atrium: Left atrial size was moderately dilated. Right Atrium: Right atrial size was normal in size. Pericardium: There is no evidence of pericardial effusion. Mitral Valve: The mitral valve is abnormal. There is mild thickening of the mitral valve leaflet(s). There is mild calcification of the mitral valve leaflet(s). Mild mitral annular calcification. Mild mitral valve regurgitation. No evidence of mitral valve stenosis. Tricuspid Valve: The tricuspid valve is normal in structure. Tricuspid valve regurgitation is moderate . No evidence of tricuspid stenosis. Aortic Valve: The aortic valve is tricuspid. There is moderate calcification of the aortic valve. There is moderate thickening of the aortic valve. Aortic valve regurgitation is not visualized. Mild aortic stenosis is present. Aortic valve mean gradient measures 10.2 mmHg. Aortic valve peak gradient measures 16.6 mmHg. Aortic valve area, by VTI measures 1.65 cm. Pulmonic Valve: The  pulmonic valve was normal in structure. Pulmonic valve regurgitation is mild. No evidence of pulmonic stenosis. Aorta: The aortic root is normal in size and structure. Venous: The inferior vena cava is normal in size with greater than 50% respiratory variability, suggesting right atrial pressure of 3 mmHg. IAS/Shunts: No atrial level shunt detected by color flow Doppler.  LEFT VENTRICLE PLAX 2D LVIDd:         5.00 cm   Diastology LVIDs:         3.20 cm   LV e' medial:    6.42 cm/s LV PW:         0.80 cm   LV E/e' medial:  12.7 LV IVS:        0.90 cm   LV e' lateral:   8.38 cm/s LVOT diam:     2.30 cm   LV E/e' lateral: 9.7 LV SV:         81 LV SV Index:   34 LVOT Area:     4.15 cm  RIGHT VENTRICLE RV S prime:     10.40 cm/s TAPSE (M-mode): 2.1 cm LEFT ATRIUM  Index        RIGHT ATRIUM           Index LA diam:        4.50 cm 1.86 cm/m   RA Area:     13.90 cm LA Vol (A2C):   60.3 ml 24.86 ml/m  RA Volume:   28.60 ml  11.79 ml/m LA Vol (A4C):   46.1 ml 19.01 ml/m LA Biplane Vol: 55.4 ml 22.84 ml/m  AORTIC VALVE AV Area (Vmax):    1.80 cm AV Area (Vmean):   1.65 cm AV Area (VTI):     1.65 cm AV Vmax:           203.50 cm/s AV Vmean:          150.750 cm/s AV VTI:            0.495 m AV Peak Grad:      16.6 mmHg AV Mean Grad:      10.2 mmHg LVOT Vmax:         88.30 cm/s LVOT Vmean:        59.900 cm/s LVOT VTI:          0.196 m LVOT/AV VTI ratio: 0.40  AORTA Ao Root diam: 3.30 cm Ao Asc diam:  2.90 cm MITRAL VALVE               TRICUSPID VALVE MV Area (PHT): 3.60 cm    TR Peak grad:   33.2 mmHg MV Decel Time: 211 msec    TR Vmax:        288.00 cm/s MV E velocity: 81.50 cm/s MV A velocity: 36.40 cm/s  SHUNTS MV E/A ratio:  2.24        Systemic VTI:  0.20 m                            Systemic Diam: 2.30 cm Jenkins Rouge MD Electronically signed by Jenkins Rouge MD Signature Date/Time: 08/29/2021/11:20:18 AM    Final     Cardiac Studies   TTE 08/29/2021  1. Left ventricular ejection fraction, by  estimation, is 60 to 65%. The  left ventricle has normal function. The left ventricle has no regional  wall motion abnormalities. Left ventricular diastolic parameters were  normal.   2. Right ventricular systolic function is mildly reduced. The right  ventricular size is mildly enlarged.   3. Left atrial size was moderately dilated.   4. The mitral valve is abnormal. Mild mitral valve regurgitation. No  evidence of mitral stenosis.   5. Tricuspid valve regurgitation is moderate.   6. The aortic valve is tricuspid. There is moderate calcification of the  aortic valve. There is moderate thickening of the aortic valve. Aortic  valve regurgitation is not visualized. Mild aortic valve stenosis.   7. The inferior vena cava is normal in size with greater than 50%  respiratory variability, suggesting right atrial pressure of 3 mmHg.   Left heart catheterization 09/09/2009 Left main 30% Left circumflex 40% LAD: 100% proximal RCA: 100% proximal, distal RCA fills by collaterals Patent LIMA to LAD Occluded SVG to RCA, occluded vein graft to diagonal  Patient Profile  CAMRIN GEARHEART is a 80 y.o. male with CAD status post CABG, hypertension, hyperlipidemia who was admitted on 08/28/2021 for shortness of breath and symptomatic anemia.  Assessment & Plan   #SOB #Symptomatic anemia #CAD status post CABG -Admitted with shortness of breath and newly reduced hemoglobin  value.  Left heart catheterization in 2010 with severe CAD.  Only his LIMA to LAD remains patent. -No symptoms of angina or chest pain. -Troponin remains elevated but flat.  Inconsistent with ACS. -Would recommend hold aspirin and Plavix as you are doing.  He will undergo evaluation by GI after appropriate Plavix washout. -Would likely recommend he just go back on aspirin after his GI bleed is resolved and we know the etiology.  We will defer the recommendations of gastroenterology.  Given profound GI bleed would recommend single  antiplatelet agent moving forward.  Hopefully he will have a very easily reversible cause for his GI bleed. -Can add back home BP medications as able.  Given continued reduced hemoglobin value would recommend to hold for now.  #Mild Aortic stenosis -Aortic stenosis mild on echo.  Will need follow-up as an outpatient.  [facility] will sign off.   Medication Recommendations: Restart aspirin single therapy once cleared by GI.  Restart home blood pressure medications as you are able. Other recommendations (labs, testing, etc): None. Follow up as an outpatient: He may keep his regular follow-up with his outpatient cardiologist.  For questions or updates, please contact [place] Please consult [email or web address] for contact info under   Time Spent with Patient: I have spent a total of 25 minutes with patient reviewing hospital notes, telemetry, EKGs, labs and examining the patient as well as establishing an assessment and plan that was discussed with the patient.  > 50% of time was spent in direct patient care.    Signed, Addison Naegeli. Audie Box, MD, [place]  08/30/2021 10:29 AM

## 2021-08-30 NOTE — Progress Notes (Signed)
PROGRESS NOTE  Rodney Barnett YJE:[id number] DOB: 1941-04-30   PCP: [name], MD  Patient is from: Home.  DOA: 08/27/2021 LOS: 2  Chief complaints:  Chief Complaint  Patient presents with   Abnormal Lab     Brief Narrative / Interim history: 80 year old M with PMH of CAD/CABG in 1994, bladder cancer s/p TURBT and chemo in 2019, CKD-3A, chronic back pain, HTN and HLD directed to ED by his cardiologist for low hemoglobin.  Patient had a lab work for his upcoming heart catheterization on 08/29/2021, and his Hgb was 7.6 from 14.3 in 08/2020.  He had generalized weakness, fatigue, DOE for 1 week and melena for 2 weeks.  He is on Plavix and aspirin.  Also takes Goody powder occasionally.  On arrival to ED, he was HDS.  Hgb 7.5.  Hemoccult positive.  Transfused 1 unit with minimal response.   GI to scope after Plavix washout.  Last dose was on 11/22  Subjective: Seen and examined earlier this morning.  No major events overnight of this morning.  Has not had further bowel movements.  Hemoglobin dropped to 7.7.  Denies chest pain, shortness of breath, dizziness or fatigue.  Objective: Vitals:   08/30/21 0419 08/30/21 0945 08/30/21 1053 08/30/21 1130  BP: 114/71 119/69 (!) 106/59 (!) 97/59  Pulse: 73 71 69 61  Resp: _0 Temp: 98.2 F (36.8 C) 97.8 F (36.6 C) 97.7 F (36.5 C) 97.6 F (36.4 C)  TempSrc: Oral Oral Oral Oral  SpO2: 98% 95% 95% 95%  Weight: 112.3 kg     Height:        Intake/Output Summary (Last 24 hours) at 08/30/2021 1340 Last data filed at 08/30/2021 1302 Gross per 24 hour  Intake 860 ml  Output 700 ml  Net 160 ml   Filed Weights   08/28/21 1429 08/29/21 0321 08/30/21 0419  Weight: 113.5 kg 112.3 kg 112.3 kg    Examination:  GENERAL: No apparent distress.  Nontoxic. HEENT: MMM.  Vision and hearing grossly intact.  NECK: Supple.  No apparent JVD.  RESP: 95% on RA.  No IWOB.  Fair aeration bilaterally. CVS:  RRR. Heart sounds normal.   ABD/GI/GU: BS+. Abd soft, NTND.  MSK/EXT:  Moves extremities. No apparent deformity. No edema.  SKIN: no apparent skin lesion or wound NEURO: Awake and alert. Oriented appropriately.  No apparent focal neuro deficit. PSYCH: Calm. Normal affect.   Procedures:  None  Microbiology summarized: [id number] and influenza PCR nonreactive.  Assessment & Plan: Symptomatic acute on chronic blood loss anemia likely due to upper GI bleed: Hgb 7.5 (14.3 in 08/2020).  Patient endorses generalized weakness, fatigue, DOE for 1 week and melena for 2 weeks.  On Plavix and aspirin.  Also reports using Goody powders occasionally.  BUN within normal favoring chronic bleed.  Anemia panel with severe iron deficiency.  Transfused 1 unit with minimal response.  Hgb down to 7.6 Recent Labs    08/27/21 2210 08/28/21 0822 08/28/21 1701 08/29/21 0356 08/29/21 0837 08/29/21 1634 08/29/21 2235 08/30/21 0543 08/30/21 1108  HGB 7.5* 8.1* 8.0* 8.0* 8.0* 7.6* 7.2* 7.7* 7.6*  -IV ferric gluconate 250 mg on 11/25 and 11/27 -Monitor H&H every 12 hours. -Continue IV Protonix 40 mg twice daily -Plan for EGD and colonoscopy after 5 days of Plavix washout.  Last dose on 11/22. -Continue holding Plavix  -Okay to continue aspirin per GI -Advised against NSAID moving forward -Transfuse 1 unit.  Goal Hgb> 8  g.  History of CAD/CABG in 1994-seen by cardiologist yesterday for DOE which could be likely from anemia at this point.  He is on Plavix and aspirin.  TTE reassuring. -Cardiology following. -Continue holding Plavix -Continue aspirin -Continue home Imdur, Toprol and Crestor.  CKD-3A: Cr slightly up.  Recent Labs    08/20/21 1341 08/27/21 2210 08/28/21 0822 08/29/21 0356  BUN 24* _0 CREATININE 1.41 1.33* 1.47* 1.51*  -Continue monitoring -Continue holding lisinopril  Chronic back pain/neuropathic pain -Continue home Norco at reduced dose -Increased night dose gabapentin to 900 mg  Essential  hypertension: Normotensive. -Continue home Imdur and metoprolol -Hold home lisinopril  History of bladder cancer s/p TURBT and chemo in 2019. -Outpatient follow-up   Class I obesity Body mass index is 30.14 kg/m.         DVT prophylaxis:  SCDs Start: 08/28/21 0159  Code Status: Full code Family Communication: Patient and/or RN. Available if any question.  Level of care: Telemetry Medical Status is: Inpatient  Remains inpatient appropriate because: Evaluation and treatment of acute on chronic blood loss anemia due to GI bleed requiring blood transfusion.  Patient needs EGD and colonoscopy after Plavix washout.       Consultants:  Gastroenterology Cardiology   Sch Meds:  Scheduled Meds:  gabapentin  600 mg Oral BID WC   And   gabapentin  900 mg Oral QHS   isosorbide mononitrate  30 mg Oral Daily   metoprolol tartrate  25 mg Oral BID   [START ON 08/31/2021] pantoprazole  40 mg Intravenous Q12H   peg 3350 powder  0.5 kit Oral Once   And   peg 3350 powder  0.5 kit Oral Once   rosuvastatin  20 mg Oral QHS   Continuous Infusions:  sodium chloride Stopped (08/29/21 1301)   ferric gluconate (FERRLECIT) IVPB 250 mg (08/29/21 1227)   pantoprazole 8 mg/hr (08/30/21 0804)   PRN Meds:.acetaminophen **OR** acetaminophen, HYDROcodone-acetaminophen, ondansetron **OR** ondansetron (ZOFRAN) IV, senna-docusate  Antimicrobials: Anti-infectives (From admission, onward)    None        I have personally reviewed the following labs and images: CBC: Recent Labs  Lab 08/27/21 2210 08/28/21 0822 08/28/21 1701 08/29/21 0356 08/29/21 0837 08/29/21 1634 08/29/21 2235 08/30/21 0543 08/30/21 1108  WBC 6.3 4.2  --  4.7  --   --   --   --   --   NEUTROABS 3.8  --   --   --   --   --   --   --   --   HGB 7.5* 8.1*   < > 8.0* 8.0* 7.6* 7.2* 7.7* 7.6*  HCT 26.2* 28.3*   < > 27.1* 26.9* 26.2* 24.1* 25.5* 25.6*  MCV 87.9 88.2  --  86.3  --   --   --   --   --   PLT 213 178   --  183  --   --   --   --   --    < > = values in this interval not displayed.   BMP &GFR Recent Labs  Lab 08/27/21 2210 08/28/21 0822 08/29/21 0356  NA 135 137 135  K 4.4 3.8 3.9  CL 101 103 103  CO2 _1 GLUCOSE 129* 116* 121*  BUN _2 CREATININE 1.33* 1.47* 1.51*  CALCIUM 8.9 8.6* 8.7*  MG  --   --  2.0  PHOS  --   --  3.2  Estimated Creatinine Clearance: 54.4 mL/min (A) (by C-G formula based on SCr of 1.51 mg/dL (H)). Liver & Pancreas: Recent Labs  Lab 08/27/21 2210 08/29/21 0356  AST 14*  --   ALT 10  --   ALKPHOS 58  --   BILITOT 0.8  --   PROT 7.2  --   ALBUMIN 3.5 3.3*   No results for input(s): LIPASE, AMYLASE in the last 168 hours. No results for input(s): AMMONIA in the last 168 hours. Diabetic: No results for input(s): HGBA1C in the last 72 hours. No results for input(s): GLUCAP in the last 168 hours. Cardiac Enzymes: No results for input(s): CKTOTAL, CKMB, CKMBINDEX, TROPONINI in the last 168 hours. No results for input(s): PROBNP in the last 8760 hours. Coagulation Profile: No results for input(s): INR, PROTIME in the last 168 hours. Thyroid Function Tests: No results for input(s): TSH, T4TOTAL, FREET4, T3FREE, THYROIDAB in the last 72 hours. Lipid Profile: No results for input(s): CHOL, HDL, LDLCALC, TRIG, CHOLHDL, LDLDIRECT in the last 72 hours. Anemia Panel: Recent Labs    08/28/21 1328  VITAMINB12 246  FOLATE 11.5  FERRITIN 5*  TIBC 405  IRON 23*  RETICCTPCT 4.0*   Urine analysis:    Component Value Date/Time   COLORURINE yellow 05/14/2010 0900   APPEARANCEUR Clear 05/14/2010 0900   LABSPEC <1.005 05/14/2010 0900   PHURINE 6.0 05/14/2010 0900   HGBUR large 05/14/2010 0900   BILIRUBINUR negative 02/17/2021 1139   PROTEINUR Negative 02/17/2021 1139   UROBILINOGEN 0.2 02/17/2021 1139   UROBILINOGEN 0.2 05/14/2010 0900   NITRITE positive 02/17/2021 1139   NITRITE positive 05/14/2010 0900   LEUKOCYTESUR Large (3+) (A)  02/17/2021 1139   Sepsis Labs: Invalid input(s): PROCALCITONIN, [place]  Microbiology: Recent Results (from the past 240 hour(s))  Resp Panel by RT-PCR (Flu A&B, Covid) Nasopharyngeal Swab     Status: None   Collection Time: 08/27/21  9:42 PM   Specimen: Nasopharyngeal Swab; Nasopharyngeal(NP) swabs in vial transport medium  Result Value Ref Range Status   SARS Coronavirus 2 by RT PCR NEGATIVE NEGATIVE Final    Comment: (NOTE) SARS-CoV-2 target nucleic acids are NOT DETECTED.  The SARS-CoV-2 RNA is generally detectable in upper respiratory specimens during the acute phase of infection. The lowest concentration of SARS-CoV-2 viral copies this assay can detect is 138 copies/mL. A negative result does not preclude SARS-Cov-2 infection and should not be used as the sole basis for treatment or other patient management decisions. A negative result may occur with  improper specimen collection/handling, submission of specimen other than nasopharyngeal swab, presence of viral mutation(s) within the areas targeted by this assay, and inadequate number of viral copies(<138 copies/mL). A negative result must be combined with clinical observations, patient history, and epidemiological information. The expected result is Negative.  Fact Sheet for Patients:  EntrepreneurPulse.com.au  Fact Sheet for Healthcare Providers:  IncredibleEmployment.be  This test is no t yet approved or cleared by the [place] FDA and  has been authorized for detection and/or diagnosis of SARS-CoV-2 by FDA under an Emergency Use Authorization (EUA). This EUA will remain  in effect (meaning this test can be used) for the duration of the COVID-19 declaration under Section 564(b)(1) of the Act, 21 U.S.C.section 360bbb-3(b)(1), unless the authorization is terminated  or revoked sooner.       Influenza A by PCR NEGATIVE NEGATIVE Final   Influenza B by PCR NEGATIVE NEGATIVE  Final    Comment: (NOTE) The Xpert Xpress SARS-CoV-2/FLU/RSV plus assay  is intended as an aid in the diagnosis of influenza from Nasopharyngeal swab specimens and should not be used as a sole basis for treatment. Nasal washings and aspirates are unacceptable for Xpert Xpress SARS-CoV-2/FLU/RSV testing.  Fact Sheet for Patients: EntrepreneurPulse.com.au  Fact Sheet for Healthcare Providers: IncredibleEmployment.be  This test is not yet approved or cleared by the [place] FDA and has been authorized for detection and/or diagnosis of SARS-CoV-2 by FDA under an Emergency Use Authorization (EUA). This EUA will remain in effect (meaning this test can be used) for the duration of the COVID-19 declaration under Section 564(b)(1) of the Act, 21 U.S.C. section 360bbb-3(b)(1), unless the authorization is terminated or revoked.  Performed at [facility], [place] [address], [place], [place] [address]     Radiology Studies: No results found.     Jarid Sasso T. Trooper  If 7PM-7AM, please contact night-coverage [email or web address] 08/30/2021, 1:40 PM

## 2021-08-31 ENCOUNTER — Encounter (HOSPITAL_COMMUNITY)
Admission: EM | Disposition: A | Discharge status: Home / Self Care | Payer: Self-pay | Source: Home / Self Care | Attending: Student

## 2021-08-31 DIAGNOSIS — D500 Iron deficiency anemia secondary to blood loss (chronic): Secondary | ICD-10-CM | POA: Diagnosis not present

## 2021-08-31 DIAGNOSIS — K31811 Angiodysplasia of stomach and duodenum with bleeding: Secondary | ICD-10-CM | POA: Diagnosis not present

## 2021-08-31 DIAGNOSIS — K921 Melena: Secondary | ICD-10-CM | POA: Diagnosis not present

## 2021-08-31 DIAGNOSIS — R195 Other fecal abnormalities: Secondary | ICD-10-CM | POA: Diagnosis not present

## 2021-08-31 DIAGNOSIS — K922 Gastrointestinal hemorrhage, unspecified: Secondary | ICD-10-CM

## 2021-08-31 DIAGNOSIS — D62 Acute posthemorrhagic anemia: Secondary | ICD-10-CM | POA: Diagnosis not present

## 2021-08-31 DIAGNOSIS — I2510 Atherosclerotic heart disease of native coronary artery without angina pectoris: Secondary | ICD-10-CM | POA: Diagnosis not present

## 2021-08-31 LAB — RENAL FUNCTION PANEL
Albumin: 3 g/dL — ABNORMAL LOW (ref 3.5–5.0)
Anion gap: 8 (ref 5–15)
BUN: 11 mg/dL (ref 8–23)
CO2: 25 mmol/L (ref 22–32)
Calcium: 8.4 mg/dL — ABNORMAL LOW (ref 8.9–10.3)
Chloride: 105 mmol/L (ref 98–111)
Creatinine, Ser: 1.37 mg/dL — ABNORMAL HIGH (ref 0.61–1.24)
GFR, Estimated: 52 mL/min — ABNORMAL LOW (ref >60)
Glucose, Bld: 106 mg/dL — ABNORMAL HIGH (ref 70–99)
Phosphorus: 3.6 mg/dL (ref 2.5–4.6)
Potassium: 4 mmol/L (ref 3.5–5.1)
Sodium: 138 mmol/L (ref 135–145)

## 2021-08-31 LAB — BPAM RBC
Blood Product Expiration Date: 202212122359
Blood Product Expiration Date: 202212212359
ISSUE DATE / TIME: 202211240248
ISSUE DATE / TIME: 202211261102
Unit Type and Rh: 5100
Unit Type and Rh: 5100

## 2021-08-31 LAB — TYPE AND SCREEN
ABO/RH(D): O POS
Antibody Screen: NEGATIVE
Unit division: 0
Unit division: 0

## 2021-08-31 LAB — HEMOGLOBIN AND HEMATOCRIT, BLOOD
HCT: 28.3 % — ABNORMAL LOW (ref 39.0–52.0)
HCT: 31.4 % — ABNORMAL LOW (ref 39.0–52.0)
Hemoglobin: 8.6 g/dL — ABNORMAL LOW (ref 13.0–17.0)
Hemoglobin: 9.2 g/dL — ABNORMAL LOW (ref 13.0–17.0)

## 2021-08-31 LAB — MAGNESIUM: Magnesium: 2.3 mg/dL (ref 1.7–2.4)

## 2021-08-31 SURGERY — COLONOSCOPY WITH PROPOFOL
Anesthesia: Monitor Anesthesia Care

## 2021-08-31 NOTE — Progress Notes (Signed)
     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 He is HIGH RISK for endoscopy given his advanced age, comorbidities, and chronic antiplatelet therapy. Cardiology has cleared him for endoscopy. He is completing a 5 day Plavix washout with the last dose 08/26/21.    PLAN: - Clear liquid diet today until bowel prep orders start - Empiric PPI IV BID - Continue to hold Plavix, OK to continue ASA although would avoid NSAIDs as able - Serial hgb/hct with transfusion as indicated - EGD and colonoscopy tomorrow 09/01/21  Subjective: Comfortable this morning. Overall feeling better. No overt bleeding. No GI complaints today.  No family present at the time of my evaluation.   Objective:  Vital signs in last 24 hours: Temp:  [97.4 F (36.3 C)-98.3 F (36.8 C)] 98.3 F (36.8 C) (11/27 0919) Pulse Rate:  [61-74] 68 (11/27 0919) Resp:  [15-18] 16 (11/27 0919) BP: (97-126)/(50-67) 126/62 (11/27 0919) SpO2:  [93 %-96 %] 94 % (11/27 0919) Weight:  [111.5 kg] 111.5 kg (11/27 0306) Last BM Date: 08/28/21 General:   Alert, in NAD Heart:  Regular rate and rhythm Pulm: Clear anteriorly; no wheezing Abdomen:  Soft. Nontender. Nondistended. Normal bowel sounds. No rebound or guarding. LAD: No inguinal or umbilical LAD Extremities:  Without edema. Neurologic:  Alert and  oriented x4;  grossly normal neurologically. Psych:  Alert and cooperative. Normal mood and affect.   Lab Results: Recent Labs     08/29/21 0356 08/29/21 0837 08/30/21 1108 08/30/21 1635 08/31/21 0446  WBC 4.7  --   --   --   --   HGB 8.0*   < > 7.6* 8.6* 8.6*  HCT 27.1*   < > 25.6* 29.4* 28.3*  PLT 183  --   --   --   --    < > = values in this interval not displayed.   BMET Recent Labs    08/29/21 0356 08/31/21 0446  NA 135 138  K 3.9 4.0  CL 103 105  CO2 25 25  GLUCOSE 121* 106*  BUN 17 11  CREATININE 1.51* 1.37*  CALCIUM 8.7* 8.4*   LFT Recent Labs    08/31/21 0446  ALBUMIN 3.0*      LOS: 3 days   Thornton Park  08/31/2021, 9:59 AM

## 2021-08-31 NOTE — Anesthesia Preprocedure Evaluation (Addendum)
Anesthesia Evaluation  Patient identified by MRN, date of birth, ID band Patient awake    Reviewed: Allergy & Precautions, NPO status , Patient's Chart, lab work & pertinent test results  Airway Mallampati: II  TM Distance: >3 FB Neck ROM: Full    Dental  (+) Partial Upper, Poor Dentition,    Pulmonary former smoker,    Pulmonary exam normal breath sounds clear to auscultation       Cardiovascular Exercise Tolerance: Good hypertension, + CAD and + CABG  + Valvular Problems/Murmurs (mild AS) AS  Rhythm:Regular Rate:Tachycardia  GEZ:MOQHUT sinus rhythm ST & T wave abnormality, consider anterior ischemia Abnormal ECG lateral ST depressions Confirmed by Ezequiel Essex [id number]) on 08/27/2021 11:03:21 PM  ECHO: 08/28/21 1. Left ventricular ejection fraction, by estimation, is 60 to 65%. The  left ventricle has normal function. The left ventricle has no regional  wall motion abnormalities. Left ventricular diastolic parameters were  normal.  2. Right ventricular systolic function is mildly reduced. The right  ventricular size is mildly enlarged.  3. Left atrial size was moderately dilated.  4. The mitral valve is abnormal. Mild mitral valve regurgitation. No  evidence of mitral stenosis.  5. Tricuspid valve regurgitation is moderate.  6. The aortic valve is tricuspid. There is moderate calcification of the  aortic valve. There is moderate thickening of the aortic valve. Aortic  valve regurgitation is not visualized. Mild aortic valve stenosis.  7. The inferior vena cava is normal in size with greater than 50%  respiratory variability, suggesting right atrial pressure of 3 mmHg.   Neuro/Psych  Neuromuscular disease (peripheral neuropathy) negative psych ROS   GI/Hepatic negative GI ROS, Neg liver ROS,   Endo/Other  negative endocrine ROS  Renal/GU negative Renal ROS     Musculoskeletal Chronic back pain    Abdominal   Peds  Hematology  (+) anemia ,   Anesthesia Other Findings   Reproductive/Obstetrics                            Anesthesia Physical Anesthesia Plan  ASA: 3  Anesthesia Plan: MAC   Post-op Pain Management:    Induction: Intravenous  PONV Risk Score and Plan: 1 and Propofol infusion, TIVA and Treatment may vary due to age or medical condition  Airway Management Planned: Natural Airway and Simple Face Mask  Additional Equipment: None  Intra-op Plan:   Post-operative Plan:   Informed Consent: I have reviewed the patients History and Physical, chart, labs and discussed the procedure including the risks, benefits and alternatives for the proposed anesthesia with the patient or authorized representative who has indicated his/her understanding and acceptance.     Dental advisory given  Plan Discussed with: Anesthesiologist and CRNA  Anesthesia Plan Comments: (No nausea/vomiting. OK for MAC. Norton Blizzard, MD  )       Anesthesia Quick Evaluation

## 2021-08-31 NOTE — Progress Notes (Signed)
Mobility Specialist Progress Note:   08/31/21 1300  Mobility  Activity Ambulated in hall  Level of Assistance Modified independent, requires aide device or extra time  Distance Ambulated (ft) 280 ft  Mobility Ambulated with assistance in hallway  Mobility Response Tolerated well  Mobility performed by Mobility specialist  $Mobility charge 1 Mobility   Pt received in bed willing to participate in mobility. No complaints of pain. Pt returned to bed with call bell in reach and all needs met.   [facility] Public librarian Phone [phone] Secondary Phone [phone]

## 2021-08-31 NOTE — Progress Notes (Signed)
PROGRESS NOTE  Rodney Barnett:[id number] DOB: Aug 22, 1941   PCP: [name], MD  Patient is from: Home.  DOA: 08/27/2021 LOS: 3  Chief complaints:  Chief Complaint  Patient presents with   Abnormal Lab     Brief Narrative / Interim history: 80 year old M with PMH of CAD/CABG in 1994, bladder cancer s/p TURBT and chemo in 2019, CKD-3A, chronic back pain, HTN and HLD directed to ED by his cardiologist for low hemoglobin.  Patient had a lab work for his upcoming heart catheterization on 08/29/2021, and his Hgb was 7.6 from 14.3 in 08/2020.  He had generalized weakness, fatigue, DOE for 1 week and melena for 2 weeks.  He is on Plavix and aspirin.  Also takes Goody powder occasionally.  On arrival to ED, he was HDS.  Hgb 7.5.  Hemoccult positive.  Transfused 1 unit with minimal response.   GI to scope after Plavix washout.  Last dose was on 11/22  Subjective: Seen and examined earlier this morning.  No major events overnight of this morning.  Had a bowel movements last night.  Looks to going that he attributes to the green beans he had.  He denies melena or hematochezia.  He denies chest pain, dyspnea, dizziness or palpitation. Objective: Vitals:   08/30/21 2046 08/31/21 0306 08/31/21 0919 08/31/21 1154  BP: 120/62 (!) 103/50 126/62 (!) 120/92  Pulse: 74 71 68 64  Resp: _0 Temp: (!) 97.4 F (36.3 C) 98 F (36.7 C) 98.3 F (36.8 C) 98.2 F (36.8 C)  TempSrc: Oral Oral Oral Oral  SpO2: 94% 93% 94% 96%  Weight:  111.5 kg    Height:        Intake/Output Summary (Last 24 hours) at 08/31/2021 1250 Last data filed at 08/31/2021 0807 Gross per 24 hour  Intake 1638.66 ml  Output 1100 ml  Net 538.66 ml   Filed Weights   08/29/21 0321 08/30/21 0419 08/31/21 0306  Weight: 112.3 kg 112.3 kg 111.5 kg    Examination:  GENERAL: No apparent distress.  Nontoxic. HEENT: MMM.  Vision and hearing grossly intact.  NECK: Supple.  No apparent JVD.  RESP: 93% on RA.  No  IWOB.  Fair aeration bilaterally. CVS:  RRR. Heart sounds normal.  ABD/GI/GU: BS+. Abd soft, NTND.  MSK/EXT:  Moves extremities. No apparent deformity. No edema.  SKIN: no apparent skin lesion or wound NEURO: Awake and alert. Oriented appropriately.  No apparent focal neuro deficit. PSYCH: Calm. Normal affect.   Procedures:  None  Microbiology summarized: [id number] and influenza PCR nonreactive.  Assessment & Plan: Symptomatic acute on chronic blood loss anemia likely due to upper GI bleed: Hgb 7.5 (14.3 in 08/2020).  Patient endorses generalized weakness, fatigue, DOE for 1 week and melena for 2 weeks.  On Plavix and aspirin.  Also reports using Goody powders occasionally.  BUN within normal favoring chronic bleed.  Anemia panel with severe iron deficiency.  Transfused 2 units so far. Recent Labs    08/28/21 0822 08/28/21 1701 08/29/21 0356 08/29/21 0837 08/29/21 1634 08/29/21 2235 08/30/21 0543 08/30/21 1108 08/30/21 1635 08/31/21 0446  HGB 8.1* 8.0* 8.0* 8.0* 7.6* 7.2* 7.7* 7.6* 8.6* 8.6*  -IV ferric gluconate 250 mg on 11/25 and 11/27 -Monitor H&H every 12 hours. -Continue IV Protonix 40 mg twice daily -Plan for EGD and colonoscopy on 11/28. -Continue holding Plavix  -Okay to continue aspirin per GI -Advised against NSAID moving forward -Transfuse for Hgb<8.0 given history of  CAD.  History of CAD/CABG in 1994-seen by cardiologist yesterday for DOE which could be likely from anemia at this point.  He is on Plavix and aspirin.  TTE reassuring. -Cardiology following. -Continue holding Plavix -Continue aspirin -Continue home Imdur, Toprol and Crestor.  CKD-3A: Stable. Recent Labs    08/20/21 1341 08/27/21 2210 08/28/21 0822 08/29/21 0356 08/31/21 0446  BUN 24* _0 CREATININE 1.41 1.33* 1.47* 1.51* 1.37*  -Continue monitoring -Continue holding lisinopril  Chronic back pain/neuropathic pain -Continue home Norco at reduced dose -Increased night dose  gabapentin to 900 mg on 11/25  Essential hypertension: Normotensive for most part. -Continue home Imdur and metoprolol -Hold home lisinopril  History of bladder cancer s/p TURBT and chemo in 2019. -Outpatient follow-up   Class I obesity Body mass index is 29.93 kg/m.         DVT prophylaxis:  SCDs Start: 08/28/21 0159  Code Status: Full code Family Communication: Patient and/or RN. Available if any question.  Level of care: Telemetry Medical Status is: Inpatient  Remains inpatient appropriate because: Evaluation and treatment of acute on chronic blood loss anemia due to GI bleed requiring blood transfusion.  Patient needs EGD and colonoscopy after Plavix washout.       Consultants:  Gastroenterology Cardiology   Sch Meds:  Scheduled Meds:  gabapentin  600 mg Oral BID WC   And   gabapentin  900 mg Oral QHS   isosorbide mononitrate  30 mg Oral Daily   metoprolol tartrate  25 mg Oral BID   pantoprazole  40 mg Intravenous Q12H   peg 3350 powder  0.5 kit Oral Once   And   peg 3350 powder  0.5 kit Oral Once   rosuvastatin  20 mg Oral QHS   Continuous Infusions:  sodium chloride Stopped (08/29/21 1301)   PRN Meds:.acetaminophen **OR** acetaminophen, HYDROcodone-acetaminophen, ondansetron **OR** ondansetron (ZOFRAN) IV, senna-docusate  Antimicrobials: Anti-infectives (From admission, onward)    None        I have personally reviewed the following labs and images: CBC: Recent Labs  Lab 08/27/21 2210 08/28/21 0822 08/28/21 1701 08/29/21 0356 08/29/21 0837 08/29/21 2235 08/30/21 0543 08/30/21 1108 08/30/21 1635 08/31/21 0446  WBC 6.3 4.2  --  4.7  --   --   --   --   --   --   NEUTROABS 3.8  --   --   --   --   --   --   --   --   --   HGB 7.5* 8.1*   < > 8.0*   < > 7.2* 7.7* 7.6* 8.6* 8.6*  HCT 26.2* 28.3*   < > 27.1*   < > 24.1* 25.5* 25.6* 29.4* 28.3*  MCV 87.9 88.2  --  86.3  --   --   --   --   --   --   PLT 213 178  --  183  --   --   --    --   --   --    < > = values in this interval not displayed.   BMP &GFR Recent Labs  Lab 08/27/21 2210 08/28/21 0822 08/29/21 0356 08/31/21 0446  NA 135 137 135 138  K 4.4 3.8 3.9 4.0  CL 101 103 103 105  CO2 _1 GLUCOSE 129* 116* 121* 106*  BUN _2 CREATININE 1.33* 1.47* 1.51* 1.37*  CALCIUM 8.9 8.6* 8.7* 8.4*  MG  --   --  2.0 2.3  PHOS  --   --  3.2 3.6   Estimated Creatinine Clearance: 59.8 mL/min (A) (by C-G formula based on SCr of 1.37 mg/dL (H)). Liver & Pancreas: Recent Labs  Lab 08/27/21 2210 08/29/21 0356 08/31/21 0446  AST 14*  --   --   ALT 10  --   --   ALKPHOS 58  --   --   BILITOT 0.8  --   --   PROT 7.2  --   --   ALBUMIN 3.5 3.3* 3.0*   No results for input(s): LIPASE, AMYLASE in the last 168 hours. No results for input(s): AMMONIA in the last 168 hours. Diabetic: No results for input(s): HGBA1C in the last 72 hours. No results for input(s): GLUCAP in the last 168 hours. Cardiac Enzymes: No results for input(s): CKTOTAL, CKMB, CKMBINDEX, TROPONINI in the last 168 hours. No results for input(s): PROBNP in the last 8760 hours. Coagulation Profile: No results for input(s): INR, PROTIME in the last 168 hours. Thyroid Function Tests: No results for input(s): TSH, T4TOTAL, FREET4, T3FREE, THYROIDAB in the last 72 hours. Lipid Profile: No results for input(s): CHOL, HDL, LDLCALC, TRIG, CHOLHDL, LDLDIRECT in the last 72 hours. Anemia Panel: Recent Labs    08/28/21 1328  VITAMINB12 246  FOLATE 11.5  FERRITIN 5*  TIBC 405  IRON 23*  RETICCTPCT 4.0*   Urine analysis:    Component Value Date/Time   COLORURINE yellow 05/14/2010 0900   APPEARANCEUR Clear 05/14/2010 0900   LABSPEC <1.005 05/14/2010 0900   PHURINE 6.0 05/14/2010 0900   HGBUR large 05/14/2010 0900   BILIRUBINUR negative 02/17/2021 1139   PROTEINUR Negative 02/17/2021 1139   UROBILINOGEN 0.2 02/17/2021 1139   UROBILINOGEN 0.2 05/14/2010 0900   NITRITE positive  02/17/2021 1139   NITRITE positive 05/14/2010 0900   LEUKOCYTESUR Large (3+) (A) 02/17/2021 1139   Sepsis Labs: Invalid input(s): PROCALCITONIN, [place]  Microbiology: Recent Results (from the past 240 hour(s))  Resp Panel by RT-PCR (Flu A&B, Covid) Nasopharyngeal Swab     Status: None   Collection Time: 08/27/21  9:42 PM   Specimen: Nasopharyngeal Swab; Nasopharyngeal(NP) swabs in vial transport medium  Result Value Ref Range Status   SARS Coronavirus 2 by RT PCR NEGATIVE NEGATIVE Final    Comment: (NOTE) SARS-CoV-2 target nucleic acids are NOT DETECTED.  The SARS-CoV-2 RNA is generally detectable in upper respiratory specimens during the acute phase of infection. The lowest concentration of SARS-CoV-2 viral copies this assay can detect is 138 copies/mL. A negative result does not preclude SARS-Cov-2 infection and should not be used as the sole basis for treatment or other patient management decisions. A negative result may occur with  improper specimen collection/handling, submission of specimen other than nasopharyngeal swab, presence of viral mutation(s) within the areas targeted by this assay, and inadequate number of viral copies(<138 copies/mL). A negative result must be combined with clinical observations, patient history, and epidemiological information. The expected result is Negative.  Fact Sheet for Patients:  EntrepreneurPulse.com.au  Fact Sheet for Healthcare Providers:  IncredibleEmployment.be  This test is no t yet approved or cleared by the [place] FDA and  has been authorized for detection and/or diagnosis of SARS-CoV-2 by FDA under an Emergency Use Authorization (EUA). This EUA will remain  in effect (meaning this test can be used) for the duration of the COVID-19 declaration under Section 564(b)(1) of the Act, 21 U.S.C.section 360bbb-3(b)(1), unless the authorization is terminated  or revoked sooner.  Influenza A by PCR NEGATIVE NEGATIVE Final   Influenza B by PCR NEGATIVE NEGATIVE Final    Comment: (NOTE) The Xpert Xpress SARS-CoV-2/FLU/RSV plus assay is intended as an aid in the diagnosis of influenza from Nasopharyngeal swab specimens and should not be used as a sole basis for treatment. Nasal washings and aspirates are unacceptable for Xpert Xpress SARS-CoV-2/FLU/RSV testing.  Fact Sheet for Patients: EntrepreneurPulse.com.au  Fact Sheet for Healthcare Providers: IncredibleEmployment.be  This test is not yet approved or cleared by the [place] FDA and has been authorized for detection and/or diagnosis of SARS-CoV-2 by FDA under an Emergency Use Authorization (EUA). This EUA will remain in effect (meaning this test can be used) for the duration of the COVID-19 declaration under Section 564(b)(1) of the Act, 21 U.S.C. section 360bbb-3(b)(1), unless the authorization is terminated or revoked.  Performed at [facility], [place] [address], [place], [place] [address]     Radiology Studies: No results found.     Moraima Burd T. Tower  If 7PM-7AM, please contact night-coverage [email or web address] 08/31/2021, 12:50 PM

## 2021-08-31 NOTE — H&P (View-Only) (Signed)
     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 He is HIGH RISK for endoscopy given his advanced age, comorbidities, and chronic antiplatelet therapy. Cardiology has cleared him for endoscopy. He is completing a 5 day Plavix washout with the last dose 08/26/21.    PLAN: - Clear liquid diet today until bowel prep orders start - Empiric PPI IV BID - Continue to hold Plavix, OK to continue ASA although would avoid NSAIDs as able - Serial hgb/hct with transfusion as indicated - EGD and colonoscopy tomorrow 09/01/21  Subjective: Comfortable this morning. Overall feeling better. No overt bleeding. No GI complaints today.  No family present at the time of my evaluation.   Objective:  Vital signs in last 24 hours: Temp:  [97.4 F (36.3 C)-98.3 F (36.8 C)] 98.3 F (36.8 C) (11/27 0919) Pulse Rate:  [61-74] 68 (11/27 0919) Resp:  [15-18] 16 (11/27 0919) BP: (97-126)/(50-67) 126/62 (11/27 0919) SpO2:  [93 %-96 %] 94 % (11/27 0919) Weight:  [111.5 kg] 111.5 kg (11/27 0306) Last BM Date: 08/28/21 General:   Alert, in NAD Heart:  Regular rate and rhythm Pulm: Clear anteriorly; no wheezing Abdomen:  Soft. Nontender. Nondistended. Normal bowel sounds. No rebound or guarding. LAD: No inguinal or umbilical LAD Extremities:  Without edema. Neurologic:  Alert and  oriented x4;  grossly normal neurologically. Psych:  Alert and cooperative. Normal mood and affect.   Lab Results: Recent Labs     08/29/21 0356 08/29/21 0837 08/30/21 1108 08/30/21 1635 08/31/21 0446  WBC 4.7  --   --   --   --   HGB 8.0*   < > 7.6* 8.6* 8.6*  HCT 27.1*   < > 25.6* 29.4* 28.3*  PLT 183  --   --   --   --    < > = values in this interval not displayed.   BMET Recent Labs    08/29/21 0356 08/31/21 0446  NA 135 138  K 3.9 4.0  CL 103 105  CO2 25 25  GLUCOSE 121* 106*  BUN 17 11  CREATININE 1.51* 1.37*  CALCIUM 8.7* 8.4*   LFT Recent Labs    08/31/21 0446  ALBUMIN 3.0*      LOS: 3 days   Thornton Park  08/31/2021, 9:59 AM

## 2021-09-01 ENCOUNTER — Ambulatory Visit (HOSPITAL_BASED_OUTPATIENT_CLINIC_OR_DEPARTMENT_OTHER): Admission: RE | Admit: 2021-09-01 | Payer: Medicare Other | Source: Ambulatory Visit

## 2021-09-01 ENCOUNTER — Inpatient Hospital Stay (HOSPITAL_COMMUNITY): Payer: Medicare Other | Admitting: Anesthesiology

## 2021-09-01 ENCOUNTER — Telehealth: Payer: Self-pay

## 2021-09-01 ENCOUNTER — Encounter (HOSPITAL_COMMUNITY): Payer: Self-pay | Admitting: Family Medicine

## 2021-09-01 ENCOUNTER — Encounter (HOSPITAL_COMMUNITY)
Admission: EM | Disposition: A | Discharge status: Home / Self Care | Payer: Self-pay | Source: Home / Self Care | Attending: Student

## 2021-09-01 DIAGNOSIS — K31819 Angiodysplasia of stomach and duodenum without bleeding: Secondary | ICD-10-CM

## 2021-09-01 DIAGNOSIS — Z8551 Personal history of malignant neoplasm of bladder: Secondary | ICD-10-CM

## 2021-09-01 DIAGNOSIS — K922 Gastrointestinal hemorrhage, unspecified: Secondary | ICD-10-CM

## 2021-09-01 DIAGNOSIS — I998 Other disorder of circulatory system: Secondary | ICD-10-CM

## 2021-09-01 DIAGNOSIS — D500 Iron deficiency anemia secondary to blood loss (chronic): Secondary | ICD-10-CM

## 2021-09-01 DIAGNOSIS — K5730 Diverticulosis of large intestine without perforation or abscess without bleeding: Secondary | ICD-10-CM

## 2021-09-01 DIAGNOSIS — K31811 Angiodysplasia of stomach and duodenum with bleeding: Secondary | ICD-10-CM | POA: Diagnosis not present

## 2021-09-01 DIAGNOSIS — D649 Anemia, unspecified: Secondary | ICD-10-CM

## 2021-09-01 DIAGNOSIS — M549 Dorsalgia, unspecified: Secondary | ICD-10-CM

## 2021-09-01 DIAGNOSIS — D62 Acute posthemorrhagic anemia: Secondary | ICD-10-CM | POA: Diagnosis not present

## 2021-09-01 DIAGNOSIS — I129 Hypertensive chronic kidney disease with stage 1 through stage 4 chronic kidney disease, or unspecified chronic kidney disease: Secondary | ICD-10-CM | POA: Diagnosis not present

## 2021-09-01 DIAGNOSIS — K921 Melena: Secondary | ICD-10-CM | POA: Diagnosis not present

## 2021-09-01 DIAGNOSIS — K635 Polyp of colon: Secondary | ICD-10-CM | POA: Diagnosis not present

## 2021-09-01 DIAGNOSIS — D120 Benign neoplasm of cecum: Secondary | ICD-10-CM

## 2021-09-01 DIAGNOSIS — I248 Other forms of acute ischemic heart disease: Secondary | ICD-10-CM | POA: Diagnosis not present

## 2021-09-01 DIAGNOSIS — N1831 Chronic kidney disease, stage 3a: Secondary | ICD-10-CM | POA: Diagnosis not present

## 2021-09-01 DIAGNOSIS — Z951 Presence of aortocoronary bypass graft: Secondary | ICD-10-CM

## 2021-09-01 DIAGNOSIS — K641 Second degree hemorrhoids: Secondary | ICD-10-CM

## 2021-09-01 DIAGNOSIS — M792 Neuralgia and neuritis, unspecified: Secondary | ICD-10-CM

## 2021-09-01 DIAGNOSIS — D124 Benign neoplasm of descending colon: Secondary | ICD-10-CM | POA: Diagnosis not present

## 2021-09-01 DIAGNOSIS — G8929 Other chronic pain: Secondary | ICD-10-CM

## 2021-09-01 DIAGNOSIS — Z8679 Personal history of other diseases of the circulatory system: Secondary | ICD-10-CM

## 2021-09-01 DIAGNOSIS — Z20822 Contact with and (suspected) exposure to covid-19: Secondary | ICD-10-CM | POA: Diagnosis not present

## 2021-09-01 DIAGNOSIS — D509 Iron deficiency anemia, unspecified: Secondary | ICD-10-CM

## 2021-09-01 DIAGNOSIS — I2510 Atherosclerotic heart disease of native coronary artery without angina pectoris: Secondary | ICD-10-CM | POA: Diagnosis not present

## 2021-09-01 HISTORY — PX: ESOPHAGOGASTRODUODENOSCOPY (EGD) WITH PROPOFOL: SHX5813

## 2021-09-01 HISTORY — PX: SUBMUCOSAL LIFTING INJECTION: SHX6855

## 2021-09-01 HISTORY — PX: HEMOSTASIS CLIP PLACEMENT: SHX6857

## 2021-09-01 HISTORY — PX: ENDOSCOPIC MUCOSAL RESECTION: SHX6839

## 2021-09-01 HISTORY — DX: Other disorder of circulatory system: I99.8

## 2021-09-01 HISTORY — PX: POLYPECTOMY: SHX5525

## 2021-09-01 HISTORY — PX: COLONOSCOPY WITH PROPOFOL: SHX5780

## 2021-09-01 HISTORY — PX: HOT HEMOSTASIS: SHX5433

## 2021-09-01 LAB — RENAL FUNCTION PANEL
Albumin: 3.1 g/dL — ABNORMAL LOW (ref 3.5–5.0)
Anion gap: 7 (ref 5–15)
BUN: 8 mg/dL (ref 8–23)
CO2: 25 mmol/L (ref 22–32)
Calcium: 8.6 mg/dL — ABNORMAL LOW (ref 8.9–10.3)
Chloride: 105 mmol/L (ref 98–111)
Creatinine, Ser: 1.37 mg/dL — ABNORMAL HIGH (ref 0.61–1.24)
GFR, Estimated: 52 mL/min — ABNORMAL LOW (ref >60)
Glucose, Bld: 110 mg/dL — ABNORMAL HIGH (ref 70–99)
Phosphorus: 3 mg/dL (ref 2.5–4.6)
Potassium: 4.6 mmol/L (ref 3.5–5.1)
Sodium: 137 mmol/L (ref 135–145)

## 2021-09-01 LAB — HEMOGLOBIN AND HEMATOCRIT, BLOOD
HCT: 29.6 % — ABNORMAL LOW (ref 39.0–52.0)
Hemoglobin: 8.8 g/dL — ABNORMAL LOW (ref 13.0–17.0)

## 2021-09-01 LAB — MAGNESIUM: Magnesium: 2.1 mg/dL (ref 1.7–2.4)

## 2021-09-01 SURGERY — COLONOSCOPY WITH PROPOFOL
Anesthesia: Monitor Anesthesia Care

## 2021-09-01 MED ORDER — PANTOPRAZOLE SODIUM 40 MG PO TBEC: 40.0000 mg | DELAYED_RELEASE_TABLET | Freq: Every day | ORAL | 0 refills | Status: DC

## 2021-09-01 MED ORDER — EPHEDRINE SULFATE 50 MG/ML IJ SOLN
INTRAMUSCULAR | Status: DC | PRN
  Administered 2021-09-01 (×2): 5 mg via INTRAVENOUS

## 2021-09-01 MED ORDER — PHENYLEPHRINE HCL-NACL 20-0.9 MG/250ML-% IV SOLN
INTRAVENOUS | Status: DC | PRN
  Administered 2021-09-01: 40 ug/min via INTRAVENOUS

## 2021-09-01 MED ORDER — PROPOFOL 10 MG/ML IV BOLUS
INTRAVENOUS | Status: DC | PRN
  Administered 2021-09-01 (×2): 10 mg via INTRAVENOUS

## 2021-09-01 MED ORDER — PROPOFOL 500 MG/50ML IV EMUL
INTRAVENOUS | Status: DC | PRN
  Administered 2021-09-01: 125 ug/kg/min via INTRAVENOUS

## 2021-09-01 MED ORDER — LISINOPRIL 10 MG PO TABS: 10.0000 mg | ORAL_TABLET | Freq: Every day | ORAL | 0 refills | Status: DC

## 2021-09-01 MED ORDER — LACTATED RINGERS IV SOLN: INTRAVENOUS | Status: DC | PRN

## 2021-09-01 MED ORDER — PHENYLEPHRINE HCL (PRESSORS) 10 MG/ML IV SOLN
INTRAVENOUS | Status: DC | PRN
Start: 2021-09-01 — End: 2021-09-01
  Administered 2021-09-01: 80 ug via INTRAVENOUS
  Administered 2021-09-01: 40 ug via INTRAVENOUS
  Administered 2021-09-01 (×2): 80 ug via INTRAVENOUS
  Administered 2021-09-01: 40 ug via INTRAVENOUS
  Administered 2021-09-01: 60 ug via INTRAVENOUS

## 2021-09-01 SURGICAL SUPPLY — 25 items

## 2021-09-01 NOTE — Anesthesia Postprocedure Evaluation (Signed)
Anesthesia Post Note  Patient: Rodney Barnett [facility]  Procedure(s) Performed: COLONOSCOPY WITH PROPOFOL ESOPHAGOGASTRODUODENOSCOPY (EGD) WITH PROPOFOL ENDOSCOPIC MUCOSAL RESECTION POLYPECTOMY     Patient location during evaluation: Endoscopy Anesthesia Type: MAC Level of consciousness: awake and alert Pain management: pain level controlled Vital Signs Assessment: post-procedure vital signs reviewed and stable Respiratory status: spontaneous breathing, nonlabored ventilation and respiratory function stable Cardiovascular status: blood pressure returned to baseline and stable Postop Assessment: no apparent nausea or vomiting Anesthetic complications: no   No notable events documented.  Last Vitals:  Vitals:   09/01/21 0900 09/01/21 0915  BP: (!) 94/37 (!) 112/46  Pulse: 69 72  Resp: (!) 24 18  Temp: 36.6 C (!) 36.3 C  SpO2: 97% 98%    Last Pain:  Vitals:   09/01/21 0915  TempSrc:   PainSc: 0-No pain                 Merlinda Frederick

## 2021-09-01 NOTE — Discharge Summary (Signed)
Physician Discharge Summary  Rodney Barnett [facility] GGY:[id number] DOB: 15-May-1941 DOA: 08/27/2021  PCP: Rodney Post, MD  Admit date: 08/27/2021 Discharge date: 09/01/2021 Admitted From: Home Disposition: Home Recommendations for Outpatient Follow-up:  Follow ups as below. Please obtain CBC/BMP/Mag at follow up Please follow up on the following pending results: Gastric biopsy  Discharge Condition: Stable CODE STATUS: Full code  Follow-up Information     Rodney Post, MD. Go on 09/02/2021.   Specialty: Family Medicine Why: @3 :45pm Contact information: [place] [address] [phone]         Rodney Sark, MD .   Specialty: Cardiology Contact information: [place] [address] [phone]                Hospital Course: 80 year old M with PMH of CAD/CABG in 1994, bladder cancer s/p TURBT and chemo in 2019, CKD-3A, chronic back pain, HTN and HLD directed to ED by his cardiologist for low hemoglobin.  Patient had a lab work for his upcoming heart catheterization on 08/29/2021, and his Hgb was 7.6 from 14.3 in 08/2020.  He had generalized weakness, fatigue, DOE for 1 week and melena for 2 weeks.  He is on Plavix and aspirin. Also takes Goody powder occasionally.  On arrival to ED, he was HDS.  Hgb 7.5.  Hemoccult positive.  Anemia panel with iron deficiency.  Transfused 1 unit with minimal response, and hemoglobin dropped further.  Transfused additional unit with appropriate response.  He also received IV ferric gluconate 250 mg twice.  EGD-showed single nonbleeding AVM that was treated with APC and clip. Colonoscopy with 15 mL cecal polyp and 3 mm descending colon polyp that were resected and retrieved, sigmoid, transverse and ascending colon diverticulosis, nonbleeding internal hemorrhoids.   Per GI, "EGD/Colo done. EGD with single AVM, treated with cautery and a clip. Otherwise normal upper GI tract. Colo with 2  polyps, bith removed, but no bleeding areas. Will treat with high dose PPI x6 weeks for healing of treated gastric site. Ok to resume ASA 81 mg and per Cards notes will hold Plavix indefinitely. Ok to advance diet and anticipate ok for d/c today/tomorrow"  See individual problem list below for more on hospital course.  Discharge Diagnoses:  Symptomatic acute on chronic blood loss anemia likely due to upper GI bleed: Hgb 7.5 (14.3 in 08/2020).  Transfused 2 units received IV ferric gluconate 250 mg twice.  No further bleeding.  H&H stable.  EGD and colonoscopy as above. Recent Labs    08/29/21 0356 08/29/21 0837 08/29/21 1634 08/29/21 2235 08/30/21 0543 08/30/21 1108 08/30/21 1635 08/31/21 0446 08/31/21 1620 09/01/21 0456  HGB 8.0* 8.0* 7.6* 7.2* 7.7* 7.6* 8.6* 8.6* 9.2* 8.8*  -Discharged on p.o. Protonix 40 mg daily for 6 weeks -Plavix discontinued -Okay to resume aspirin per GI -Advised to avoid NSAIDs -Repeat CBC in 1 week   History of CAD/CABG in 1994-seen by cardiologist yesterday for DOE which could be likely from anemia at this point.  He is on Plavix and aspirin.  TTE reassuring. -Plavix discontinued indefinitely. -Continue home aspirin, Imdur, Toprol and Crestor.   CKD-3A: Stable. Recent Labs    08/20/21 1341 08/27/21 2210 08/28/21 0822 08/29/21 0356 08/31/21 0446 09/01/21 0456  BUN 24* 15 15 17 11 8   CREATININE 1.41 1.33* 1.47* 1.51* 1.37* 1.37*  -Recheck at follow-up. -Advised to hold lisinopril for 1 week   Chronic back pain/neuropathic pain -Continue home Norco and gabapentin  Essential hypertension: Normotensive for most part. -Continue home meds   History of bladder cancer s/p TURBT and chemo in 2019. -Outpatient follow-up  Body mass index is 29.58 kg/m.           Discharge Exam: Vitals:   09/01/21 0732 09/01/21 0900 09/01/21 0915 09/01/21 1216  BP: (!) 154/63 (!) 94/37 (!) 112/46 (!) 114/58  Pulse: 70 69 72 63  Temp: (!) 97.4 F (36.3  C) 97.9 F (36.6 C) (!) 97.4 F (36.3 C) (!) 97.5 F (36.4 C)  Resp: 18 (!) 24 18 20   Height: 6\' 4"  (1.93 m)     Weight: 110.2 kg     SpO2: 98% 97% 98% 93%  TempSrc:    Oral  BMI (Calculated): 29.59        GENERAL: No apparent distress.  Nontoxic. HEENT: MMM.  Vision and hearing grossly intact.  NECK: Supple.  No apparent JVD.  RESP: 93% on RA.  No IWOB.  Fair aeration bilaterally. CVS:  RRR. Heart sounds normal.  ABD/GI/GU: Bowel sounds present. Soft. Non tender.  MSK/EXT:  Moves extremities. No apparent deformity. No edema.  SKIN: no apparent skin lesion or wound NEURO: Awake and alert.  Oriented appropriately.  No apparent focal neuro deficit. PSYCH: Calm. Normal affect.   Discharge Instructions  Discharge Instructions     Call MD for:  difficulty breathing, headache or visual disturbances   Complete by: As directed    Call MD for:  extreme fatigue   Complete by: As directed    Call MD for:  persistant dizziness or light-headedness   Complete by: As directed    Diet - low sodium heart healthy   Complete by: As directed    Discharge instructions   Complete by: As directed    It has been a pleasure taking care of you!  You were hospitalized gastrointestinal bleeding for which you have been treated with blood transfusion.  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  It also showed some gastritis (irritation of your stomach).  Your colonoscopy showed 2 polyps that were resected and sent for pathology testing.  The gastroenterologist team will get in touch with you if the pathology result is concerning.  The gastroenterologist recommended taking acid reflux medication/Protonix for the next 6 weeks to reduce your risk of bleeding.  We have also stopped the Plavix.  Review your new medication list and the directions on your medications before you take them.  Continue watching  the stool for dark tarry stool or fresh red blood in stool.  Avoid any over-the-counter pain medications other than plain Tylenol. Follow-up with your primary care doctor and cardiologist in 1 to 2 weeks or sooner if needed.   Take care,   Increase activity slowly   Complete by: As directed       Allergies as of 09/01/2021       Reactions   Lipitor [atorvastatin] Other (See Comments)   myalgia        Medication List     STOP taking these medications    clopidogrel 75 MG tablet Commonly known as: PLAVIX       TAKE these medications    aspirin 81 MG tablet Take 81 mg by mouth daily.   Fish Oil 1000 MG Cpdr Take 1,000 mg by mouth daily.   gabapentin 300 MG capsule Commonly known as: NEURONTIN TAKE (2) CAPSULES BY MOUTH THREE TIMES  DAILY. What changed: See the new instructions.   HYDROcodone-acetaminophen 10-325 MG tablet Commonly known as: NORCO TAKE (1) TABLET BY MOUTH EVERY SIX HOURS AS NEEDED What changed:  how much to take how to take this when to take this reasons to take this additional instructions   isosorbide mononitrate 30 MG 24 hr tablet Commonly known as: IMDUR TAKE 1 TABLET ONCE DAILY. What changed: how to take this   lisinopril 10 MG tablet Commonly known as: ZESTRIL Take 1 tablet (10 mg total) by mouth daily. Start taking on: September 08, 2021 What changed: These instructions start on September 08, 2021. If you are unsure what to do until then, ask your doctor or other care provider.   metoprolol tartrate 25 MG tablet Commonly known as: LOPRESSOR TAKE (1) TABLET TWICE DAILY. What changed: See the new instructions.   nitroGLYCERIN 0.4 MG SL tablet Commonly known as: Nitrostat Place 1 tablet (0.4 mg total) under the tongue every 5 (five) minutes x 3 doses as needed for chest pain (If no relief after 3rd dose, proceed to the ED for an evaluation or call 911).   pantoprazole 40 MG tablet Commonly known as: Protonix Take 1 tablet (40 mg  total) by mouth daily.   rosuvastatin 20 MG tablet Commonly known as: CRESTOR TAKE 1 TABLET ONCE DAILY. What changed: when to take this        Consultations: Gastroenterology Cardiology  Procedures/Studies: EGD-showed single nonbleeding AVM that was treated with APC and clip Colonoscopy with 15 mL cecal polyp and 3 mm descending colon polyp that were resected and retrieved, sigmoid, transverse and ascending colon diverticulosis, nonbleeding internal hemorrhoids    DG Chest 2 View  Result Date: 08/27/2021 CLINICAL DATA:  Shortness of breath, weakness EXAM: CHEST - 2 VIEW.  Patient is slightly rotated on the frontal view. COMPARISON:  Chest x-ray 09/06/2009 FINDINGS: The heart and mediastinal contours are unchanged. Cardiac surgical changes overlie the mediastinum. Left base atelectasis. Slightly increased interstitial and airspace markings within the left lower lobe. No focal consolidation. Chronic coarsened interstitial markings with no overt pulmonary edema. No pleural effusion. No pneumothorax. No acute osseous abnormality. IMPRESSION: Chronic bronchitic changes with query superimposed retrocardiac infection/inflammation. Electronically Signed   By: Iven Finn M.D.   On: 08/27/2021 22:21   ECHOCARDIOGRAM COMPLETE  Result Date: 08/29/2021    ECHOCARDIOGRAM REPORT   Patient Name:   Rodney Barnett Date of Exam: 08/29/2021 Medical Rec #:  [id number]    Height:       76.0 in Accession #:    [id number]   Weight:       247.6 lb Date of Birth:  01-03-1941    BSA:          2.426 m Patient Age:    80 years     BP:           132/47 mmHg Patient Gender: M            HR:           70 bpm. Exam Location:  Inpatient Procedure: 2D Echo, Color Doppler and Cardiac Doppler Indications:    murmur  History:        Patient has no prior history of Echocardiogram examinations.                 CAD, Prior CABG; Risk Factors:Hypertension and Dyslipidemia.  Sonographer:    Melissa Morford RDCS (AE, PE)  Referring Phys: [id number] [place]  1. Left  ventricular ejection fraction, by estimation, is 60 to 65%. The left ventricle has normal function. The left ventricle has no regional wall motion abnormalities. Left ventricular diastolic parameters were normal.  2. Right ventricular systolic function is mildly reduced. The right ventricular size is mildly enlarged.  3. Left atrial size was moderately dilated.  4. The mitral valve is abnormal. Mild mitral valve regurgitation. No evidence of mitral stenosis.  5. Tricuspid valve regurgitation is moderate.  6. The aortic valve is tricuspid. There is moderate calcification of the aortic valve. There is moderate thickening of the aortic valve. Aortic valve regurgitation is not visualized. Mild aortic valve stenosis.  7. The inferior vena cava is normal in size with greater than 50% respiratory variability, suggesting right atrial pressure of 3 mmHg. FINDINGS  Left Ventricle: Left ventricular ejection fraction, by estimation, is 60 to 65%. The left ventricle has normal function. The left ventricle has no regional wall motion abnormalities. The left ventricular internal cavity size was normal in size. There is  no left ventricular hypertrophy. Left ventricular diastolic parameters were normal. Right Ventricle: The right ventricular size is mildly enlarged. Right vetricular wall thickness was not assessed. Right ventricular systolic function is mildly reduced. Left Atrium: Left atrial size was moderately dilated. Right Atrium: Right atrial size was normal in size. Pericardium: There is no evidence of pericardial effusion. Mitral Valve: The mitral valve is abnormal. There is mild thickening of the mitral valve leaflet(s). There is mild calcification of the mitral valve leaflet(s). Mild mitral annular calcification. Mild mitral valve regurgitation. No evidence of mitral valve stenosis. Tricuspid Valve: The tricuspid valve is normal in structure. Tricuspid  valve regurgitation is moderate . No evidence of tricuspid stenosis. Aortic Valve: The aortic valve is tricuspid. There is moderate calcification of the aortic valve. There is moderate thickening of the aortic valve. Aortic valve regurgitation is not visualized. Mild aortic stenosis is present. Aortic valve mean gradient measures 10.2 mmHg. Aortic valve peak gradient measures 16.6 mmHg. Aortic valve area, by VTI measures 1.65 cm. Pulmonic Valve: The pulmonic valve was normal in structure. Pulmonic valve regurgitation is mild. No evidence of pulmonic stenosis. Aorta: The aortic root is normal in size and structure. Venous: The inferior vena cava is normal in size with greater than 50% respiratory variability, suggesting right atrial pressure of 3 mmHg. IAS/Shunts: No atrial level shunt detected by color flow Doppler.  LEFT VENTRICLE PLAX 2D LVIDd:         5.00 cm   Diastology LVIDs:         3.20 cm   LV e' medial:    6.42 cm/s LV PW:         0.80 cm   LV E/e' medial:  12.7 LV IVS:        0.90 cm   LV e' lateral:   8.38 cm/s LVOT diam:     2.30 cm   LV E/e' lateral: 9.7 LV SV:         81 LV SV Index:   34 LVOT Area:     4.15 cm  RIGHT VENTRICLE RV S prime:     10.40 cm/s TAPSE (M-mode): 2.1 cm LEFT ATRIUM             Index        RIGHT ATRIUM           Index LA diam:        4.50 cm 1.86 cm/m   RA Area:  13.90 cm LA Vol (A2C):   60.3 ml 24.86 ml/m  RA Volume:   28.60 ml  11.79 ml/m LA Vol (A4C):   46.1 ml 19.01 ml/m LA Biplane Vol: 55.4 ml 22.84 ml/m  AORTIC VALVE AV Area (Vmax):    1.80 cm AV Area (Vmean):   1.65 cm AV Area (VTI):     1.65 cm AV Vmax:           203.50 cm/s AV Vmean:          150.750 cm/s AV VTI:            0.495 m AV Peak Grad:      16.6 mmHg AV Mean Grad:      10.2 mmHg LVOT Vmax:         88.30 cm/s LVOT Vmean:        59.900 cm/s LVOT VTI:          0.196 m LVOT/AV VTI ratio: 0.40  AORTA Ao Root diam: 3.30 cm Ao Asc diam:  2.90 cm MITRAL VALVE               TRICUSPID VALVE MV Area (PHT):  3.60 cm    TR Peak grad:   33.2 mmHg MV Decel Time: 211 msec    TR Vmax:        288.00 cm/s MV E velocity: 81.50 cm/s MV A velocity: 36.40 cm/s  SHUNTS MV E/A ratio:  2.24        Systemic VTI:  0.20 m                            Systemic Diam: 2.30 cm Jenkins Rouge MD Electronically signed by Jenkins Rouge MD Signature Date/Time: 08/29/2021/11:20:18 AM    Final        The results of significant diagnostics from this hospitalization (including imaging, microbiology, ancillary and laboratory) are listed below for reference.     Microbiology: Recent Results (from the past 240 hour(s))  Resp Panel by RT-PCR (Flu A&B, Covid) Nasopharyngeal Swab     Status: None   Collection Time: 08/27/21  9:42 PM   Specimen: Nasopharyngeal Swab; Nasopharyngeal(NP) swabs in vial transport medium  Result Value Ref Range Status   SARS Coronavirus 2 by RT PCR NEGATIVE NEGATIVE Final    Comment: (NOTE) SARS-CoV-2 target nucleic acids are NOT DETECTED.  The SARS-CoV-2 RNA is generally detectable in upper respiratory specimens during the acute phase of infection. The lowest concentration of SARS-CoV-2 viral copies this assay can detect is 138 copies/mL. A negative result does not preclude SARS-Cov-2 infection and should not be used as the sole basis for treatment or other patient management decisions. A negative result may occur with  improper specimen collection/handling, submission of specimen other than nasopharyngeal swab, presence of viral mutation(s) within the areas targeted by this assay, and inadequate number of viral copies(<138 copies/mL). A negative result must be combined with clinical observations, patient history, and epidemiological information. The expected result is Negative.  Fact Sheet for Patients:  EntrepreneurPulse.com.au  Fact Sheet for Healthcare Providers:  IncredibleEmployment.be  This test is no t yet approved or cleared by the [place] FDA  and  has been authorized for detection and/or diagnosis of SARS-CoV-2 by FDA under an Emergency Use Authorization (EUA). This EUA will remain  in effect (meaning this test can be used) for the duration of the COVID-19 declaration under Section 564(b)(1) of the Act, 21 U.S.C.section 360bbb-3(b)(1), unless  the authorization is terminated  or revoked sooner.       Influenza A by PCR NEGATIVE NEGATIVE Final   Influenza B by PCR NEGATIVE NEGATIVE Final    Comment: (NOTE) The Xpert Xpress SARS-CoV-2/FLU/RSV plus assay is intended as an aid in the diagnosis of influenza from Nasopharyngeal swab specimens and should not be used as a sole basis for treatment. Nasal washings and aspirates are unacceptable for Xpert Xpress SARS-CoV-2/FLU/RSV testing.  Fact Sheet for Patients: EntrepreneurPulse.com.au  Fact Sheet for Healthcare Providers: IncredibleEmployment.be  This test is not yet approved or cleared by the [place] FDA and has been authorized for detection and/or diagnosis of SARS-CoV-2 by FDA under an Emergency Use Authorization (EUA). This EUA will remain in effect (meaning this test can be used) for the duration of the COVID-19 declaration under Section 564(b)(1) of the Act, 21 U.S.C. section 360bbb-3(b)(1), unless the authorization is terminated or revoked.  Performed at [facility], [place] [address], [place], [place] [address]      Labs:  CBC: Recent Labs  Lab 08/27/21 2210 08/28/21 0822 08/28/21 1701 08/29/21 0356 08/29/21 0837 08/30/21 1108 08/30/21 1635 08/31/21 0446 08/31/21 1620 09/01/21 0456  WBC 6.3 4.2  --  4.7  --   --   --   --   --   --   NEUTROABS 3.8  --   --   --   --   --   --   --   --   --   HGB 7.5* 8.1*   < > 8.0*   < > 7.6* 8.6* 8.6* 9.2* 8.8*  HCT 26.2* 28.3*   < > 27.1*   < > 25.6* 29.4* 28.3* 31.4* 29.6*  MCV 87.9 88.2  --  86.3  --   --   --   --   --   --   PLT 213 178  --  183  --   --   --    --   --   --    < > = values in this interval not displayed.   BMP &GFR Recent Labs  Lab 08/27/21 2210 08/28/21 0822 08/29/21 0356 08/31/21 0446 09/01/21 0456  NA 135 137 135 138 137  K 4.4 3.8 3.9 4.0 4.6  CL 101 103 103 105 105  CO2 26 28 25 25 25   GLUCOSE 129* 116* 121* 106* 110*  BUN 15 15 17 11 8   CREATININE 1.33* 1.47* 1.51* 1.37* 1.37*  CALCIUM 8.9 8.6* 8.7* 8.4* 8.6*  MG  --   --  2.0 2.3 2.1  PHOS  --   --  3.2 3.6 3.0   Estimated Creatinine Clearance: 59.5 mL/min (A) (by C-G formula based on SCr of 1.37 mg/dL (H)). Liver & Pancreas: Recent Labs  Lab 08/27/21 2210 08/29/21 0356 08/31/21 0446 09/01/21 0456  AST 14*  --   --   --   ALT 10  --   --   --   ALKPHOS 58  --   --   --   BILITOT 0.8  --   --   --   PROT 7.2  --   --   --   ALBUMIN 3.5 3.3* 3.0* 3.1*   No results for input(s): LIPASE, AMYLASE in the last 168 hours. No results for input(s): AMMONIA in the last 168 hours. Diabetic: No results for input(s): HGBA1C in the last 72 hours. No results for input(s): GLUCAP in the last 168 hours. Cardiac  Enzymes: No results for input(s): CKTOTAL, CKMB, CKMBINDEX, TROPONINI in the last 168 hours. No results for input(s): PROBNP in the last 8760 hours. Coagulation Profile: No results for input(s): INR, PROTIME in the last 168 hours. Thyroid Function Tests: No results for input(s): TSH, T4TOTAL, FREET4, T3FREE, THYROIDAB in the last 72 hours. Lipid Profile: No results for input(s): CHOL, HDL, LDLCALC, TRIG, CHOLHDL, LDLDIRECT in the last 72 hours. Anemia Panel: No results for input(s): VITAMINB12, FOLATE, FERRITIN, TIBC, IRON, RETICCTPCT in the last 72 hours. Urine analysis:    Component Value Date/Time   COLORURINE yellow 05/14/2010 0900   APPEARANCEUR Clear 05/14/2010 0900   LABSPEC <1.005 05/14/2010 0900   PHURINE 6.0 05/14/2010 0900   HGBUR large 05/14/2010 0900   BILIRUBINUR negative 02/17/2021 1139   PROTEINUR Negative 02/17/2021 1139    UROBILINOGEN 0.2 02/17/2021 1139   UROBILINOGEN 0.2 05/14/2010 0900   NITRITE positive 02/17/2021 1139   NITRITE positive 05/14/2010 0900   LEUKOCYTESUR Large (3+) (A) 02/17/2021 1139   Sepsis Labs: Invalid input(s): PROCALCITONIN, LACTICIDVEN   Time coordinating discharge: 45 minutes  SIGNED:  Mercy Riding, MD  [facility] 09/01/2021, 5:59 PM

## 2021-09-01 NOTE — Interval H&P Note (Signed)
History and Physical Interval Note:  H/H stable and HD stable without any acute events overnight.   09/01/2021 7:42 AM  Kenn File  has presented today for surgery, with the diagnosis of anemia.  The various methods of treatment have been discussed with the patient and family. After consideration of risks, benefits and other options for treatment, the patient has consented to  Procedure(s): COLONOSCOPY WITH PROPOFOL (N/A) ESOPHAGOGASTRODUODENOSCOPY (EGD) WITH PROPOFOL (N/A) as a surgical intervention.  The patient's history has been reviewed, patient examined, no change in status, stable for surgery.  I have reviewed the patient's chart and labs.  Questions were answered to the patient's satisfaction.     Dominic Pea Brenda Samano

## 2021-09-01 NOTE — Op Note (Signed)
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 Evaluation notable for IDA                            with ferritin 5,iron 23, sat 6%. Responsive to 1U                            pRBCs and IV iron x2. ASA and Plavix held on                            admission, with plan to resume single agent                            anti-platelet therapy after endoscopic evaluation. Providers:                Gerrit Heck, MD, Particia Nearing, RN, Tyna Jaksch Technician Referring MD:              Medicines:                Monitored Anesthesia Care Complications:            No immediate complications. Estimated Blood Loss:     Estimated blood loss was minimal. Procedure:                Pre-Anesthesia Assessment:                           - Prior to the procedure, a History and Physical                            was performed, and patient medications and                            allergies were reviewed. The patient's tolerance of                            previous anesthesia was also reviewed. The risks  and benefits of the procedure and the sedation                            options and risks were discussed with the patient.                            All questions were answered, and informed consent                            was obtained. Prior Anticoagulants: The patient has                            taken Plavix (clopidogrel), last dose was 5 days                            prior to  procedure. ASA Grade Assessment: III - A                            patient with severe systemic disease. After                            reviewing the risks and benefits, the patient was                            deemed in satisfactory condition to undergo the                            procedure.                           After obtaining informed consent, the colonoscope                            was passed under direct vision. Throughout the                            procedure, the patient's blood pressure, pulse, and                            oxygen saturations were monitored continuously. The                            CF-HQ190L ([id number] Olympus coloscope was                            introduced through the anus and advanced to the the                            terminal ileum. The colonoscopy was performed                            without difficulty. The patient tolerated the  procedure well. The quality of the bowel                            preparation was good. The terminal ileum, ileocecal                            valve, appendiceal orifice, and rectum were                            photographed. Scope In: 8:30:11 AM Scope Out: 8:48:13 AM Scope Withdrawal Time: 0 hours 14 minutes 20 seconds  Total Procedure Duration: 0 hours 18 minutes 2 seconds  Findings:      The perianal and digital rectal examinations were normal.      A 15 mm polyp was found in the cecum. The polyp was flat. The polyp was       removed with a saline injection-lift technique using 4 cc of Orise and a       hot snare. Resection and retrieval were complete. To close a defect       after polypectomy and reduce risk of post polypectomy bleed given need       to resume antiplatelet therapy, one hemostatic clip was successfully       placed (MR conditional). There was no bleeding at the end of the       procedure. Estimated blood loss: none.      A 3 mm polyp was found in  the descending colon. The polyp was sessile.       The polyp was removed with a cold snare. Resection and retrieval were       complete. Estimated blood loss was minimal.      Multiple small and large-mouthed diverticula were found in the sigmoid       colon, transverse colon and ascending colon.      Non-bleeding internal hemorrhoids were found during retroflexion. The       hemorrhoids were small.      The mucosa was otherwise normal appearing throughout the colon. No areas       of mucosal erythema, edema, erosions, or ulceration. No active bleeding       or stigmata of recent bleeding noted.      The terminal ileum appeared normal. Impression:               - One 15 mm polyp in the cecum, removed using                            injection-lift and a hot snare. Resected and                            retrieved. Clip (MR conditional) was placed.                           - One 3 mm polyp in the descending colon, removed                            with a cold snare. Resected and retrieved.                           -  Diverticulosis in the sigmoid colon, in the                            transverse colon and in the ascending colon.                           - Non-bleeding internal hemorrhoids.                           - Normal mucosa in the entire examined colon.                           - The examined portion of the ileum was normal. Recommendation:           - Return patient to hospital ward for ongoing care.                           - Advance diet as tolerated.                           - Await pathology results.                           - Ok to resume ASA 81 mg/dily tomorrow.                           - OK to resume other current medications.                           - Repeat CBC in 7-10 days after hospital discharge,                            and repeat CBC with iron panel 2 months after                            discharge.                           - If continued/recurrent  iron deficiency anemia,                            will plan for Video Capsule Endoscopy for further                            small bowel interrogation.                           - Repeat colonoscopy for surveillance based on                            pathology results.                           - GI service will sign off at this time. Please do  not hesitate to contact the inpatient service with                            additional questions or concerns. Procedure Code(s):        --- Professional ---                           [id number], Colonoscopy, flexible; with removal of                            tumor(s), polyp(s), or other lesion(s) by snare                            technique                           45381, Colonoscopy, flexible; with directed                            submucosal injection(s), any substance Diagnosis Code(s):        --- Professional ---                           K63.5, Polyp of colon                           K64.8, Other hemorrhoids                           R19.5, Other fecal abnormalities                           K92.1, Melena (includes Hematochezia)                           D50.9, Iron deficiency anemia, unspecified                           K57.30, Diverticulosis of large intestine without                            perforation or abscess without bleeding CPT copyright 2019 [employer]. All rights reserved. The codes documented in this report are preliminary and upon coder review may  be revised to meet current compliance requirements. Gerrit Heck, MD 09/01/2021 9:14:46 AM Number of Addenda: 0

## 2021-09-01 NOTE — Op Note (Signed)
[facility] Patient Name: Rodney Barnett Procedure Date : 09/01/2021 MRN: [id number] Attending MD: Gerrit Heck , MD Date of Birth: 08-23-1941 CSN: [id number] Age: 80 Admit Type: Inpatient Procedure:                Upper GI endoscopy Indications:              Iron deficiency anemia, Heme positive stool, Melena                           80yo male with hx of CAD/CABG on ASA/Plavix                            admitted with symptomatic anemia and demand                            ischemia 2/2 anemia. Evaluation notable for IDA                            with ferritin 5,iron 23, sat 6%. Responsive to 1U                            pRBCs and IV iron x2. ASA and Plavix held on                            admission, with plan to resume single agent                            anti-platelet therapy after endoscopic evaluation. Providers:                Gerrit Heck, MD, Particia Nearing, RN, Tyna Jaksch Technician Referring MD:              Medicines:                Monitored Anesthesia Care Complications:            No immediate complications. Estimated Blood Loss:     Estimated blood loss was minimal. Procedure:                Pre-Anesthesia Assessment:                           - Prior to the procedure, a History and Physical                            was performed, and patient medications and                            allergies were reviewed. The patient's tolerance of                            previous anesthesia was also reviewed. The risks  and benefits of the procedure and the sedation                            options and risks were discussed with the patient.                            All questions were answered, and informed consent                            was obtained. Prior Anticoagulants: The patient has                            taken Plavix (clopidogrel), last dose was 5 days                             prior to procedure. ASA Grade Assessment: III - A                            patient with severe systemic disease. After                            reviewing the risks and benefits, the patient was                            deemed in satisfactory condition to undergo the                            procedure.                           After obtaining informed consent, the endoscope was                            passed under direct vision. Throughout the                            procedure, the patient's blood pressure, pulse, and                            oxygen saturations were monitored continuously. The                            [id number]-H190 ([id number] Olympus endoscope was introduced                            through the mouth, and advanced to the third part                            of duodenum. The upper GI endoscopy was                            accomplished without difficulty. The patient  tolerated the procedure well. Scope In: Scope Out: Findings:      The examined esophagus was normal.      A single small angioectasia with no bleeding was found in the gastric       body. Coagulation for hemostasis using argon plasma was successful. For       additional hemostasis and prophylaxis against post APC ulcer bleeding,       one hemostatic clip was successfully placed. There was no bleeding at       the end of the procedure.      Normal mucosa was found in the remainder of the stomach.      The examined duodenum was normal. Impression:               - Normal esophagus.                           - A single non-bleeding angioectasia in the                            stomach. Treated with argon plasma coagulation                            (APC). Clip was placed.                           - Normal mucosa was found in the entire stomach.                           - Normal examined duodenum.                           - No specimens collected. Recommendation:            - Use Protonix (pantoprazole) 40 mg PO BID for 6                            weeks to promote mucosal healing after endoscopic                            therapy, then reduce to 40 mg daily and can titrate                            off if no long term need for PPI therapy.                           - Perform a colonoscopy today.                           - Additional recommendations pending colonoscopy                            findings. Procedure Code(s):        --- Professional ---                           [phone], Esophagogastroduodenoscopy, flexible,  transoral; with control of bleeding, any method Diagnosis Code(s):        --- Professional ---                           K31.819, Angiodysplasia of stomach and duodenum                            without bleeding                           D50.9, Iron deficiency anemia, unspecified                           R19.5, Other fecal abnormalities                           K92.1, Melena (includes Hematochezia) CPT copyright 2019 [employer] Association. All rights reserved. The codes documented in this report are preliminary and upon coder review may  be revised to meet current compliance requirements. Gerrit Heck, MD 09/01/2021 9:05:00 AM Number of Addenda: 0

## 2021-09-01 NOTE — Telephone Encounter (Signed)
-----   Message from [place], DO sent at 09/01/2021 10:33 AM EST ----- Anticipate patient being discharged in the next day or so.  Please set up the following:  - Follow appointment with Dr. Tarri Glenn (was assigned to her during this hospital admission) - Repeat CBC in 7-10 days - Repeat CBC and iron panel in 2 months  Thank you

## 2021-09-01 NOTE — Transfer of Care (Signed)
Immediate Anesthesia Transfer of Care Note  Patient: Rodney Barnett [facility]  Procedure(s) Performed: COLONOSCOPY WITH PROPOFOL ESOPHAGOGASTRODUODENOSCOPY (EGD) WITH PROPOFOL ENDOSCOPIC MUCOSAL RESECTION POLYPECTOMY  Patient Location: PACU  Anesthesia Type:MAC  Level of Consciousness: drowsy and patient cooperative  Airway & Oxygen Therapy: Patient Spontanous Breathing and Patient connected to nasal cannula oxygen  Post-op Assessment: Report given to RN, Post -op Vital signs reviewed and stable and Patient moving all extremities X 4  Post vital signs: Reviewed and stable  Last Vitals:  Vitals Value Taken Time  BP 94/37 09/01/21 0858  Temp    Pulse 67 09/01/21 0859  Resp 34 09/01/21 0859  SpO2 96 % 09/01/21 0859  Vitals shown include unvalidated device data.  Last Pain:  Vitals:   09/01/21 0732  TempSrc:   PainSc: 0-No pain      Patients Stated Pain Goal: 2 (22/[birth year] 8921)  Complications: No notable events documented.

## 2021-09-01 NOTE — Telephone Encounter (Signed)
Called and spoke to patient's sister about recommendations. Pt sister will call us back to schedule follow up appt. She is aware that pt will need repeat blood work in about a week. She verbalized understanding.   Lab order and reminder in epic for CBC in 7-10 days.  Lab reminder in epic for cbc and iron panel in 2 months.

## 2021-09-01 NOTE — Plan of Care (Signed)
  Problem: Education: Goal: Knowledge of General Education information will improve Description: Including pain rating scale, medication(s)/side effects and non-pharmacologic comfort measures Outcome: Adequate for Discharge   Problem: Health Behavior/Discharge Planning: Goal: Ability to manage health-related needs will improve Outcome: Adequate for Discharge   Problem: Clinical Measurements: Goal: Ability to maintain clinical measurements within normal limits will improve Outcome: Adequate for Discharge Goal: Diagnostic test results will improve Outcome: Adequate for Discharge Goal: Respiratory complications will improve Outcome: Adequate for Discharge Goal: Cardiovascular complication will be avoided Outcome: Adequate for Discharge   Problem: Activity: Goal: Risk for activity intolerance will decrease Outcome: Adequate for Discharge   Problem: Nutrition: Goal: Adequate nutrition will be maintained Outcome: Adequate for Discharge   Problem: Coping: Goal: Level of anxiety will decrease Outcome: Adequate for Discharge   Problem: Elimination: Goal: Will not experience complications related to bowel motility Outcome: Adequate for Discharge Goal: Will not experience complications related to urinary retention Outcome: Adequate for Discharge   Problem: Pain Managment: Goal: General experience of comfort will improve Outcome: Adequate for Discharge   Problem: Safety: Goal: Ability to remain free from injury will improve Outcome: Adequate for Discharge   Problem: Skin Integrity: Goal: Risk for impaired skin integrity will decrease Outcome: Adequate for Discharge

## 2021-09-02 ENCOUNTER — Ambulatory Visit (INDEPENDENT_AMBULATORY_CARE_PROVIDER_SITE_OTHER): Payer: Medicare Other | Admitting: Family Medicine

## 2021-09-02 VITALS — BP 118/62 | HR 73 | Temp 98.0°F | Wt 247.7 lb

## 2021-09-02 DIAGNOSIS — K922 Gastrointestinal hemorrhage, unspecified: Secondary | ICD-10-CM

## 2021-09-02 DIAGNOSIS — D509 Iron deficiency anemia, unspecified: Secondary | ICD-10-CM | POA: Diagnosis not present

## 2021-09-02 DIAGNOSIS — I2510 Atherosclerotic heart disease of native coronary artery without angina pectoris: Secondary | ICD-10-CM | POA: Diagnosis not present

## 2021-09-02 LAB — SURGICAL PATHOLOGY

## 2021-09-02 NOTE — Progress Notes (Signed)
Established Patient Office Visit  Subjective:  Patient ID: Rodney Barnett, male    DOB: 21-Aug-1941  Age: 80 y.o. MRN: [id number]  CC:  Chief Complaint  Patient presents with   [facility] admission    HPI Rodney Barnett presents for hospital follow-up.  He was admitted on the 23rd of this month and discharged just yesterday.  He had noted about a week prior to admission some increased shortness of breath with things like walking to the mailbox.  He was actually in process of being scheduled for upcoming heart catheterization and wait for some lab work.  His hemoglobin came back 7.6 compared with 14.3 back in 11/21.  Patient related to some generalized weakness and fatigue.  Have been taking Plavix and aspirin as well as some Goody powders occasionally.  Hemoglobin at arrival to the ED was 7.5 with Hemoccult positive.  Further studies revealed iron deficiency.  B12 was low normal at 246.  He received 1 unit of packed red blood cells without much improvement.  He then received another unit as well as IV ferric gluconate 250 mg twice.  EGD showed single nonbleeding AVM that was treated.  Colonoscopy revealed cecal polyp and 3 mm descending polyp.  Mention of a sending colon diverticulosis without bleed.  Nonbleeding internal hemorrhoids.  Recommendation was to hold Plavix indefinitely.  Patient continues to take aspirin.  He was also started on Protonix 40 mg daily for 6 weeks.  His AVM was treated with cautery and a clip.  Feels improved overall at this time.  He had discharge hemoglobin 8.8.  He states he is already walking to his mailbox without much difficulty.  Appetite fair.  Past Medical History:  Diagnosis Date   Chronic back pain    Chronic rhinitis    Coronary atherosclerosis of native coronary artery    Multivessel, LVEF 55-60%, basal inferior hypokinesis, known graft disease   Erectile dysfunction    Essential hypertension    Insomnia    Lipoma    Mixed hyperlipidemia     Peripheral neuropathy     Past Surgical History:  Procedure Laterality Date   CORONARY ARTERY BYPASS GRAFT  1994   LIMA to LAD, SVG to RCA, SVG to diagonal   TONSILLECTOMY     TRANSURETHRAL RESECTION OF BLADDER TUMOR N/A 09/09/2018   Procedure: TRANSURETHRAL RESECTION OF BLADDER TUMOR (TURBT) WITH INSTILLATION OF POST OPERATIVE CHEMOTHERAPY;  Surgeon: Kathie Rhodes, MD;  Location: [facility];  Service: Urology;  Laterality: N/A;    Family History  Problem Relation Age of Onset   Hypertension Other    Cancer Brother        renal cancer    Social History   Socioeconomic History   Marital status: Widowed    Spouse name: Not on file   Number of children: Not on file   Years of education: Not on file   Highest education level: Not on file  Occupational History   Not on file  Tobacco Use   Smoking status: Former    Packs/day: 3.00    Years: 40.00    Pack years: 120.00    Types: Cigarettes    Start date: 10/05/1954    Quit date: 01/06/1993    Years since quitting: 28.6   Smokeless tobacco: Never  Vaping Use   Vaping Use: Never used  Substance and Sexual Activity   Alcohol use: No    Alcohol/week: 0.0 standard drinks   Drug use: No  Sexual activity: Not on file  Other Topics Concern   Not on file  Social History Narrative   Not on file   Social Determinants of Health   Financial Resource Strain: Low Risk    Difficulty of Paying Living Expenses: Not hard at all  Food Insecurity: No Food Insecurity   Worried About Running Out of Food in the Last Year: Never true   [place] in the Last Year: Never true  Transportation Needs: No Transportation Needs   Lack of Transportation (Medical): No   Lack of Transportation (Non-Medical): No  Physical Activity: Insufficiently Active   Days of Exercise per Week: 3 days   Minutes of Exercise per Session: 30 min  Stress: No Stress Concern Present   Feeling of Stress : Not at all  Social Connections: Socially Isolated    Frequency of Communication with Friends and Family: Three times a week   Frequency of Social Gatherings with Friends and Family: Three times a week   Attends Religious Services: Never   Active Member of Clubs or Organizations: No   Attends Archivist Meetings: Never   Marital Status: Widowed  Human resources officer Violence: Not At Risk   Fear of Current or Ex-Partner: No   Emotionally Abused: No   Physically Abused: No   Sexually Abused: No    Outpatient Medications Prior to Visit  Medication Sig Dispense Refill   aspirin 81 MG tablet Take 81 mg by mouth daily.     gabapentin (NEURONTIN) 300 MG capsule TAKE (2) CAPSULES BY MOUTH THREE TIMES DAILY. (Patient taking differently: Take 600 mg by mouth 3 (three) times daily.) 180 capsule 0   HYDROcodone-acetaminophen (NORCO) 10-325 MG tablet TAKE (1) TABLET BY MOUTH EVERY SIX HOURS AS NEEDED (Patient taking differently: Take 0.5 tablets by mouth every 6 (six) hours as needed for moderate pain.) 90 tablet 0   isosorbide mononitrate (IMDUR) 30 MG 24 hr tablet TAKE 1 TABLET ONCE DAILY. (Patient taking differently: 30 mg daily.) 30 tablet 0   [START ON 09/08/2021] lisinopril (ZESTRIL) 10 MG tablet Take 1 tablet (10 mg total) by mouth daily. 30 tablet 0   metoprolol tartrate (LOPRESSOR) 25 MG tablet TAKE (1) TABLET TWICE DAILY. (Patient taking differently: Take 25 mg by mouth 2 (two) times daily.) 60 tablet 0   Omega-3 Fatty Acids (FISH OIL) 1000 MG CPDR Take 1,000 mg by mouth daily.     pantoprazole (PROTONIX) 40 MG tablet Take 1 tablet (40 mg total) by mouth daily. 45 tablet 0   rosuvastatin (CRESTOR) 20 MG tablet TAKE 1 TABLET ONCE DAILY. (Patient taking differently: Take 20 mg by mouth at bedtime.) 30 tablet 0   nitroGLYCERIN (NITROSTAT) 0.4 MG SL tablet Place 1 tablet (0.4 mg total) under the tongue every 5 (five) minutes x 3 doses as needed for chest pain (If no relief after 3rd dose, proceed to the ED for an evaluation or call 911). 25 tablet  0   No facility-administered medications prior to visit.    Allergies  Allergen Reactions   Lipitor [Atorvastatin] Other (See Comments)    myalgia    ROS Review of Systems  Constitutional:  Negative for appetite change, chills and fever.  Respiratory:  Positive for shortness of breath.   Cardiovascular:  Negative for chest pain.  Gastrointestinal:  Negative for abdominal pain, nausea and vomiting.  Genitourinary:  Negative for enuresis.  Neurological:  Negative for dizziness.     Objective:    Physical Exam Vitals  reviewed.  Cardiovascular:     Rate and Rhythm: Normal rate and regular rhythm.  Pulmonary:     Effort: Pulmonary effort is normal.     Breath sounds: Normal breath sounds.  Musculoskeletal:     Right lower leg: No edema.     Left lower leg: No edema.  Neurological:     Mental Status: He is alert.    BP 118/62 (BP Location: Left Arm, Patient Position: Sitting, Cuff Size: Large)   Pulse 73   Temp 98 F (36.7 C) (Oral)   Wt 247 lb 11.2 oz (112.4 kg)   SpO2 93%   BMI 30.15 kg/m  Wt Readings from Last 3 Encounters:  09/02/21 247 lb 11.2 oz (112.4 kg)  09/01/21 243 lb (110.2 kg)  08/27/21 252 lb 12.8 oz (114.7 kg)     Health Maintenance Due  Topic Date Due   COVID-19 Vaccine (1) Never done   Hepatitis C Screening  Never done   TETANUS/TDAP  Never done   Zoster Vaccines- Shingrix (1 of 2) Never done   Pneumonia Vaccine 45+ Years old (2 - PPSV23 if available, else PCV20) 05/19/2019    There are no preventive care reminders to display for this patient.  No results found for: TSH Lab Results  Component Value Date   WBC 4.7 08/29/2021   HGB 8.8 (L) 09/01/2021   HCT 29.6 (L) 09/01/2021   MCV 86.3 08/29/2021   PLT 183 08/29/2021   Lab Results  Component Value Date   NA 137 09/01/2021   K 4.6 09/01/2021   CO2 25 09/01/2021   GLUCOSE 110 (H) 09/01/2021   BUN 8 09/01/2021   CREATININE 1.37 (H) 09/01/2021   BILITOT 0.8 08/27/2021   ALKPHOS 58  08/27/2021   AST 14 (L) 08/27/2021   ALT 10 08/27/2021   PROT 7.2 08/27/2021   ALBUMIN 3.1 (L) 09/01/2021   CALCIUM 8.6 (L) 09/01/2021   ANIONGAP 7 09/01/2021   GFR 47.48 (L) 08/20/2021   Lab Results  Component Value Date   CHOL 102 08/20/2021   Lab Results  Component Value Date   HDL 35.90 (L) 08/20/2021   Lab Results  Component Value Date   LDLCALC 39 08/20/2021   Lab Results  Component Value Date   TRIG 132.0 08/20/2021   Lab Results  Component Value Date   CHOLHDL 3 08/20/2021   No results found for: HGBA1C    Assessment & Plan:   #1 iron deficiency anemia.  Patient presented with recent dyspnea and dizziness and fatigue.  Hemoglobin 7.6.  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  He had a couple of benign colon polyps.  No active source of bleeding.  -Handout given on iron rich diet -Recheck CBC in 1 week -Continue to hold Plavix -Continue Protonix until completed for 6 weeks -Follow-up immediately for any increased shortness of breath, dizziness, bloody stools, etc. -With consider repeat ferritin and CBC in a couple months  #2 history of CAD.  No recent chest pains.  Suspect dyspnea probably related to his iron deficiency anemia.   No orders of the defined types were placed in this encounter.   Follow-up: No follow-ups on file.    Carolann Littler, MD

## 2021-09-02 NOTE — Patient Instructions (Signed)
Continue to hold the Plavix  Continue the Protonix for 6 weeks and then stop  Return in one week for repeat CBC

## 2021-09-03 ENCOUNTER — Encounter (HOSPITAL_COMMUNITY): Payer: Self-pay | Admitting: Gastroenterology

## 2021-09-05 ENCOUNTER — Other Ambulatory Visit: Payer: Self-pay

## 2021-09-05 ENCOUNTER — Encounter: Payer: Self-pay | Admitting: Gastroenterology

## 2021-09-05 DIAGNOSIS — D500 Iron deficiency anemia secondary to blood loss (chronic): Secondary | ICD-10-CM

## 2021-09-08 ENCOUNTER — Telehealth: Payer: Self-pay

## 2021-09-08 NOTE — Telephone Encounter (Signed)
Patient called asking if he needs to be seen tomorrow 12/6 by Dr. Elease Hashimoto or Lab only appt? Patient would like a call back

## 2021-09-08 NOTE — Telephone Encounter (Signed)
Spoke with patient, per Dr. Elease Hashimoto last note, patient is to return one week form 09/02/21 for lab appt

## 2021-09-09 ENCOUNTER — Ambulatory Visit: Payer: Medicare Other | Admitting: Family Medicine

## 2021-09-09 ENCOUNTER — Other Ambulatory Visit (INDEPENDENT_AMBULATORY_CARE_PROVIDER_SITE_OTHER): Payer: Medicare Other

## 2021-09-09 DIAGNOSIS — D509 Iron deficiency anemia, unspecified: Secondary | ICD-10-CM

## 2021-09-09 LAB — CBC WITH DIFFERENTIAL/PLATELET
Basophils Absolute: 0.1 10*3/uL (ref 0.0–0.1)
Basophils Relative: 0.9 % (ref 0.0–3.0)
Eosinophils Absolute: 0.3 10*3/uL (ref 0.0–0.7)
Eosinophils Relative: 4.5 % (ref 0.0–5.0)
HCT: 33.9 % — ABNORMAL LOW (ref 39.0–52.0)
Hemoglobin: 10.4 g/dL — ABNORMAL LOW (ref 13.0–17.0)
Lymphocytes Relative: 24.1 % (ref 12.0–46.0)
Lymphs Abs: 1.5 10*3/uL (ref 0.7–4.0)
MCHC: 30.6 g/dL (ref 30.0–36.0)
MCV: 85.8 fl (ref 78.0–100.0)
Monocytes Absolute: 0.8 10*3/uL (ref 0.1–1.0)
Monocytes Relative: 12.4 % — ABNORMAL HIGH (ref 3.0–12.0)
Neutro Abs: 3.6 10*3/uL (ref 1.4–7.7)
Neutrophils Relative %: 58.1 % (ref 43.0–77.0)
Platelets: 242 10*3/uL (ref 150.0–400.0)
RBC: 3.95 Mil/uL — ABNORMAL LOW (ref 4.22–5.81)
RDW: 21.4 % — ABNORMAL HIGH (ref 11.5–15.5)
WBC: 6.1 10*3/uL (ref 4.0–10.5)

## 2021-09-19 ENCOUNTER — Other Ambulatory Visit: Payer: Self-pay | Admitting: Family Medicine

## 2021-09-23 ENCOUNTER — Ambulatory Visit: Payer: Medicare Other | Admitting: Cardiology

## 2021-09-26 ENCOUNTER — Ambulatory Visit: Payer: Medicare Other | Admitting: Cardiology

## 2021-10-14 ENCOUNTER — Encounter: Payer: Self-pay | Admitting: *Deleted

## 2021-10-17 ENCOUNTER — Other Ambulatory Visit (INDEPENDENT_AMBULATORY_CARE_PROVIDER_SITE_OTHER): Payer: Medicare Other

## 2021-10-17 ENCOUNTER — Ambulatory Visit: Payer: Medicare Other | Admitting: Gastroenterology

## 2021-10-17 ENCOUNTER — Encounter: Payer: Self-pay | Admitting: Gastroenterology

## 2021-10-17 ENCOUNTER — Other Ambulatory Visit: Payer: Self-pay

## 2021-10-17 ENCOUNTER — Telehealth: Payer: Self-pay | Admitting: Gastroenterology

## 2021-10-17 ENCOUNTER — Other Ambulatory Visit: Payer: Self-pay | Admitting: Family Medicine

## 2021-10-17 VITALS — BP 106/60 | HR 72 | Ht 73.0 in | Wt 245.0 lb

## 2021-10-17 DIAGNOSIS — D500 Iron deficiency anemia secondary to blood loss (chronic): Secondary | ICD-10-CM

## 2021-10-17 LAB — IBC + FERRITIN
Ferritin: 8.5 ng/mL — ABNORMAL LOW (ref 22.0–322.0)
Iron: 49 ug/dL (ref 42–165)
Saturation Ratios: 12.9 % — ABNORMAL LOW (ref 20.0–50.0)
TIBC: 379.4 ug/dL (ref 250.0–450.0)
Transferrin: 271 mg/dL (ref 212.0–360.0)

## 2021-10-17 LAB — CBC
HCT: 37.4 % — ABNORMAL LOW (ref 39.0–52.0)
Hemoglobin: 11.9 g/dL — ABNORMAL LOW (ref 13.0–17.0)
MCHC: 31.8 g/dL (ref 30.0–36.0)
MCV: 85.5 fl (ref 78.0–100.0)
Platelets: 199 10*3/uL (ref 150.0–400.0)
RBC: 4.38 Mil/uL (ref 4.22–5.81)
RDW: 18.9 % — ABNORMAL HIGH (ref 11.5–15.5)
WBC: 6.1 10*3/uL (ref 4.0–10.5)

## 2021-10-17 MED ORDER — IRON (FERROUS SULFATE) 325 (65 FE) MG PO TABS: 1.0000 | ORAL_TABLET | Freq: Every day | ORAL | Status: DC

## 2021-10-17 MED ORDER — PANTOPRAZOLE SODIUM 40 MG PO TBEC: 40.0000 mg | DELAYED_RELEASE_TABLET | Freq: Every day | ORAL | 1 refills | Status: DC

## 2021-10-17 NOTE — Patient Instructions (Addendum)
Your provider has requested that you go to the basement level for lab work before leaving today. Press "B" on the elevator. The lab is located at the first door on the left as you exit the elevator.  You will be due for a recall colonoscopy in 2025. We will send you a reminder in the mail when it gets closer to that time.  If you are age 80 or older, your body mass index should be between 23-30. Your Body mass index is 32.32 kg/m. If this is out of the aforementioned range listed, please consider follow up with your Primary Care Provider.  If you are age 26 or younger, your body mass index should be between 19-25. Your Body mass index is 32.32 kg/m. If this is out of the aformentioned range listed, please consider follow up with your Primary Care Provider.   ________________________________________________________  The [facility] providers would like to encourage you to use [facility] to communicate with providers for non-urgent requests or questions.  Due to long hold times on the telephone, sending your provider a message by [facility] may be a faster and more efficient way to get a response.  Please allow 48 business hours for a response.  Please remember that this is for non-urgent requests.  _______________________________________________________  Due to recent changes in healthcare laws, you may see the results of your imaging and laboratory studies on MyChart before your provider has had a chance to review them.  We understand that in some cases there may be results that are confusing or concerning to you. Not all laboratory results come back in the same time frame and the provider may be waiting for multiple results in order to interpret others.  Please give [place] 48 hours in order for your provider to thoroughly review all the results before contacting the office for clarification of your results.

## 2021-10-17 NOTE — Progress Notes (Addendum)
Referring Provider: [name], MD Primary Care Physician:  [name], MD  Chief complaint:  Hospital follow-up   IMPRESSION:  Symptomatic anemia with recent melena and hemoccult + stools likely due to gastric AVM and colon polyps.      - Symptoms of anemia and GI bleeding have resolved.      - Labs today to monitor his anemia.      - He may need to resume oral iron.  Chronic reflux with ongoing symptoms.   - No esophagitis on recent EGD.   - Continue pantoprazole.  History of colon polyps. - tubular adenoma on colonoscopy 2003 - 15 mm tubular adenoma in the cecum and a small descending tubular adenoma removed 09/02/2021 - Recommend surveillance colonoscopy in 3 years if clinically appropriate at that time  PLAN: - CBC, iron, ferritin - Continue pantoprazole given ongoing heartburn  - Surveillance Colonoscopy 2025 if clinically appropriate at that time    HPI: Rodney Barnett is a 81 y.o. male returns in follow-up after his recent hospitalization in November.  The interval history is obtained through the patient and review of his electronic health record. He is unaccompanied today.  He was seen by GI for symptomatic anemia with recent melena and Hemoccult positive stools occurring in the setting of Plavix and aspirin.  On hospitalization his hemoglobin was 7.6 down from a baseline hemoglobin of 14.3 a year earlier.  EGD 09/01/2021: Normal esophagus, small gastric body AVM treated with APC and Hemoclip Colonoscopy 09/01/2021 showed a 15 mm flat tubular adenoma in the cecum, a 3 mm descending colon tubular adenoma, multiple pancolonic diverticulosis, and internal hemorrhoids.    He returns today in follow-up. Asymptomatic from the standpoint of his anemia. He used iron OTC temporarily but stopped taking it when his stools turned green.  There has been no melena, hematochezia, or bright red blood per rectum.  No altered bowel habits.  Rare hearburn. He has liked  using the pantoprazole.  His last dose is today.  He has not been needing Tums as needed.  GI review of systems is negative.  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   Chronic back pain    Chronic rhinitis    Coronary atherosclerosis of native coronary artery    Multivessel, LVEF 55-60%, basal inferior hypokinesis, known graft disease   Diverticulosis    Erectile dysfunction    Essential hypertension    History of bladder cancer    Insomnia    Internal hemorrhoids    Lipoma    Mixed hyperlipidemia    Peripheral neuropathy    Tubular adenoma of colon     Past Surgical History:  Procedure Laterality Date   COLONOSCOPY WITH PROPOFOL N/A 09/01/2021   Procedure: COLONOSCOPY WITH PROPOFOL;  Surgeon: Lavena Bullion, DO;  Location: [facility];  Service: Gastroenterology;  Laterality: N/A;   CORONARY ARTERY BYPASS GRAFT  1994   LIMA to LAD, SVG to RCA, SVG to diagonal   ENDOSCOPIC MUCOSAL RESECTION  09/01/2021   Procedure: ENDOSCOPIC MUCOSAL RESECTION;  Surgeon: Lavena Bullion, DO;  Location: [facility];  Service: Gastroenterology;;   ESOPHAGOGASTRODUODENOSCOPY (EGD) WITH PROPOFOL N/A 09/01/2021   Procedure: ESOPHAGOGASTRODUODENOSCOPY (EGD) WITH PROPOFOL;  Surgeon: Lavena Bullion, DO;  Location: [facility];  Service: Gastroenterology;  Laterality: N/A;   HEMOSTASIS CLIP  PLACEMENT  09/01/2021   Procedure: HEMOSTASIS CLIP PLACEMENT;  Surgeon: Lavena Bullion, DO;  Location: [facility];  Service: Gastroenterology;;   HOT HEMOSTASIS N/A 09/01/2021   Procedure: HOT HEMOSTASIS (ARGON PLASMA COAGULATION/BICAP);  Surgeon: Lavena Bullion, DO;  Location[facility];  Service: Gastroenterology;  Laterality: N/A;   POLYPECTOMY  09/01/2021   Procedure: POLYPECTOMY;  Surgeon: Lavena Bullion, DO;  Location: [facility];   Service: Gastroenterology;;   SUBMUCOSAL LIFTING INJECTION  09/01/2021   Procedure: SUBMUCOSAL LIFTING INJECTION;  Surgeon: Lavena Bullion, DO;  Location: [facility];  Service: Gastroenterology;;   TONSILLECTOMY     TRANSURETHRAL RESECTION OF BLADDER TUMOR N/A 09/09/2018   Procedure: TRANSURETHRAL RESECTION OF BLADDER TUMOR (TURBT) WITH INSTILLATION OF POST OPERATIVE CHEMOTHERAPY;  Surgeon: Kathie Rhodes, MD;  Location: [facility];  Service: Urology;  Laterality: N/A;     Current Outpatient Medications  Medication Sig Dispense Refill   aspirin 81 MG tablet Take 81 mg by mouth daily.     HYDROcodone-acetaminophen (NORCO) 10-325 MG tablet TAKE (1) TABLET BY MOUTH EVERY SIX HOURS AS NEEDED (Patient taking differently: Take 0.5 tablets by mouth every 6 (six) hours as needed for moderate pain.) 90 tablet 0   lisinopril (ZESTRIL) 10 MG tablet Take 1 tablet (10 mg total) by mouth daily. 30 tablet 0   Omega-3 Fatty Acids (FISH OIL) 1000 MG CPDR Take 1,000 mg by mouth daily.     gabapentin (NEURONTIN) 300 MG capsule TAKE (2) CAPSULES BY MOUTH THREE TIMES DAILY. 180 capsule 0   isosorbide mononitrate (IMDUR) 30 MG 24 hr tablet TAKE 1 TABLET ONCE DAILY. 30 tablet 0   metoprolol tartrate (LOPRESSOR) 25 MG tablet TAKE (1) TABLET TWICE DAILY. 60 tablet 0   nitroGLYCERIN (NITROSTAT) 0.4 MG SL tablet Place 1 tablet (0.4 mg total) under the tongue every 5 (five) minutes x 3 doses as needed for chest pain (If no relief after 3rd dose, proceed to the ED for an evaluation or call 911). (Patient not taking: Reported on 10/17/2021) 25 tablet 0   pantoprazole (PROTONIX) 40 MG tablet Take 1 tablet (40 mg total) by mouth daily. 90 tablet 1   rosuvastatin (CRESTOR) 20 MG tablet TAKE 1 TABLET ONCE DAILY. 30 tablet 0   No current facility-administered medications for this visit.    Allergies as of 10/17/2021 - Review Complete 10/17/2021  Allergen Reaction Noted   Lipitor [atorvastatin] Other (See Comments) 06/07/2013     Family History  Problem Relation Age of Onset   Hypertension Other    Cancer Brother        renal cancer      Physical Exam: General:   Alert,  well-nourished, pleasant and cooperative in NAD Head:  Normocephalic and atraumatic. Eyes:  Sclera clear, no icterus.   Conjunctiva pink. Abdomen:  Soft, nontender, nondistended, normal bowel sounds, no rebound or guarding. No hepatosplenomegaly.   Neurologic:  Alert and  oriented x4;  grossly nonfocal Skin:  Intact without significant lesions or rashes. Psych:  Alert and cooperative. Normal mood and affect.  I spent 32 minutes, including in depth chart review, independent review of results, communicating results with the patient directly, face-to-face time with the patient, coordinating care, and ordering studies and medications as appropriate, and documentation.   Jaydian Santana L. Tarri Glenn, MD, MPH 10/17/2021, 3:37 PM

## 2021-10-17 NOTE — Telephone Encounter (Signed)
Patient called and stated that he went to the pharmacy to pick up medication that he was proscribed today at his OV. Patient stated that pharmacy did not have anything for him. Seeking advice, please advise.

## 2021-10-17 NOTE — Telephone Encounter (Signed)
I have spoken to patient's pharmacy who indicates that they do have prescription and it has gone through without issue. I have also told patient.

## 2021-10-28 ENCOUNTER — Other Ambulatory Visit: Payer: Self-pay | Admitting: Family Medicine

## 2021-11-18 ENCOUNTER — Telehealth: Payer: Self-pay | Admitting: Family Medicine

## 2021-11-18 ENCOUNTER — Other Ambulatory Visit: Payer: Self-pay | Admitting: Family Medicine

## 2021-11-18 NOTE — Telephone Encounter (Signed)
Last filled 08/20/2021 Last OV 09/02/2021  Ok to fill?

## 2021-11-18 NOTE — Telephone Encounter (Signed)
Patient called in to request a refill for HYDROcodone-acetaminophen (NORCO) 10-325 MG tablet [[id number]]  to be sent to his pharmacy.  Patient wanted Dr.Burchette to know that he will be seeing him in 90 days.  Patient could be contacted at [phone].  Please advise.

## 2021-11-19 NOTE — Telephone Encounter (Signed)
Patient called in to check on request.

## 2021-11-20 MED ORDER — HYDROCODONE-ACETAMINOPHEN 10-325 MG PO TABS: ORAL_TABLET | ORAL | 0 refills | Status: DC

## 2021-11-20 NOTE — Telephone Encounter (Signed)
Pt is calling rx has not been sent to pharm . I do not see confirmation from pharm

## 2021-11-20 NOTE — Telephone Encounter (Signed)
Spoke with the patient. He is aware his Rx has been sent in.

## 2021-11-20 NOTE — Telephone Encounter (Signed)
Refilled.    Please apologize for me being late and let him know I was out sick past 2 days.

## 2021-11-20 NOTE — Telephone Encounter (Signed)
Confirmed that the pharmacy did not receive this. Please re-send.

## 2021-11-21 MED ORDER — HYDROCODONE-ACETAMINOPHEN 10-325 MG PO TABS: ORAL_TABLET | ORAL | 0 refills | Status: DC

## 2021-11-21 NOTE — Telephone Encounter (Signed)
Spoke with the patient. He is aware his prescription has been re-sent.

## 2021-11-21 NOTE — Addendum Note (Signed)
Addended by: [name] on: 11/21/2021 07:38 AM   Modules accepted: Orders

## 2021-11-21 NOTE — Telephone Encounter (Signed)
Prescription resent

## 2021-12-12 ENCOUNTER — Other Ambulatory Visit: Payer: Self-pay

## 2021-12-12 MED ORDER — ROSUVASTATIN CALCIUM 20 MG PO TABS: 20.0000 mg | ORAL_TABLET | Freq: Every day | ORAL | 0 refills | Status: DC

## 2021-12-19 ENCOUNTER — Other Ambulatory Visit: Payer: Self-pay | Admitting: Family Medicine

## 2022-02-16 ENCOUNTER — Other Ambulatory Visit: Payer: Self-pay | Admitting: Family Medicine

## 2022-02-18 ENCOUNTER — Encounter: Payer: Self-pay | Admitting: Family Medicine

## 2022-02-18 ENCOUNTER — Ambulatory Visit (INDEPENDENT_AMBULATORY_CARE_PROVIDER_SITE_OTHER): Payer: Medicare Other | Admitting: Family Medicine

## 2022-02-18 VITALS — BP 102/60 | HR 72 | Temp 97.9°F | Ht 73.0 in | Wt 231.9 lb

## 2022-02-18 DIAGNOSIS — M5442 Lumbago with sciatica, left side: Secondary | ICD-10-CM

## 2022-02-18 DIAGNOSIS — D509 Iron deficiency anemia, unspecified: Secondary | ICD-10-CM | POA: Diagnosis not present

## 2022-02-18 DIAGNOSIS — G8929 Other chronic pain: Secondary | ICD-10-CM | POA: Diagnosis not present

## 2022-02-18 LAB — CBC WITH DIFFERENTIAL/PLATELET
Basophils Absolute: 0 10*3/uL (ref 0.0–0.1)
Basophils Relative: 0.6 % (ref 0.0–3.0)
Eosinophils Absolute: 0.2 10*3/uL (ref 0.0–0.7)
Eosinophils Relative: 3.7 % (ref 0.0–5.0)
HCT: 38 % — ABNORMAL LOW (ref 39.0–52.0)
Hemoglobin: 12.6 g/dL — ABNORMAL LOW (ref 13.0–17.0)
Lymphocytes Relative: 25 % (ref 12.0–46.0)
Lymphs Abs: 1.4 10*3/uL (ref 0.7–4.0)
MCHC: 33.2 g/dL (ref 30.0–36.0)
MCV: 94.7 fl (ref 78.0–100.0)
Monocytes Absolute: 0.6 10*3/uL (ref 0.1–1.0)
Monocytes Relative: 11.1 % (ref 3.0–12.0)
Neutro Abs: 3.4 10*3/uL (ref 1.4–7.7)
Neutrophils Relative %: 59.6 % (ref 43.0–77.0)
Platelets: 203 10*3/uL (ref 150.0–400.0)
RBC: 4.02 Mil/uL — ABNORMAL LOW (ref 4.22–5.81)
RDW: 15.4 % (ref 11.5–15.5)
WBC: 5.6 10*3/uL (ref 4.0–10.5)

## 2022-02-18 LAB — FERRITIN: Ferritin: 88.4 ng/mL (ref 22.0–322.0)

## 2022-02-18 MED ORDER — HYDROCODONE-ACETAMINOPHEN 10-325 MG PO TABS: ORAL_TABLET | ORAL | 0 refills | Status: DC

## 2022-02-18 NOTE — Progress Notes (Signed)
? ?Established Patient Office Visit ? ?Subjective   ?Patient ID: Rodney Barnett, male    DOB: [birth year]  Age: 81 y.o. MRN: [id number] ? ?Chief Complaint  ?Patient presents with  ? Follow-up  ? ? ?HPI ? ? ?Seen for medical follow-up.  His chronic back pain with lumbar stenosis.  He was lifting 5-monthold grandson last week when he felt a "pop "low back region.  Has had some increased pain since then.  No radiculitis symptoms.  He takes one 10 mg hydrocodone at baseline and has been on this regimen for years which allows him to be more functional during the day.  Avoids nonsteroidals. ? ?He has history of CAD.  Remains on multiple medications including lisinopril, metoprolol, rosuvastatin, isosorbide.  No recent chest pains.  Followed by cardiology.  Is on statin with rosuvastatin.  Last lipids were last November. ? ?He does have history of iron deficiency anemia.  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  He had EGD and colonoscopy at that time.  There have been discussion of referral to hematologist for possible iron infusion given his persistent low ferritin.  This was months ago.  He has been taking some oral iron.  Denies any dizziness.  No abdominal pain.  No melena. ? ?He has a 113-monthld grandson that lives next door and has had a good time getting to know him and spends much time with him each day. ? ?Past Medical History:  ?Diagnosis Date  ? Anemia   ? 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 Chronic back pain   ? Chronic rhinitis   ? Coronary atherosclerosis of native coronary artery   ? Multivessel, LVEF 55-60%, basal inferior hypokinesis, known graft disease  ? Diverticulosis   ? Erectile dysfunction   ? Essential hypertension   ? History of bladder cancer   ? Insomnia   ? Internal hemorrhoids   ? Lipoma   ? Mixed hyperlipidemia   ? Peripheral neuropathy   ? Tubular adenoma of colon   ? ?Past Surgical History:  ?Procedure Laterality Date  ? COLONOSCOPY WITH PROPOFOL N/A  09/01/2021  ? Procedure: COLONOSCOPY WITH PROPOFOL;  Surgeon: CiLavena BullionDO;  [facility];  Service: Gastroenterology;  Laterality: N/A;  ? CORONARY ARTERY BYPASS GRAFT  1994  ? LIMA to LAD, SVG to RCA, SVG to diagonal  ? ENDOSCOPIC MUCOSAL RESECTION  09/01/2021  ? Procedure: ENDOSCOPIC MUCOSAL RESECTION;  Surgeon: CiLavena BullionDO;  [facility];  Service: Gastroenterology;;  ? ESOPHAGOGASTRODUODENOSCOPY (EGD) WITH PROPOFOL N/A 09/01/2021  ? Procedure: ESOPHAGOGASTRODUODENOSCOPY (EGD) WITH PROPOFOL;  Surgeon: CiLavena BullionDO;  [facility];  Service: Gastroenterology;  Laterality: N/A;  ? HEMOSTASIS CLIP PLACEMENT  09/01/2021  ? Procedure: HEMOSTASIS CLIP PLACEMENT;  Surgeon: CiLavena BullionDO;  [facility] Service: G[facility]ology;;  ? HOT HEMOSTASIS N/A 09/01/2021  ? Procedure: HOT HEMOSTASIS (ARGON PLASMA COAGULATION/BICAP);  Surgeon: CiLavena BullionDO;  [facility];  Service: Gastroenterology;  Laterality: N/A;  ? POLYPECTOMY  09/01/2021  ? Procedure: POLYPECTOMY;  Surgeon: CiLavena BullionDO;  [facility];  Service: Gastroenterology;;  ? [place]  09/01/2021  ? Procedure: SUBMUCOSAL LIFTING INJECTION;  Surgeon: CiLavena BullionDO;  [facility];  Service: Gastroenterology;;  ? TONSILLECTOMY    ? TRANSURETHRAL RESECTION OF BLADDER TUMOR N/A 09/09/2018  ? Procedure: TRANSURETHRAL RESECTION OF BLADDER TUMOR (TURBT) WITH INSTILLATION OF POST OPERATIVE CHEMOTHERAPY;  Surgeon: OtKarsten Ro  Elta Guadeloupe, MD;  Location: [facility];  Service: Urology;  Laterality: N/A;  ? ? reports that he quit smoking about 29 years ago. His smoking use included cigarettes. He started smoking about 67 years ago. He has a 120.00 pack-year smoking history. He has never used smokeless tobacco. He reports that he does not drink alcohol and does not use drugs. ?family history includes Cancer in his brother; Hypertension  in an other family member. ?Allergies  ?Allergen Reactions  ? Lipitor [Atorvastatin] Other (See Comments)  ?  myalgia  ? ? ?Review of Systems  ?Constitutional:  Negative for chills and fever.  ?Respiratory:  Negative for shortness of breath.   ?Cardiovascular:  Negative for chest pain.  ?Musculoskeletal:  Positive for back pain.  ?Neurological:  Negative for dizziness and headaches.  ? ?  ?Objective:  ?  ? ?BP 102/60 (BP Location: Left Arm, Patient Position: Sitting, Cuff Size: Normal)   Pulse 72   Temp 97.9 ?F (36.6 ?C) (Oral)   Ht '6\' 1"'$  (1.854 m)   Wt 231 lb 14.4 oz (105.2 kg)   SpO2 96%   BMI 30.60 kg/m?  ? ? ?Physical Exam ?Vitals reviewed.  ?Constitutional:   ?   Appearance: Normal appearance.  ?Cardiovascular:  ?   Rate and Rhythm: Normal rate and regular rhythm.  ?Pulmonary:  ?   Effort: Pulmonary effort is normal.  ?   Breath sounds: Normal breath sounds.  ?Musculoskeletal:  ?   Right lower leg: No edema.  ?   Left lower leg: No edema.  ?Neurological:  ?   General: No focal deficit present.  ?   Mental Status: He is alert.  ? ? ? ?No results found for any visits on 02/18/22. ? ? ? ?The ASCVD Risk score (Arnett DK, et al., 2019) failed to calculate for the following reasons: ?  The valid total cholesterol range is 130 to 320 mg/dL ? ?  ?Assessment & Plan:  ? ?Problem List Items Addressed This Visit   ? ?  ? Unprioritized  ? Iron deficiency anemia - Primary  ? Relevant Orders  ? CBC with Differential/Platelet  ? Ferritin  ? Iron and TIBC  ? Chronic back pain  ? Relevant Medications  ? HYDROcodone-acetaminophen (NORCO) 10-325 MG tablet  ?-Refill hydrocodone 10 mg #90.  He takes 1 daily. ? ?-Check iron studies above including ferritin, iron/TIBC, and repeat CBC. ?-If ferritin remains low consider iron infusion ?-Routine follow-up in 6 months and sooner as needed ? ?Return in about 6 months (around 08/21/2022).  ? ? ?Carolann Littler, MD ? ?

## 2022-02-19 LAB — IRON AND TIBC
Iron Saturation: 20 % (ref 15–55)
Iron: 44 ug/dL (ref 38–169)
Total Iron Binding Capacity: 225 ug/dL — ABNORMAL LOW (ref 250–450)
UIBC: 181 ug/dL (ref 111–343)

## 2022-03-19 ENCOUNTER — Other Ambulatory Visit: Payer: Self-pay | Admitting: Family Medicine

## 2022-04-18 ENCOUNTER — Other Ambulatory Visit: Payer: Self-pay | Admitting: Gastroenterology

## 2022-05-19 ENCOUNTER — Telehealth: Payer: Self-pay | Admitting: Family Medicine

## 2022-05-19 MED ORDER — HYDROCODONE-ACETAMINOPHEN 10-325 MG PO TABS: ORAL_TABLET | ORAL | 0 refills | Status: DC

## 2022-05-19 NOTE — Telephone Encounter (Signed)
Pt called to request a 90 day refill of the: HYDROcodone-acetaminophen (NORCO) 10-325 MG tablet  Last OV:  02/18/22 Next OV: 08/21/22  Please advise.  [place], [place] Phone:  [phone]  Fax:  [phone]

## 2022-06-11 ENCOUNTER — Other Ambulatory Visit: Payer: Self-pay

## 2022-06-11 MED ORDER — LISINOPRIL 10 MG PO TABS: 10.0000 mg | ORAL_TABLET | Freq: Every day | ORAL | 0 refills | Status: DC

## 2022-06-11 MED ORDER — ROSUVASTATIN CALCIUM 20 MG PO TABS: 20.0000 mg | ORAL_TABLET | Freq: Every day | ORAL | 0 refills | Status: DC

## 2022-06-11 NOTE — Addendum Note (Signed)
Addended by: Nilda Riggs on: 06/11/2022 09:45 AM   Modules accepted: Orders

## 2022-06-20 ENCOUNTER — Other Ambulatory Visit: Payer: Self-pay | Admitting: Gastroenterology

## 2022-06-20 ENCOUNTER — Other Ambulatory Visit: Payer: Self-pay | Admitting: Family Medicine

## 2022-06-23 ENCOUNTER — Other Ambulatory Visit: Payer: Self-pay | Admitting: Family Medicine

## 2022-06-30 ENCOUNTER — Ambulatory Visit: Payer: Medicare Other

## 2022-07-22 ENCOUNTER — Other Ambulatory Visit: Payer: Self-pay | Admitting: Family Medicine

## 2022-07-28 ENCOUNTER — Telehealth: Payer: Self-pay | Admitting: Family Medicine

## 2022-07-28 NOTE — Telephone Encounter (Signed)
Left message for patient to call back and schedule Medicare Annual Wellness Visit (AWV) either virtually or in office. Left  my jabber number [phone]   Last AWV 06/27/21 please schedule with Nurse Health Adviser   45 min for awv-i and in office appointments 30 min for awv-s  phone/virtual appointments  

## 2022-08-13 ENCOUNTER — Encounter (HOSPITAL_COMMUNITY): Payer: Self-pay | Admitting: Emergency Medicine

## 2022-08-13 ENCOUNTER — Other Ambulatory Visit: Payer: Self-pay

## 2022-08-13 ENCOUNTER — Emergency Department (HOSPITAL_COMMUNITY): Payer: Medicare Other

## 2022-08-13 ENCOUNTER — Observation Stay (HOSPITAL_COMMUNITY): Payer: Medicare Other

## 2022-08-13 ENCOUNTER — Observation Stay (HOSPITAL_COMMUNITY)
Admission: EM | Admit: 2022-08-13 | Discharge: 2022-08-16 | Disposition: A | Discharge status: Hospice | Payer: Medicare Other | Attending: Family Medicine | Admitting: Family Medicine

## 2022-08-13 DIAGNOSIS — I2510 Atherosclerotic heart disease of native coronary artery without angina pectoris: Secondary | ICD-10-CM | POA: Diagnosis not present

## 2022-08-13 DIAGNOSIS — I130 Hypertensive heart and chronic kidney disease with heart failure and stage 1 through stage 4 chronic kidney disease, or unspecified chronic kidney disease: Secondary | ICD-10-CM | POA: Insufficient documentation

## 2022-08-13 DIAGNOSIS — E785 Hyperlipidemia, unspecified: Secondary | ICD-10-CM | POA: Diagnosis present

## 2022-08-13 DIAGNOSIS — I25810 Atherosclerosis of coronary artery bypass graft(s) without angina pectoris: Secondary | ICD-10-CM

## 2022-08-13 DIAGNOSIS — Z1152 Encounter for screening for COVID-19: Secondary | ICD-10-CM | POA: Diagnosis not present

## 2022-08-13 DIAGNOSIS — Z87891 Personal history of nicotine dependence: Secondary | ICD-10-CM | POA: Insufficient documentation

## 2022-08-13 DIAGNOSIS — Z79899 Other long term (current) drug therapy: Secondary | ICD-10-CM | POA: Diagnosis not present

## 2022-08-13 DIAGNOSIS — R0602 Shortness of breath: Secondary | ICD-10-CM | POA: Diagnosis present

## 2022-08-13 DIAGNOSIS — R918 Other nonspecific abnormal finding of lung field: Secondary | ICD-10-CM | POA: Insufficient documentation

## 2022-08-13 DIAGNOSIS — Z951 Presence of aortocoronary bypass graft: Secondary | ICD-10-CM | POA: Insufficient documentation

## 2022-08-13 DIAGNOSIS — I5021 Acute systolic (congestive) heart failure: Secondary | ICD-10-CM | POA: Diagnosis not present

## 2022-08-13 DIAGNOSIS — G629 Polyneuropathy, unspecified: Secondary | ICD-10-CM | POA: Diagnosis not present

## 2022-08-13 DIAGNOSIS — I4891 Unspecified atrial fibrillation: Principal | ICD-10-CM | POA: Diagnosis present

## 2022-08-13 DIAGNOSIS — R9431 Abnormal electrocardiogram [ECG] [EKG]: Secondary | ICD-10-CM | POA: Insufficient documentation

## 2022-08-13 DIAGNOSIS — E041 Nontoxic single thyroid nodule: Secondary | ICD-10-CM | POA: Diagnosis not present

## 2022-08-13 DIAGNOSIS — Z7982 Long term (current) use of aspirin: Secondary | ICD-10-CM | POA: Diagnosis not present

## 2022-08-13 DIAGNOSIS — I10 Essential (primary) hypertension: Secondary | ICD-10-CM | POA: Diagnosis not present

## 2022-08-13 DIAGNOSIS — N1831 Chronic kidney disease, stage 3a: Secondary | ICD-10-CM | POA: Insufficient documentation

## 2022-08-13 DIAGNOSIS — D509 Iron deficiency anemia, unspecified: Secondary | ICD-10-CM | POA: Diagnosis not present

## 2022-08-13 DIAGNOSIS — Z8551 Personal history of malignant neoplasm of bladder: Secondary | ICD-10-CM | POA: Insufficient documentation

## 2022-08-13 HISTORY — DX: Heart failure, unspecified: I50.9

## 2022-08-13 LAB — CBC WITH DIFFERENTIAL/PLATELET
Abs Immature Granulocytes: 0.01 10*3/uL (ref 0.00–0.07)
Basophils Absolute: 0 10*3/uL (ref 0.0–0.1)
Basophils Relative: 1 %
Eosinophils Absolute: 0.1 10*3/uL (ref 0.0–0.5)
Eosinophils Relative: 1 %
HCT: 34.9 % — ABNORMAL LOW (ref 39.0–52.0)
Hemoglobin: 10.7 g/dL — ABNORMAL LOW (ref 13.0–17.0)
Immature Granulocytes: 0 %
Lymphocytes Relative: 12 %
Lymphs Abs: 0.7 10*3/uL (ref 0.7–4.0)
MCH: 29.1 pg (ref 26.0–34.0)
MCHC: 30.7 g/dL (ref 30.0–36.0)
MCV: 94.8 fL (ref 80.0–100.0)
Monocytes Absolute: 0.3 10*3/uL (ref 0.1–1.0)
Monocytes Relative: 6 %
Neutro Abs: 4.5 10*3/uL (ref 1.7–7.7)
Neutrophils Relative %: 80 %
Platelets: 176 10*3/uL (ref 150–400)
RBC: 3.68 MIL/uL — ABNORMAL LOW (ref 4.22–5.81)
RDW: 15.1 % (ref 11.5–15.5)
WBC: 5.6 10*3/uL (ref 4.0–10.5)
nRBC: 0 % (ref 0.0–0.2)

## 2022-08-13 LAB — BASIC METABOLIC PANEL
Anion gap: 7 (ref 5–15)
BUN: 15 mg/dL (ref 8–23)
CO2: 26 mmol/L (ref 22–32)
Calcium: 8.1 mg/dL — ABNORMAL LOW (ref 8.9–10.3)
Chloride: 100 mmol/L (ref 98–111)
Creatinine, Ser: 1.27 mg/dL — ABNORMAL HIGH (ref 0.61–1.24)
GFR, Estimated: 57 mL/min — ABNORMAL LOW (ref >60)
Glucose, Bld: 124 mg/dL — ABNORMAL HIGH (ref 70–99)
Potassium: 3.6 mmol/L (ref 3.5–5.1)
Sodium: 133 mmol/L — ABNORMAL LOW (ref 135–145)

## 2022-08-13 LAB — RESP PANEL BY RT-PCR (FLU A&B, COVID) ARPGX2
Influenza A by PCR: NEGATIVE
Influenza B by PCR: NEGATIVE
SARS Coronavirus 2 by RT PCR: NEGATIVE

## 2022-08-13 LAB — MAGNESIUM: Magnesium: 1.9 mg/dL (ref 1.7–2.4)

## 2022-08-13 LAB — TROPONIN I (HIGH SENSITIVITY)
Troponin I (High Sensitivity): 10 ng/L (ref <18)
Troponin I (High Sensitivity): 10 ng/L (ref <18)

## 2022-08-13 LAB — MRSA NEXT GEN BY PCR, NASAL: MRSA by PCR Next Gen: NOT DETECTED

## 2022-08-13 LAB — BRAIN NATRIURETIC PEPTIDE: B Natriuretic Peptide: 550 pg/mL — ABNORMAL HIGH (ref 0.0–100.0)

## 2022-08-13 LAB — TSH: TSH: 2.607 u[IU]/mL (ref 0.350–4.500)

## 2022-08-13 MED ORDER — OMEGA-3-ACID ETHYL ESTERS 1 G PO CAPS
1.0000 g | ORAL_CAPSULE | Freq: Two times a day (BID) | ORAL | Status: DC
  Administered 2022-08-13 – 2022-08-14 (×2): 1 g via ORAL
  Filled 2022-08-13 (×2): qty 1

## 2022-08-13 MED ORDER — GABAPENTIN 300 MG PO CAPS
300.0000 mg | ORAL_CAPSULE | Freq: Three times a day (TID) | ORAL | Status: DC
  Administered 2022-08-13 – 2022-08-16 (×8): 300 mg via ORAL
  Filled 2022-08-13 (×8): qty 1

## 2022-08-13 MED ORDER — FUROSEMIDE 10 MG/ML IJ SOLN
40.0000 mg | Freq: Once | INTRAMUSCULAR | Status: AC
  Administered 2022-08-13: 40 mg via INTRAVENOUS
  Filled 2022-08-13: qty 4

## 2022-08-13 MED ORDER — FUROSEMIDE 10 MG/ML IJ SOLN
40.0000 mg | Freq: Every day | INTRAMUSCULAR | Status: DC
  Administered 2022-08-14: 40 mg via INTRAVENOUS
  Filled 2022-08-13: qty 4

## 2022-08-13 MED ORDER — ALBUTEROL SULFATE (2.5 MG/3ML) 0.083% IN NEBU: 2.5000 mg | INHALATION_SOLUTION | RESPIRATORY_TRACT | Status: DC | PRN

## 2022-08-13 MED ORDER — IPRATROPIUM-ALBUTEROL 0.5-2.5 (3) MG/3ML IN SOLN: 3.0000 mL | Freq: Once | RESPIRATORY_TRACT | Status: DC

## 2022-08-13 MED ORDER — NITROGLYCERIN 0.4 MG SL SUBL: 0.4000 mg | SUBLINGUAL_TABLET | SUBLINGUAL | Status: DC | PRN

## 2022-08-13 MED ORDER — METOPROLOL TARTRATE 25 MG PO TABS
25.0000 mg | ORAL_TABLET | Freq: Two times a day (BID) | ORAL | Status: DC
  Administered 2022-08-13 – 2022-08-14 (×2): 25 mg via ORAL
  Filled 2022-08-13 (×2): qty 1

## 2022-08-13 MED ORDER — PANTOPRAZOLE SODIUM 40 MG PO TBEC
40.0000 mg | DELAYED_RELEASE_TABLET | Freq: Every day | ORAL | Status: DC
  Administered 2022-08-14: 40 mg via ORAL
  Filled 2022-08-13: qty 1

## 2022-08-13 MED ORDER — HEPARIN BOLUS VIA INFUSION
5000.0000 [IU] | Freq: Once | INTRAVENOUS | Status: AC
  Administered 2022-08-13: 5000 [IU] via INTRAVENOUS

## 2022-08-13 MED ORDER — ROSUVASTATIN CALCIUM 20 MG PO TABS
20.0000 mg | ORAL_TABLET | Freq: Every day | ORAL | Status: DC
  Administered 2022-08-14 – 2022-08-16 (×3): 20 mg via ORAL
  Filled 2022-08-13 (×3): qty 1

## 2022-08-13 MED ORDER — HEPARIN (PORCINE) 25000 UT/250ML-% IV SOLN
2000.0000 [IU]/h | INTRAVENOUS | Status: DC
  Administered 2022-08-13: 1400 [IU]/h via INTRAVENOUS
  Administered 2022-08-14: 1700 [IU]/h via INTRAVENOUS
  Filled 2022-08-13 (×2): qty 250

## 2022-08-13 MED ORDER — DILTIAZEM HCL-DEXTROSE 125-5 MG/125ML-% IV SOLN (PREMIX)
5.0000 mg/h | INTRAVENOUS | Status: DC
  Administered 2022-08-13: 5 mg/h via INTRAVENOUS
  Filled 2022-08-13: qty 125

## 2022-08-13 MED ORDER — ACETAMINOPHEN 650 MG RE SUPP: 650.0000 mg | Freq: Four times a day (QID) | RECTAL | Status: DC | PRN

## 2022-08-13 MED ORDER — FERROUS SULFATE 325 (65 FE) MG PO TABS
325.0000 mg | ORAL_TABLET | Freq: Every day | ORAL | Status: DC
  Administered 2022-08-14 – 2022-08-16 (×3): 325 mg via ORAL
  Filled 2022-08-13 (×3): qty 1

## 2022-08-13 MED ORDER — POTASSIUM CHLORIDE CRYS ER 20 MEQ PO TBCR
40.0000 meq | EXTENDED_RELEASE_TABLET | Freq: Four times a day (QID) | ORAL | Status: AC
  Administered 2022-08-13 (×2): 40 meq via ORAL
  Filled 2022-08-13 (×2): qty 2

## 2022-08-13 MED ORDER — METOPROLOL TARTRATE 5 MG/5ML IV SOLN
5.0000 mg | Freq: Once | INTRAVENOUS | Status: AC
  Administered 2022-08-13: 5 mg via INTRAVENOUS
  Filled 2022-08-13: qty 5

## 2022-08-13 MED ORDER — DILTIAZEM LOAD VIA INFUSION
10.0000 mg | Freq: Once | INTRAVENOUS | Status: AC
  Administered 2022-08-13: 10 mg via INTRAVENOUS
  Filled 2022-08-13: qty 10

## 2022-08-13 MED ORDER — ACETAMINOPHEN 325 MG PO TABS
650.0000 mg | ORAL_TABLET | Freq: Four times a day (QID) | ORAL | Status: DC | PRN
  Administered 2022-08-14: 650 mg via ORAL
  Filled 2022-08-13: qty 2

## 2022-08-13 NOTE — ED Triage Notes (Signed)
Pt arrived via [facility] c/o SOB and generalized weakness x 2-3 weeks. Hx of CHF. Per EMS, pt was hypotensive 96/68 SpO2 97% RA. Also per EMS, pt was in Afib on the their monitor,

## 2022-08-13 NOTE — ED Notes (Signed)
Pt accidentally ripped RFA IV out, Heparin paused at this time until IV access re-obtained

## 2022-08-13 NOTE — ED Notes (Signed)
Pt ambulated to the bathroom without assistance. 

## 2022-08-13 NOTE — ED Notes (Signed)
Patient transported to CT 

## 2022-08-13 NOTE — ED Provider Notes (Signed)
[facility] EMERGENCY DEPARTMENT Provider Note   CSN: [id number] Arrival date & time: 08/13/22  1057     History  Chief Complaint  Patient presents with   Shortness of Breath    Rodney Barnett is a 81 y.o. male w/ xh of bladder cancer s/p TURB and chemo, CKD, cHTN, HLD, GI bleed s/p AVM ablation, presenting to ED with shortness of breath.  Reports orthopnea and dyspnea on exertion about 2-3 days.  Persistent coughing last night in bed.  Worsening leg swelling symmetrically.  Denies chest pain, dark or bloody stools.  Denies hx of A Fib, takes aspirin 81 mg, no other a/c.  HPI     Home Medications Prior to Admission medications   Medication Sig Start Date End Date Taking? Authorizing Provider  aspirin 81 MG tablet Take 81 mg by mouth daily.   Yes [provider]  gabapentin (NEURONTIN) 300 MG capsule TAKE (2) CAPSULES BY MOUTH THREE TIMES DAILY. 07/22/22  Yes Burchette, Alinda Sierras, MD  Iron, Ferrous Sulfate, 325 (65 Fe) MG TABS Take 1 tablet by mouth daily. 10/17/21  Yes Thornton Park, MD  isosorbide mononitrate (IMDUR) 30 MG 24 hr tablet TAKE 1 TABLET ONCE DAILY. 06/23/22  Yes Burchette, Alinda Sierras, MD  lisinopril (ZESTRIL) 10 MG tablet Take 1 tablet (10 mg total) by mouth daily. 06/11/22  Yes Burchette, Alinda Sierras, MD  metoprolol tartrate (LOPRESSOR) 25 MG tablet TAKE (1) TABLET TWICE DAILY. 06/22/22  Yes Burchette, Alinda Sierras, MD  nitroGLYCERIN (NITROSTAT) 0.4 MG SL tablet Place 1 tablet (0.4 mg total) under the tongue every 5 (five) minutes x 3 doses as needed for chest pain (If no relief after 3rd dose, proceed to the ED for an evaluation or call 911). 08/20/21  Yes Burchette, Alinda Sierras, MD  Omega-3 Fatty Acids (FISH OIL) 1000 MG CPDR Take 1,000 mg by mouth daily.   Yes [provider]  pantoprazole (PROTONIX) 40 MG tablet TAKE 1 TABLET ONCE DAILY. 06/22/22  Yes Thornton Park, MD  rosuvastatin (CRESTOR) 20 MG tablet Take 1 tablet (20 mg total) by mouth daily. 06/11/22  Yes  Burchette, Alinda Sierras, MD  HYDROcodone-acetaminophen ([place]) 10-325 MG tablet TAKE (1) TABLET BY MOUTH EVERY SIX HOURS AS NEEDED Patient not taking: Reported on 08/13/2022 05/19/22   [name], MD      Allergies    Lipitor [atorvastatin]    Review of Systems   Review of Systems  Physical Exam Updated Vital Signs BP 126/83 (BP Location: Right Arm)   Pulse 97   Temp 97.8 F (36.6 C) (Oral)   Resp 20   Ht '6\' 1"'$  (1.854 m)   Wt 105.2 kg   SpO2 98%   BMI 30.60 kg/m  Physical Exam Constitutional:      General: He is not in acute distress. HENT:     Head: Normocephalic and atraumatic.  Eyes:     Conjunctiva/sclera: Conjunctivae normal.     Pupils: Pupils are equal, round, and reactive to light.  Cardiovascular:     Rate and Rhythm: Tachycardia present. Rhythm irregular.  Pulmonary:     Effort: Pulmonary effort is normal. No respiratory distress.  Abdominal:     General: There is no distension.     Tenderness: There is no abdominal tenderness.  Musculoskeletal:     Right lower leg: Edema present.     Left lower leg: Edema present.  Skin:    General: Skin is warm and dry.  Neurological:  General: No focal deficit present.     Mental Status: He is alert. Mental status is at baseline.  Psychiatric:        Mood and Affect: Mood normal.        Behavior: Behavior normal.     ED Results / Procedures / Treatments   Labs (all labs ordered are listed, but only abnormal results are displayed) Labs Reviewed  BASIC METABOLIC PANEL - Abnormal; Notable for the following components:      Result Value   Sodium 133 (*)    Glucose, Bld 124 (*)    Creatinine, Ser 1.27 (*)    Calcium 8.1 (*)    GFR, Estimated 57 (*)    All other components within normal limits  CBC WITH DIFFERENTIAL/PLATELET - Abnormal; Notable for the following components:   RBC 3.68 (*)    Hemoglobin 10.7 (*)    HCT 34.9 (*)    All other components within normal limits  BRAIN NATRIURETIC PEPTIDE -  Abnormal; Notable for the following components:   B Natriuretic Peptide 550.0 (*)    All other components within normal limits  RESP PANEL BY RT-PCR (FLU A&B, COVID) ARPGX2  MAGNESIUM  TROPONIN I (HIGH SENSITIVITY)  TROPONIN I (HIGH SENSITIVITY)    EKG EKG Interpretation  Date/Time:  Thursday August 13 2022 11:35:01 EST Ventricular Rate:  94 PR Interval:    QRS Duration: 89 QT Interval:  475 QTC Calculation: 531 R Axis:   -25 Text Interpretation: Atrial fibrillation Borderline left axis deviation Low voltage, precordial leads Anteroseptal infarct, old Minimal ST depression, inferior leads Prolonged QT interval Confirmed by Octaviano Glow [phone]) on 08/13/2022 11:42:52 AM  Radiology CT Chest Wo Contrast  Result Date: 08/13/2022 CLINICAL DATA:  Abnormal chest radiograph with opacity in the medial RIGHT lower lobe EXAM: CT CHEST WITHOUT CONTRAST TECHNIQUE: Multidetector CT imaging of the chest was performed following the standard protocol without IV contrast. RADIATION DOSE REDUCTION: This exam was performed according to the departmental dose-optimization program which includes automated exposure control, adjustment of the mA and/or kV according to patient size and/or use of iterative reconstruction technique. COMPARISON:  Chest radiograph 08/13/2022 FINDINGS: Cardiovascular: Atherosclerotic calcifications aortic arch, coronary arteries, and proximal great vessels. Post CABG. Aorta normal caliber. Heart size normal. No pericardial effusion. Mediastinum/Nodes: Question mass extending from inferior aspect of LEFT thyroid lobe, 2.8 x 1.4 cm image 26. Enlarged RIGHT paratracheal lymph node 11 mm image 51. Additional subcarinal nodal enlargement 14 mm image 83. Esophagus unremarkable. Lungs/Pleura: Peribronchial thickening. Interseptal thickening upper lungs with scattered subpleural interstitial thickening bilaterally. Mass identified superior segment RIGHT lower lobe 8.3 x 5.8 x 6.7 cm likely  representing a large primary pulmonary neoplasm. Mass is confluence with RIGHT hilar adenopathy. Obstruction of the RIGHT lower lobe bronchus. Some infiltrate peripheral to mass. Calcified granuloma anterior RIGHT upper lobe. No additional masses. No pleural effusion. Upper Abdomen: Cholelithiasis. Splenule at splenic hilum. Low-attenuation lesion LEFT lobe liver anteriorly 14 mm image 140 consistent with cyst. Musculoskeletal: Post median sternotomy.  No acute osseous findings. 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 Mass is confluent with RIGHT hilar adenopathy with additional enlarged mediastinal nodes. Obstruction of the RIGHT lower lobe bronchus with peripheral infiltrate. 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 Cholelithiasis. Scattered atherosclerotic disease changes including coronary arteries post CABG. Suspected LEFT inferior thyroid mass 2.8 x 1.4 cm; recommend nonemergent thyroid ultrasound follow-up. Aortic Atherosclerosis (ICD10-I70.0).  Emphysema (ICD10-J43.9). Findings called to Dr. Langston Masker on 08/13/2022 at 1320 hours. Electronically Signed   By: Lavonia Dana M.D.   On: 08/13/2022 13:22   DG Chest 2 View  Result Date: 08/13/2022 CLINICAL DATA:  Cough and shortness of breath EXAM: CHEST - 2 VIEW COMPARISON:  Chest radiograph dated 04/03/2022 FINDINGS: Lines/tubes: Median sternotomy wires are nondisplaced. Fracture of the inferior most sternotomy wire. Endoscopy clip projects over the stomach. Chest: Persistent rounded medial right lower lobe opacity. New patchy peripheral right lung opacities. Left lingular linear atelectasis/scarring. Pleura: No pneumothorax or pleural effusion. Heart/mediastinum: Similar postsurgical cardiomediastinal silhouette. Bones: No acute osseous abnormality. IMPRESSION: 1. Persistent rounded medial right lower lobe opacity suspicious for mass lesion.  Recommend contrast enhanced chest CT for further evaluation. 2. New patchy peripheral right lung opacities. These can also be evaluated by CT. Electronically Signed   By: Darrin Nipper M.D.   On: 08/13/2022 12:05    Procedures .Critical Care  Performed by: Wyvonnia Dusky, MD Authorized by: Wyvonnia Dusky, MD   Critical care provider statement:    Critical care time (minutes):  40   Critical care time was exclusive of:  Separately billable procedures and treating other patients   Critical care was necessary to treat or prevent imminent or life-threatening deterioration of the following conditions:  Circulatory failure   Critical care was time spent personally by me on the following activities:  Ordering and performing treatments and interventions, ordering and review of laboratory studies, ordering and review of radiographic studies, pulse oximetry, review of old charts, examination of patient and evaluation of patient's response to treatment   Care discussed with: admitting provider       Medications Ordered in ED Medications  furosemide (LASIX) injection 40 mg (has no administration in time range)  metoprolol tartrate (LOPRESSOR) injection 5 mg (has no administration in time range)    ED Course/ Medical Decision Making/ A&P Clinical Course as of 08/13/22 1538  Thu Aug 13, 2022  1531 Patient updated about pulmonary mass - I spoke to Dr Melvyn Novas from pulmonology who reviewed images; no intervention or bronchoscopy planned at this time, but patient will need outpatient pulm and oncology follow up.  For now we will admit patient for A Fib with RVR and diuresis for possible CHF in this setting.  IV metoprolol ordered.  Patient updated about all diagnosis including mass.  Admitted to hospitalist. [MT]    Clinical Course User Index [MT] Juandavid Dallman, Carola Rhine, MD                           Medical Decision Making Amount and/or Complexity of Data Reviewed Labs: ordered. Radiology:  ordered. ECG/medicine tests: ordered.  Risk Prescription drug management. Decision regarding hospitalization.   This patient presents to the ED with concern for shortness of breath. This involves an extensive number of treatment options, and is a complaint that carries with it a high risk of complications and morbidity.  The differential diagnosis includes pulm edema vs pleural effusion vs anemia vs PNA vs viral URI vs chf vs other  Co-morbidities that complicate the patient evaluation: hx of CHF, GI bleed, at risk of recurrence  External records from outside source obtained and reviewed including hospital discharge summary from 2022 for GI bleed requiring transfusions, EGD and AVM ablation  He appears to be in new onset (?) A Fib here, HR controlled - he is not aware of prior hx of A  Fib.  Hx of CAD and CABG, taken off plavix, continues on aspirin  I ordered and personally interpreted labs.  The pertinent results include:  BNP elevated  I ordered imaging studies including dg chest, CT chest I independently visualized and interpreted imaging which showed right sided pulmonary mass, thyroid nodule I agree with the radiologist interpretation  The patient was maintained on a cardiac monitor.  I personally viewed and interpreted the cardiac monitored which showed an underlying rhythm of: A fib with HR 100's to 120's  Per my interpretation the patient's ECG shows A fib without acute ischemia  I ordered medication including IV metoprolol and IV lasix for pulmonary edema and A Fib with RVR  I have reviewed the patients home medicines and have made adjustments as needed  Test Considered: doubt acute PE  I requested consultation with the pulmology,  and discussed lab and imaging findings as well as pertinent plan - they recommend: see ED course for discussion with Dr Melvyn Novas - no interventions indicated at this time for pulmonary mass.  After the interventions noted above, I reevaluated the  patient and found that they have: stayed the same   Dispostion:  After consideration of the diagnostic results and the patients response to treatment, I feel that the patent would benefit from medical admission.        Final Clinical Impression(s) / ED Diagnoses Final diagnoses:  Atrial fibrillation with RVR ([place])  Pulmonary mass  Shortness of breath    Rx / DC Orders ED Discharge Orders     None         Jenina Moening, Carola Rhine, MD 08/13/22 1538

## 2022-08-13 NOTE — H&P (Signed)
TRH H&P   Patient Demographics:    Rodney Barnett, is a 81 y.o. male  MRN: [id number]   DOB - Jul 02, 1941  Admit Date - 08/13/2022  Outpatient Primary MD for the patient is [name], MD  Referring MD/NP/PA: DR Langston Masker  Outpatient Specialists: cardiolgoy DR Lyman    Patient coming from: home  Chief Complaint  Patient presents with   Shortness of Breath      HPI:    Rodney Barnett  is a 81 y.o. male, with past medical history of CAD status post CABG 1994,bladder cance, history of GI bleed in November 2022 due to AVM status post cautery , s/p TURBT and chemo in 2019, CKD-3A, chronic back pain, HTN and HLD . -Patient presents to ED today secondary to shortness of breath, reports progressive over the last few days, he does report orthopnea and exertional dyspnea as well, he does report persistent coughing, with some productive phlegm, he denies any chest pain, fever or chills, he reports lower extremity edema, usually worse at the end of the day upon prolonged standing, and improved with leg elevation and rest, he denies any chest pain, blood in stool or urine. -In ED he was noted to be in A-fib with RVR, new onset, he was dyspneic but with no oxygen requirement, CT chest significant for mass (new ), and thyroid mass, as well creatinine at 1.27, at baseline, hemoglobin 10.7, at baseline, [facility] hospitalist consulted to admit    Review of systems:     A full 10 point Review of Systems was done, except as stated above, all other Review of Systems were negative.   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   Chronic back pain    Chronic rhinitis    Coronary atherosclerosis of native coronary artery    Multivessel, LVEF 55-60%, basal inferior hypokinesis, known graft disease   Diverticulosis     Erectile dysfunction    Essential hypertension    History of bladder cancer    Insomnia    Internal hemorrhoids    Lipoma    Mixed hyperlipidemia    Peripheral neuropathy    Tubular adenoma of colon       Past Surgical History:  Procedure Laterality Date   COLONOSCOPY WITH PROPOFOL N/A 09/01/2021   Procedure: COLONOSCOPY WITH PROPOFOL;  Surgeon: Lavena Bullion, DO;  Location: [facility];  Service: Gastroenterology;  Laterality: N/A;   CORONARY ARTERY BYPASS GRAFT  1994   LIMA to LAD, SVG to RCA, SVG to diagonal   ENDOSCOPIC MUCOSAL RESECTION  09/01/2021   Procedure: ENDOSCOPIC MUCOSAL RESECTION;  Surgeon: Lavena Bullion, DO;  Location: [facility];  Service: Gastroenterology;;   ESOPHAGOGASTRODUODENOSCOPY (EGD) WITH PROPOFOL N/A 09/01/2021   Procedure: ESOPHAGOGASTRODUODENOSCOPY (EGD) WITH PROPOFOL;  Surgeon: Bryan Lemma,  Dominic Pea, DO;  Location: [facility];  Service: Gastroenterology;  Laterality: N/A;   HEMOSTASIS CLIP PLACEMENT  09/01/2021   Procedure: HEMOSTASIS CLIP PLACEMENT;  Surgeon: Lavena Bullion, DO;  Location: [facility];  Service: Gastroenterology;;   HOT HEMOSTASIS N/A 09/01/2021   Procedure: HOT HEMOSTASIS (ARGON PLASMA COAGULATION/BICAP);  Surgeon: Lavena Bullion, DO;  Location: [facility];  Service: Gastroenterology;  Laterality: N/A;   POLYPECTOMY  09/01/2021   Procedure: POLYPECTOMY;  Surgeon: Lavena Bullion, DO;  Location: [facility];  Service: Gastroenterology;;   SUBMUCOSAL LIFTING INJECTION  09/01/2021   Procedure: SUBMUCOSAL LIFTING INJECTION;  Surgeon: Lavena Bullion, DO;  Location: [facility];  Service: Gastroenterology;;   TONSILLECTOMY     TRANSURETHRAL RESECTION OF BLADDER TUMOR N/A 09/09/2018   Procedure: TRANSURETHRAL RESECTION OF BLADDER TUMOR (TURBT) WITH INSTILLATION OF POST OPERATIVE CHEMOTHERAPY;  Surgeon: Kathie Rhodes, MD;  Location: [facility];  Service: Urology;  Laterality: N/A;      Social History:     Social  History   Tobacco Use   Smoking status: Former    Packs/day: 3.00    Years: 40.00    Total pack years: 120.00    Types: Cigarettes    Start date: 10/05/1954    Quit date: 01/06/1993    Years since quitting: 29.6   Smokeless tobacco: Never  Substance Use Topics   Alcohol use: No    Alcohol/week: 0.0 standard drinks of alcohol       Family History :     Family History  Problem Relation Age of Onset   Hypertension Other    Cancer Brother        renal cancer      Home Medications:   Prior to Admission medications   Medication Sig Start Date End Date Taking? Authorizing Provider  aspirin 81 MG tablet Take 81 mg by mouth daily.   Yes [provider]  gabapentin (NEURONTIN) 300 MG capsule TAKE (2) CAPSULES BY MOUTH THREE TIMES DAILY. 07/22/22  Yes Burchette, Alinda Sierras, MD  Iron, Ferrous Sulfate, 325 (65 Fe) MG TABS Take 1 tablet by mouth daily. 10/17/21  Yes Thornton Park, MD  isosorbide mononitrate (IMDUR) 30 MG 24 hr tablet TAKE 1 TABLET ONCE DAILY. 06/23/22  Yes Burchette, Alinda Sierras, MD  lisinopril (ZESTRIL) 10 MG tablet Take 1 tablet (10 mg total) by mouth daily. 06/11/22  Yes Burchette, Alinda Sierras, MD  metoprolol tartrate (LOPRESSOR) 25 MG tablet TAKE (1) TABLET TWICE DAILY. 06/22/22  Yes Burchette, Alinda Sierras, MD  nitroGLYCERIN (NITROSTAT) 0.4 MG SL tablet Place 1 tablet (0.4 mg total) under the tongue every 5 (five) minutes x 3 doses as needed for chest pain (If no relief after 3rd dose, proceed to the ED for an evaluation or call 911). 08/20/21  Yes Burchette, Alinda Sierras, MD  Omega-3 Fatty Acids (FISH OIL) 1000 MG CPDR Take 1,000 mg by mouth daily.   Yes [provider]  pantoprazole (PROTONIX) 40 MG tablet TAKE 1 TABLET ONCE DAILY. 06/22/22  Yes Thornton Park, MD  rosuvastatin (CRESTOR) 20 MG tablet Take 1 tablet (20 mg total) by mouth daily. 06/11/22  Yes Burchette, Alinda Sierras, MD  HYDROcodone-acetaminophen (NORCO) 10-325 MG tablet TAKE (1) TABLET BY MOUTH EVERY SIX HOURS  AS NEEDED Patient not taking: Reported on 08/13/2022 05/19/22   [name], MD     Allergies:     Allergies  Allergen Reactions   Lipitor [Atorvastatin] Other (See Comments)    myalgia  Physical Exam:   Vitals  Blood pressure 126/83, pulse 97, temperature 97.8 F (36.6 C), temperature source Oral, resp. rate 20, height '6\' 1"'$  (1.854 m), weight 105.2 kg, SpO2 98 %.   1. General well developed male, laying in bed, no apparent distress  2. Normal affect and insight, Not Suicidal or Homicidal, Awake Alert, Oriented X 3.  3. No F.N deficits, ALL C.Nerves Intact, Strength 5/5 all 4 extremities, Sensation intact all 4 extremities, Plantars down going.  4. Ears and Eyes appear Normal, Conjunctivae clear, PERRLA. Moist Oral Mucosa.  5. Supple Neck, No JVD, No cervical lymphadenopathy appriciated, No Carotid Bruits.  6. Symmetrical Chest wall movement, Good air movement bilaterally, right lung wheezing and Rales  7.  Irregular, tachycardic, No Gallops, Rubs or Murmurs, No Parasternal Heave.  +2 edema  8. Positive Bowel Sounds, Abdomen Soft, No tenderness, No organomegaly appriciated,No rebound -guarding or rigidity.  9.  No Cyanosis, Normal Skin Turgor, No Skin Rash or Bruise.  10. Good muscle tone,  joints appear normal , no effusions, Normal ROM.     Data Review:    CBC Recent Labs  Lab 08/13/22 1231  WBC 5.6  HGB 10.7*  HCT 34.9*  PLT 176  MCV 94.8  MCH 29.1  MCHC 30.7  RDW 15.1  LYMPHSABS 0.7  MONOABS 0.3  EOSABS 0.1  BASOSABS 0.0   ------------------------------------------------------------------------------------------------------------------  Chemistries  Recent Labs  Lab 08/13/22 1231  NA 133*  K 3.6  CL 100  CO2 26  GLUCOSE 124*  BUN 15  CREATININE 1.27*  CALCIUM 8.1*  MG 1.9   ------------------------------------------------------------------------------------------------------------------ estimated creatinine clearance is 59.1  mL/min (A) (by C-G formula based on SCr of 1.27 mg/dL (H)). ------------------------------------------------------------------------------------------------------------------ No results for input(s): "TSH", "T4TOTAL", "T3FREE", "THYROIDAB" in the last 72 hours.  Invalid input(s): "FREET3"  Coagulation profile No results for input(s): "INR", "PROTIME" in the last 168 hours. ------------------------------------------------------------------------------------------------------------------- No results for input(s): "DDIMER" in the last 72 hours. -------------------------------------------------------------------------------------------------------------------  Cardiac Enzymes No results for input(s): "CKMB", "TROPONINI", "MYOGLOBIN" in the last 168 hours.  Invalid input(s): "CK" ------------------------------------------------------------------------------------------------------------------    Component Value Date/Time   BNP 550.0 (H) 08/13/2022 1231     ---------------------------------------------------------------------------------------------------------------  Urinalysis    Component Value Date/Time   COLORURINE yellow 05/14/2010 0900   APPEARANCEUR Clear 05/14/2010 0900   LABSPEC <1.005 05/14/2010 0900   PHURINE 6.0 05/14/2010 0900   HGBUR large 05/14/2010 0900   BILIRUBINUR negative 02/17/2021 1139   PROTEINUR Negative 02/17/2021 1139   UROBILINOGEN 0.2 02/17/2021 1139   UROBILINOGEN 0.2 05/14/2010 0900   NITRITE positive 02/17/2021 1139   NITRITE positive 05/14/2010 0900   LEUKOCYTESUR Large (3+) (A) 02/17/2021 1139    ----------------------------------------------------------------------------------------------------------------   Imaging Results:    CT Chest Wo Contrast  Result Date: 08/13/2022 CLINICAL DATA:  Abnormal chest radiograph with opacity in the medial RIGHT lower lobe EXAM: CT CHEST WITHOUT CONTRAST TECHNIQUE: Multidetector CT imaging of the chest was  performed following the standard protocol without IV contrast. RADIATION DOSE REDUCTION: This exam was performed according to the departmental dose-optimization program which includes automated exposure control, adjustment of the mA and/or kV according to patient size and/or use of iterative reconstruction technique. COMPARISON:  Chest radiograph 08/13/2022 FINDINGS: Cardiovascular: Atherosclerotic calcifications aortic arch, coronary arteries, and proximal great vessels. Post CABG. Aorta normal caliber. Heart size normal. No pericardial effusion. Mediastinum/Nodes: Question mass extending from inferior aspect of LEFT thyroid lobe, 2.8 x 1.4 cm image 26. Enlarged RIGHT paratracheal lymph node 11 mm image  51. Additional subcarinal nodal enlargement 14 mm image 83. Esophagus unremarkable. Lungs/Pleura: Peribronchial thickening. Interseptal thickening upper lungs with scattered subpleural interstitial thickening bilaterally. 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 Mass is confluence with RIGHT hilar adenopathy. Obstruction of the RIGHT lower lobe bronchus. Some infiltrate peripheral to mass. Calcified granuloma anterior RIGHT upper lobe. No additional masses. No pleural effusion. Upper Abdomen: Cholelithiasis. Splenule at splenic hilum. Low-attenuation lesion LEFT lobe liver anteriorly 14 mm image 140 consistent with cyst. Musculoskeletal: Post median sternotomy.  No acute osseous findings. 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 Mass is confluent with RIGHT hilar adenopathy with additional enlarged mediastinal nodes. Obstruction of the RIGHT lower lobe bronchus with peripheral infiltrate. 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 Cholelithiasis. Scattered atherosclerotic disease changes including coronary arteries post  CABG. Suspected LEFT inferior thyroid mass 2.8 x 1.4 cm; recommend nonemergent thyroid ultrasound follow-up. Aortic Atherosclerosis (ICD10-I70.0).  Emphysema (ICD10-J43.9). Findings called to Dr. Langston Masker on 08/13/2022 at 1320 hours. Electronically Signed   By: Lavonia Dana M.D.   On: 08/13/2022 13:22   DG Chest 2 View  Result Date: 08/13/2022 CLINICAL DATA:  Cough and shortness of breath EXAM: CHEST - 2 VIEW COMPARISON:  Chest radiograph dated 04/03/2022 FINDINGS: Lines/tubes: Median sternotomy wires are nondisplaced. Fracture of the inferior most sternotomy wire. Endoscopy clip projects over the stomach. Chest: Persistent rounded medial right lower lobe opacity. New patchy peripheral right lung opacities. Left lingular linear atelectasis/scarring. Pleura: No pneumothorax or pleural effusion. Heart/mediastinum: Similar postsurgical cardiomediastinal silhouette. Bones: No acute osseous abnormality. IMPRESSION: 1. Persistent rounded medial right lower lobe opacity suspicious for mass lesion. Recommend contrast enhanced chest CT for further evaluation. 2. New patchy peripheral right lung opacities. These can also be evaluated by CT. Electronically Signed   By: Darrin Nipper M.D.   On: 08/13/2022 12:05    My personal review of EKG: Rhythm A fib , Rate  94/min, QTc 531 , no Acute ST changes QRS duration 89 ms QT/QTcB 475/531 ms P-R-T axes * -25 31 Atrial fibrillation Borderline left axis deviation Low voltage, precordial leads Anteroseptal infarct, old Minimal ST depression, inferior  Assessment & Plan:    Principal Problem:   Atrial fibrillation with RVR (HCC) Active Problems:   Hyperlipidemia   Essential hypertension   CORONARY ATHEROSCLEROSIS NATIVE CORONARY ARTERY   Iron deficiency anemia   Lung mass   Thyroid nodule   A-fib with RVR, new onset -New onset, heart rate uncontrolled, despite receiving IV metoprolol and being on home metoprolol, currently in the 120s, so he will be admitted to  stepdown and started on Cardizem drip -CHA2DS2-VASc score> 2, will start on heparin GTT, will monitor carefully given history of GI bleed in the past -Resume home dose metoprolol. -Potassium is 3.6, will replete potassium to keep more than 4, will check magnesium and keep more than 2 -will check TSH -will consult cardiology  Acute CHF (unspecified) -With volume overload, worsening lower extremity edema, elevated BNP, will check 2D echo-continue with daily weights, strict ins and out, will start on IV Lasix 40 mg daily(may need to increase if blood pressure is stable on Cardizem, may need to change Cardizem if he has low EF)  Lung mass -Finding of right upper lung mass, suspicious for malignancy, will consult pulmonary, adding further recommendation about biopsy, biopsy likely will be done  as an outpatient given he is currently with acute CHF -Place oncology consult as well to ensure outpatient follow-up  Thyroid mass -will check thyroid US - will check TSH  CAD status post CABG -Hold aspirin given will be started on heparin -Continue with metoprolol and statin  Hypertension -Pressure is soft, will monitor closely, continue with metoprolol mainly for heart rate control -We will hold Imdur and lisinopril to allow more room for diuresis and Cardizem drip  Neuropathy -continue with gabapentin  Prolonged QTc -Continue to monitor on telemetry, replete potassium, avoid prolonging agents  Anemia -Continue with iron supplements  Hyperlipidemia -Continue with statin  GERD - continue with Protonix     DVT Prophylaxis Heparin GTT  AM Labs Ordered, also please review Full Orders  Family Communication: Admission, patients condition and plan of care including tests being ordered have been discussed with the patient  who indicate understanding and agree with the plan and Code Status.  Code Status fULL  Likely DC to  HOME  Condition GUARDED    Consults called:    Pulmonary/oncology/cardiology requested in epic  Admission status: OBSERVATION    Time spent in minutes : 66 MINUTES   Phillips Climes M.D on 08/13/2022 at 3:54 PM   [facility] - Office  [phone]

## 2022-08-13 NOTE — Progress Notes (Signed)
ANTICOAGULATION CONSULT NOTE - Initial Consult  Pharmacy Consult for heparin Indication: atrial fibrillation  Allergies  Allergen Reactions   Lipitor [Atorvastatin] Other (See Comments)    myalgia    Patient Measurements: Height: '6\' 1"'$  (185.4 cm) Weight: 105.2 kg (231 lb 14.8 oz) IBW/kg (Calculated) : 79.9 Heparin Dosing Weight:   Vital Signs: Temp: 97.8 F (36.6 C) (11/09 1445) Temp Source: Oral (11/09 1445) BP: 126/83 (11/09 1451) Pulse Rate: 97 (11/09 1515)  Labs: Recent Labs    08/13/22 1231  HGB 10.7*  HCT 34.9*  PLT 176  CREATININE 1.27*  TROPONINIHS 10    Estimated Creatinine Clearance: 59.1 mL/min (A) (by C-G formula based on SCr of 1.27 mg/dL (H)).   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  Patient is not on anticoagulation prior to admission.    Hgb 10.7  Goal of Therapy:  Heparin level 0.3-0.7 units/ml Monitor platelets by anticoagulation protocol: Yes   Plan:  Give 5000 units bolus x 1 Start heparin infusion at 1400 units/hr Check anti-Xa level in 8 hours and daily while on heparin Continue to monitor H&H and platelets  Margot Ables, PharmD Clinical Pharmacist 08/13/2022 3:57 PM

## 2022-08-14 ENCOUNTER — Observation Stay (HOSPITAL_BASED_OUTPATIENT_CLINIC_OR_DEPARTMENT_OTHER): Payer: Medicare Other

## 2022-08-14 ENCOUNTER — Other Ambulatory Visit (HOSPITAL_COMMUNITY): Payer: Self-pay | Admitting: *Deleted

## 2022-08-14 DIAGNOSIS — I25810 Atherosclerosis of coronary artery bypass graft(s) without angina pectoris: Secondary | ICD-10-CM

## 2022-08-14 DIAGNOSIS — E041 Nontoxic single thyroid nodule: Secondary | ICD-10-CM | POA: Diagnosis not present

## 2022-08-14 DIAGNOSIS — I5021 Acute systolic (congestive) heart failure: Secondary | ICD-10-CM

## 2022-08-14 DIAGNOSIS — I4891 Unspecified atrial fibrillation: Secondary | ICD-10-CM

## 2022-08-14 DIAGNOSIS — R918 Other nonspecific abnormal finding of lung field: Secondary | ICD-10-CM | POA: Diagnosis not present

## 2022-08-14 LAB — CBC
HCT: 31.6 % — ABNORMAL LOW (ref 39.0–52.0)
Hemoglobin: 9.9 g/dL — ABNORMAL LOW (ref 13.0–17.0)
MCH: 29.1 pg (ref 26.0–34.0)
MCHC: 31.3 g/dL (ref 30.0–36.0)
MCV: 92.9 fL (ref 80.0–100.0)
Platelets: 156 10*3/uL (ref 150–400)
RBC: 3.4 MIL/uL — ABNORMAL LOW (ref 4.22–5.81)
RDW: 15.2 % (ref 11.5–15.5)
WBC: 5.4 10*3/uL (ref 4.0–10.5)
nRBC: 0 % (ref 0.0–0.2)

## 2022-08-14 LAB — BASIC METABOLIC PANEL
Anion gap: 9 (ref 5–15)
BUN: 15 mg/dL (ref 8–23)
CO2: 23 mmol/L (ref 22–32)
Calcium: 8.1 mg/dL — ABNORMAL LOW (ref 8.9–10.3)
Chloride: 101 mmol/L (ref 98–111)
Creatinine, Ser: 1.35 mg/dL — ABNORMAL HIGH (ref 0.61–1.24)
GFR, Estimated: 53 mL/min — ABNORMAL LOW (ref >60)
Glucose, Bld: 118 mg/dL — ABNORMAL HIGH (ref 70–99)
Potassium: 4.2 mmol/L (ref 3.5–5.1)
Sodium: 133 mmol/L — ABNORMAL LOW (ref 135–145)

## 2022-08-14 LAB — ECHOCARDIOGRAM COMPLETE
AR max vel: 1.1 cm2
AV Area VTI: 1.18 cm2
AV Area mean vel: 1.19 cm2
AV Mean grad: 6.5 mmHg
AV Peak grad: 12.7 mmHg
Ao pk vel: 1.78 m/s
Area-P 1/2: 5.32 cm2
Height: 73 in
MV M vel: 4.44 m/s
MV Peak grad: 78.9 mmHg
S' Lateral: 3.2 cm
Weight: 3273.39 oz

## 2022-08-14 LAB — HEPARIN LEVEL (UNFRACTIONATED)
Heparin Unfractionated: 0.12 IU/mL — ABNORMAL LOW (ref 0.30–0.70)
Heparin Unfractionated: <0.1 IU/mL — ABNORMAL LOW (ref 0.30–0.70)

## 2022-08-14 MED ORDER — HEPARIN BOLUS VIA INFUSION
3000.0000 [IU] | Freq: Once | INTRAVENOUS | Status: AC
  Administered 2022-08-14: 3000 [IU] via INTRAVENOUS
  Filled 2022-08-14: qty 3000

## 2022-08-14 MED ORDER — APIXABAN 5 MG PO TABS
5.0000 mg | ORAL_TABLET | Freq: Two times a day (BID) | ORAL | Status: DC
  Administered 2022-08-14 – 2022-08-16 (×5): 5 mg via ORAL
  Filled 2022-08-14 (×5): qty 1

## 2022-08-14 MED ORDER — DILTIAZEM HCL 30 MG PO TABS
30.0000 mg | ORAL_TABLET | Freq: Four times a day (QID) | ORAL | Status: DC
  Administered 2022-08-14: 30 mg via ORAL
  Filled 2022-08-14 (×2): qty 1

## 2022-08-14 MED ORDER — LOPERAMIDE HCL 2 MG PO CAPS
2.0000 mg | ORAL_CAPSULE | ORAL | Status: DC | PRN
  Administered 2022-08-14 (×2): 2 mg via ORAL
  Filled 2022-08-14 (×3): qty 1

## 2022-08-14 MED ORDER — HEPARIN BOLUS VIA INFUSION
3000.0000 [IU] | Freq: Once | INTRAVENOUS | Status: DC
  Filled 2022-08-14: qty 3000

## 2022-08-14 MED ORDER — PANTOPRAZOLE SODIUM 40 MG PO TBEC
40.0000 mg | DELAYED_RELEASE_TABLET | Freq: Two times a day (BID) | ORAL | Status: DC
  Administered 2022-08-14 – 2022-08-16 (×4): 40 mg via ORAL
  Filled 2022-08-14 (×4): qty 1

## 2022-08-14 MED ORDER — PERFLUTREN LIPID MICROSPHERE
1.0000 mL | INTRAVENOUS | Status: AC | PRN
  Administered 2022-08-14: 3 mL via INTRAVENOUS

## 2022-08-14 MED ORDER — CHLORHEXIDINE GLUCONATE CLOTH 2 % EX PADS
6.0000 | MEDICATED_PAD | Freq: Every day | CUTANEOUS | Status: DC
  Administered 2022-08-14 – 2022-08-16 (×3): 6 via TOPICAL

## 2022-08-14 MED ORDER — METOPROLOL TARTRATE 25 MG PO TABS
25.0000 mg | ORAL_TABLET | Freq: Four times a day (QID) | ORAL | Status: DC
  Administered 2022-08-14: 25 mg via ORAL
  Filled 2022-08-14 (×2): qty 1

## 2022-08-14 NOTE — Consult Note (Signed)
Cardiology Consultation   Patient ID: Rodney Barnett MRN: [id number]; DOB: [birth year]  Admit date: 08/13/2022 Date of Consult: 08/14/2022  PCP:  [name], MD   [place] Providers Cardiologist:  Rozann Lesches, MD        Patient Profile:   Rodney Barnett is a 81 y.o. male with a hx of CAD (s/p CABG in 1994, cath in 2010 showing occluded SVG-RCA and occluded SVG-D1 with patent LIMA-LAD), HTN, HLD, aortic stenosis, history of bladder cancer and anemia who is being seen 08/14/2022 for the evaluation of atrial fibrillation with RVR at the request of Dr. Waldron Labs.  History of Present Illness:   Rodney Barnett was last examined by the Cardiology service during an admission in 08/2021 for worsening dyspnea which was felt to be secondary to anemia. Troponin values were flat at that time and was recommended to just resume ASA once cleared from a GI perspective to do so (had previously been on Plavix). He was found to have a single nonbleeding AVM which was treated with APC and clipping. Was also noted to have diverticulosis and nonbleeding internal hemorrhoids. He did have follow-up scheduled with Dr. Domenic Polite the following month but canceled the visit and has not been evaluated by Cardiology since.  He presented to [facility] ED on 08/13/2022 for evaluation of worsening dyspnea and weakness for the past 3 days. Initial labs showed WBC 5.6, Hgb 10.7 (previously at 12.6 in 02/2022), platelets 176, Na+ 133, K+ 3.6 and creatinine 1.27 (close to baseline). Magnesium 1.9. TSH normal at 2.607. BNP 550. Initial and repeat troponin values negative at 10. CXR showed a persistent rounded medial right lower lobe opacity suspicious for mass and was noted to have new patchy peripheral right lung opacities. 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 EKG showed rate controlled atrial fibrillation, heart rate 94 with no acute ST changes.  He was started on IV Cardizem along with IV Heparin at the time of admission (received IV Lopressor 5 mg in the ED with minimal response by review of notes). He has been continued on PTA Lopressor 25 mg twice daily. Was also started on Lasix 40 mg daily for diuresis. I's and O's have not been recorded thus far. Repeat labs this AM show his creatinine has slightly increased to 1.35 and sodium remains stable at 133.  In talking with the patient today, he reports his main symptom at the time of admission was worsening coughing over the past 3 to 4 days. Says he has noticed more shortness of breath as well along with orthopnea. Denies any specific abdominal distention or lower extremity edema. No recent chest pain or palpitations. He does have a smart watch but does not typically check his heart rate routinely.  Says he lives by himself and performs ADLs independently. Denies any recent symptoms when doing this. No recent melena, hematochezia or hematuria.  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   CHF (congestive heart failure) (HCC)    Chronic back pain    Chronic rhinitis    Coronary atherosclerosis of native coronary artery    Multivessel, LVEF 55-60%, basal inferior hypokinesis, known graft disease   Diverticulosis    Erectile dysfunction    Essential hypertension  History of bladder cancer    Insomnia    Internal hemorrhoids    Lipoma    Mixed hyperlipidemia    Peripheral neuropathy    Tubular adenoma of colon     Past Surgical History:  Procedure Laterality Date   COLONOSCOPY WITH PROPOFOL N/A 09/01/2021   Procedure: COLONOSCOPY WITH PROPOFOL;  Surgeon: Lavena Bullion, DO;  Location: [facility];  Service: Gastroenterology;  Laterality: N/A;   CORONARY ARTERY BYPASS GRAFT  1994   LIMA to  LAD, SVG to RCA, SVG to diagonal   ENDOSCOPIC MUCOSAL RESECTION  09/01/2021   Procedure: ENDOSCOPIC MUCOSAL RESECTION;  Surgeon: Lavena Bullion, DO;  Location: [facility];  Service: Gastroenterology;;   ESOPHAGOGASTRODUODENOSCOPY (EGD) WITH PROPOFOL N/A 09/01/2021   Procedure: ESOPHAGOGASTRODUODENOSCOPY (EGD) WITH PROPOFOL;  Surgeon: Lavena Bullion, DO;  Location: [place];  Service: Gastroenterology;  Laterality: N/A;   HEMOSTASIS CLIP PLACEMENT  09/01/2021   Procedure: HEMOSTASIS CLIP PLACEMENT;  Surgeon: Lavena Bullion, DO;  Location: [place];  Service: Gastroenterology;;   HOT HEMOSTASIS N/A 09/01/2021   Procedure: HOT HEMOSTASIS (ARGON PLASMA COAGULATION/BICAP);  Surgeon: Lavena Bullion, DO;  Location: [facility]OSCOPY;  Service: Gastroenterology;  Laterality: N/A;   POLYPECTOMY  09/01/2021   Procedure: POLYPECTOMY;  Surgeon: Lavena Bullion, DO;  Location: [facility];  Service: Gastroenterology;;   SUBMUCOSAL LIFTING INJECTION  09/01/2021   Procedure: SUBMUCOSAL LIFTING INJECTION;  Surgeon: Lavena Bullion, DO;  Location: [place] [facility]OS[facility];  Service: Gastroenterology;;   TONSILLECTOMY     TRANSURETHRAL RESECTION OF BLADDER TUMOR N/A 09/09/2018   Procedure: TRANSURETHRAL RESECTION OF BLADDER TUMOR (TURBT) WITH INSTILLATION OF POST OPERATIVE CHEMOTHERAPY;  Surgeon: Kathie Rhodes, MD;  Location: [facility];  Service: Urology;  Laterality: N/A;     Home Medications:  Prior to Admission medications   Medication Sig Start Date End Date Taking? Authorizing Provider  aspirin 81 MG tablet Take 81 mg by mouth daily.   Yes [provider]  gabapentin (NEURONTIN) 300 MG capsule TAKE (2) CAPSULES BY MOUTH THREE TIMES DAILY. 07/22/22  Yes Burchette, Alinda Sierras, MD  Iron, Ferrous Sulfate, 325 (65 Fe) MG TABS Take 1 tablet by mouth daily. 10/17/21  Yes Thornton Park, MD  isosorbide mononitrate (IMDUR) 30 MG 24 hr tablet TAKE 1 TABLET ONCE DAILY. 06/23/22  Yes Burchette,  Alinda Sierras, MD  lisinopril (ZESTRIL) 10 MG tablet Take 1 tablet (10 mg total) by mouth daily. 06/11/22  Yes Burchette, Alinda Sierras, MD  metoprolol tartrate (LOPRESSOR) 25 MG tablet TAKE (1) TABLET TWICE DAILY. 06/22/22  Yes Burchette, Alinda Sierras, MD  nitroGLYCERIN (NITROSTAT) 0.4 MG SL tablet Place 1 tablet (0.4 mg total) under the tongue every 5 (five) minutes x 3 doses as needed for chest pain (If no relief after 3rd dose, proceed to the ED for an evaluation or call 911). 08/20/21  Yes Burchette, Alinda Sierras, MD  Omega-3 Fatty Acids (FISH OIL) 1000 MG CPDR Take 1,000 mg by mouth daily.   Yes [provider]  pantoprazole (PROTONIX) 40 MG tablet TAKE 1 TABLET ONCE DAILY. 06/22/22  Yes Thornton Park, MD  rosuvastatin (CRESTOR) 20 MG tablet Take 1 tablet (20 mg total) by mouth daily. 06/11/22  Yes Burchette, Alinda Sierras, MD  HYDROcodone-acetaminophen (NORCO) 10-325 MG tablet TAKE (1) TABLET BY MOUTH EVERY SIX HOURS AS NEEDED Patient not taking: Reported on 08/13/2022 05/19/22   [name], MD    Inpatient Medications: Scheduled Meds:  Chlorhexidine Gluconate Cloth  6 each Topical Daily  diltiazem  30 mg Oral Q6H   ferrous sulfate  325 mg Oral Daily   furosemide  40 mg Intravenous Daily   gabapentin  300 mg Oral TID   metoprolol tartrate  25 mg Oral BID   omega-3 acid ethyl esters  1 g Oral BID   pantoprazole  40 mg Oral Daily   rosuvastatin  20 mg Oral Daily   Continuous Infusions:  heparin 1,700 Units/hr (08/14/22 0534)   PRN Meds: acetaminophen **OR** acetaminophen, albuterol, nitroGLYCERIN  Allergies:    Allergies  Allergen Reactions   Lipitor [Atorvastatin] Other (See Comments)    myalgia    Social History:   Social History   Socioeconomic History   Marital status: Widowed    Spouse name: Not on file   Number of children: Not on file   Years of education: Not on file   Highest education level: Not on file  Occupational History   Not on file  Tobacco Use   Smoking  status: Former    Packs/day: 3.00    Years: 40.00    Total pack years: 120.00    Types: Cigarettes    Start date: 10/05/1954    Quit date: 01/06/1993    Years since quitting: 29.6   Smokeless tobacco: Never  Vaping Use   Vaping Use: Never used  Substance and Sexual Activity   Alcohol use: No    Alcohol/week: 0.0 standard drinks of alcohol   Drug use: No   Sexual activity: Not on file  Other Topics Concern   Not on file  Social History Narrative   Not on file   Social Determinants of Health   Financial Resource Strain: Low Risk  (06/27/2021)   Overall Financial Resource Strain (CARDIA)    Difficulty of Paying Living Expenses: Not hard at all  Food Insecurity: No Food Insecurity (08/13/2022)   Hunger Vital Sign    Worried About Running Out of Food in the Last Year: Never true    Ran Out of Food in the Last Year: Never true  Transportation Needs: No Transportation Needs (08/13/2022)   PRAPARE - Hydrologist (Medical): No    Lack of Transportation (Non-Medical): No  Physical Activity: Insufficiently Active (06/27/2021)   Exercise Vital Sign    Days of Exercise per Week: 3 days    Minutes of Exercise per Session: 30 min  Stress: No Stress Concern Present (06/27/2021)   [place]    Feeling of Stress : Not at all  Social Connections: Socially Isolated (06/27/2021)   Social Connection and Isolation Panel [NHANES]    Frequency of Communication with Friends and Family: Three times a week    Frequency of Social Gatherings with Friends and Family: Three times a week    Attends Religious Services: Never    Active Member of Clubs or Organizations: No    Attends Archivist Meetings: Never    Marital Status: Widowed  Intimate Partner Violence: Not At Risk (08/13/2022)   Humiliation, Afraid, Rape, and Kick questionnaire    Fear of Current or Ex-Partner: No    Emotionally Abused: No     Physically Abused: No    Sexually Abused: No    Family History:    Family History  Problem Relation Age of Onset   Hypertension Other    Cancer Brother        renal cancer     ROS:  Please see the  history of present illness.   All other ROS reviewed and negative.     Physical Exam/Data:   Vitals:   08/14/22 0400 08/14/22 0500 08/14/22 0600 08/14/22 0800  BP:  102/69 (!) 102/37 116/66  Pulse:  71 79 92  Resp:  19 18 (!) 22  Temp: 98 F (36.7 C)     TempSrc:      SpO2:  94% 95% 94%  Weight:  92.8 kg    Height:       No intake or output data in the 24 hours ending 08/14/22 0842    08/14/2022    5:00 AM 08/13/2022    6:27 PM 08/13/2022   11:01 AM  Last 3 Weights  Weight (lbs) 204 lb 9.4 oz 202 lb 13.2 oz 231 lb 14.8 oz  Weight (kg) 92.8 kg 92 kg 105.2 kg     Body mass index is 26.99 kg/m.  General: Pleasant male appearing in no acute distress HEENT: normal Neck: no JVD Vascular: No carotid bruits; Distal pulses 2+ bilaterally Cardiac:  normal S1, S2; Irregularly irregular. 2/6 SEM along RUSB.  Lungs: expiratory wheezing along upper lung fields and rales bilaterally.  Abd: soft, nontender, no hepatomegaly  Ext: no pitting edema Musculoskeletal:  No deformities, BUE and BLE strength normal and equal Skin: warm and dry  Neuro:  CNs 2-12 intact, no focal abnormalities noted Psych:  Normal affect   EKG:  The EKG was personally reviewed and demonstrates: rate-controlled atrial fibrillation, heart rate 94 with no acute ST changes.  Telemetry:  Telemetry was personally reviewed and demonstrates: Atrial fibrillation, HR in 80's to low-100's. Occasional PVC's.   Relevant CV Studies:  Echocardiogram: 08/2021 IMPRESSIONS     1. Left ventricular ejection fraction, by estimation, is 60 to 65%. The  left ventricle has normal function. The left ventricle has no regional  wall motion abnormalities. Left ventricular diastolic parameters were  normal.   2. Right  ventricular systolic function is mildly reduced. The right  ventricular size is mildly enlarged.   3. Left atrial size was moderately dilated.   4. The mitral valve is abnormal. Mild mitral valve regurgitation. No  evidence of mitral stenosis.   5. Tricuspid valve regurgitation is moderate.   6. The aortic valve is tricuspid. There is moderate calcification of the  aortic valve. There is moderate thickening of the aortic valve. Aortic  valve regurgitation is not visualized. Mild aortic valve stenosis.   7. The inferior vena cava is normal in size with greater than 50%  respiratory variability, suggesting right atrial pressure of 3 mmHg.    Laboratory Data:  High Sensitivity Troponin:   Recent Labs  Lab 08/13/22 1231 08/13/22 1512  TROPONINIHS 10 10     Chemistry Recent Labs  Lab 08/13/22 1231 08/14/22 0023  NA 133* 133*  K 3.6 4.2  CL 100 101  CO2 26 23  GLUCOSE 124* 118*  BUN 15 15  CREATININE 1.27* 1.35*  CALCIUM 8.1* 8.1*  MG 1.9  --   GFRNONAA 57* 53*  ANIONGAP 7 9    No results for input(s): "PROT", "ALBUMIN", "AST", "ALT", "ALKPHOS", "BILITOT" in the last 168 hours. Lipids No results for input(s): "CHOL", "TRIG", "HDL", "LABVLDL", "LDLCALC", "CHOLHDL" in the last 168 hours.  Hematology Recent Labs  Lab 08/13/22 1231 08/14/22 0023  WBC 5.6 5.4  RBC 3.68* 3.40*  HGB 10.7* 9.9*  HCT 34.9* 31.6*  MCV 94.8 92.9  MCH 29.1 29.1  MCHC 30.7 31.3  RDW  15.1 15.2  PLT 176 156   Thyroid  Recent Labs  Lab 08/13/22 1231  TSH 2.607    BNP Recent Labs  Lab 08/13/22 1231  BNP 550.0*    DDimer No results for input(s): "DDIMER" in the last 168 hours.   Radiology/Studies:  US THYROID  Result Date: 08/13/2022 CLINICAL DATA:  Incidental on CT. Left-sided thyroid nodule demonstrated on preceding chest CT EXAM: THYROID ULTRASOUND TECHNIQUE: Ultrasound examination of the thyroid gland and adjacent soft tissues was performed. COMPARISON:  Chest CT-earlier same day  FINDINGS: Parenchymal Echotexture: Mildly heterogenous Isthmus: Normal in size measuring 0.4 cm in diameter Right lobe: Normal in size measuring 5.4 x 2.7 x 2.0 cm Left lobe: Normal in size measuring 5.7 x 2.5 x 2.5 cm _________________________________________________________ Estimated total number of nodules >/= 1 cm: 1 Number of spongiform nodules >/=  2 cm not described below (TR1): 0 Number of mixed cystic and solid nodules >/= 1.5 cm not described below (TR2): 0 _________________________________________________________ There is a punctate (approximate 0.8 cm) anechoic cyst within the superior pole of the right lobe of the thyroid (labeled 1) which contains an internal echogenic foci ring artifact compatible with benign colloid. This benign colloid containing cyst does not meet criteria to recommend percutaneous sampling or continued dedicated follow-up. _________________________________________________________ Nodule # 2: Location: Left; Inferior correlates with the nodule questioned on preceding chest CT Maximum size: 2.9 cm; Other 2 dimensions: 2.6 x 2.5 cm Composition: solid/almost completely solid (2) Echogenicity: hypoechoic (2) Shape: not taller-than-wide (0) Margins: ill-defined (0) Echogenic foci: none (0) ACR TI-RADS total points: 4. ACR TI-RADS risk category: TR4 (4-6 points). ACR TI-RADS recommendations: **Given size (>/= 1.5 cm) and appearance, fine needle aspiration of this moderately suspicious nodule should be considered based on TI-RADS criteria. 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 The above is in keeping with the ACR TI-RADS  recommendations - J Am Coll Radiol 2017;14:587-595. Electronically Signed   By: Sandi Mariscal M.D.   On: 08/13/2022 17:38   CT Chest Wo Contrast  Result Date: 08/13/2022 CLINICAL DATA:  Abnormal chest radiograph with opacity in the medial RIGHT lower lobe EXAM: CT CHEST WITHOUT CONTRAST TECHNIQUE: Multidetector CT imaging of the chest was performed following the standard protocol without IV contrast. RADIATION DOSE REDUCTION: This exam was performed according to the departmental dose-optimization program which includes automated exposure control, adjustment of the mA and/or kV according to patient size and/or use of iterative reconstruction technique. COMPARISON:  Chest radiograph 08/13/2022 FINDINGS: Cardiovascular: Atherosclerotic calcifications aortic arch, coronary arteries, and proximal great vessels. Post CABG. Aorta normal caliber. Heart size normal. No pericardial effusion. Mediastinum/Nodes: Question mass extending from inferior aspect of LEFT thyroid lobe, 2.8 x 1.4 cm image 26. Enlarged RIGHT paratracheal lymph node 11 mm image 51. Additional subcarinal nodal enlargement 14 mm image 83. Esophagus unremarkable. Lungs/Pleura: Peribronchial thickening. Interseptal thickening upper lungs with scattered subpleural interstitial thickening bilaterally. 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 Mass is confluence with RIGHT hilar adenopathy. Obstruction of the RIGHT lower lobe bronchus. Some infiltrate peripheral to mass. Calcified granuloma anterior RIGHT upper lobe. No additional masses. No pleural effusion. Upper Abdomen: Cholelithiasis. Splenule at splenic hilum. Low-attenuation lesion LEFT lobe  liver anteriorly 14 mm image 140 consistent with cyst. Musculoskeletal: Post median sternotomy.  No acute osseous findings. 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 Mass is confluent with RIGHT hilar adenopathy with additional enlarged mediastinal nodes. Obstruction of the RIGHT lower lobe bronchus with peripheral infiltrate. 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 Cholelithiasis. Scattered atherosclerotic disease changes including coronary arteries post CABG. Suspected LEFT inferior thyroid mass 2.8 x 1.4 cm; recommend nonemergent thyroid ultrasound follow-up. Aortic Atherosclerosis (ICD10-I70.0).  Emphysema (ICD10-J43.9). Findings called to Dr. Langston Masker on 08/13/2022 at 1320 hours. Electronically Signed   By: Lavonia Dana M.D.   On: 08/13/2022 13:22   DG Chest 2 View  Result Date: 08/13/2022 CLINICAL DATA:  Cough and shortness of breath EXAM: CHEST - 2 VIEW COMPARISON:  Chest radiograph dated 04/03/2022 FINDINGS: Lines/tubes: Median sternotomy wires are nondisplaced. Fracture of the inferior most sternotomy wire. Endoscopy clip projects over the stomach. Chest: Persistent rounded medial right lower lobe opacity. New patchy peripheral right lung opacities. Left lingular linear atelectasis/scarring. Pleura: No pneumothorax or pleural effusion. Heart/mediastinum: Similar postsurgical cardiomediastinal silhouette. Bones: No acute osseous abnormality. IMPRESSION: 1. Persistent rounded medial right lower lobe opacity suspicious for mass lesion. Recommend contrast enhanced chest CT for further evaluation. 2. New patchy peripheral right lung opacities. These can also be evaluated by CT. Electronically Signed   By: Darrin Nipper M.D.   On: 08/13/2022 12:05     Assessment and Plan:   1. Atrial Fibrillation with RVR - Found to be in atrial fibrillation with RVR at the time of admission and it is unknown how long he has been in the arrhythmia. He denies any associated chest pain or palpitations. - Electrolytes within a normal range and TSH normal. He did have a nodule by thyroid ultrasound.  Also found to have a right lower lobe mass  concerning for neoplasm as outlined above. - He is currently on IV Cardizem at 5 mg/hr and was continued on PTA Lopressor 25 mg twice daily.  Will stop IV Cardizem and switch to short-acting PO Cardizem 30 mg every 6 hours. Pending his BP trend, would like to consolidate to a single agent. Echocardiogram is also pending and this will help to determine if Cardizem or Lopressor would be the best option for him going forward. - His CHA2DS2-VASc Score is at least 4. He is currently on Heparin. Pending Pulmonology/Oncology evaluation, would recommend switching to Eliquis once determined no further procedures or testing this admission. Would not pursue a TEE/DCCV this admision as rates are overall well-controlled in the 80's to 90's and he would need to be on uninterrupted anticoagulation for at least a month going forward which could delay further work-up of his lung mass. Anticipate a rate-control strategy at this time.   2. CAD  - He is s/p CABG in 1994 with cath in 2010 showing occluded SVG-RCA and occluded SVG-D1 with patent LIMA-LAD. - He denies any recent anginal symptoms and Hs Troponin values have been negative. Repeat echocardiogram pending for reassessment of EF and wall motion. - Remains on Lopressor 25 mg twice daily and Crestor 20 mg daily. Will stop ASA 81 mg daily given the need for anticoagulation. Can restart Imdur pending BP trend.   3. Acute CHF exacerbation (Echo pending to determine HFpEF or HFrEF) - BNP elevated to 550 on admission and suspect this is secondary to his atrial fibrillation with RVR.  He has  been started on IV Lasix 40 mg daily and reports good urination with this. Would follow I's and O's along with daily weights.  4. Aortic Stenosis -This was mild by echocardiogram in 08/2021. Repeat imaging pending.  5. HTN - BP has been variable at 91/52 -148/115 since admission, at 116/66 on most recent check.  Continue Lopressor and Cardizem as discussed above. Would hold PTA  Lisinopril 10 mg daily and Imdur 30 mg daily for now to allow for titration of AV nodal blocking agents.  6. Stage 3 CKD - Baseline creatinine 1.3 - 1.4. At 1.27 on admission and at 1.35 today. Continue to follow with diuresis.   7. Anemia - His Hgb was at 10.7 on admission, at 9.9 today. No reports of active bleeding. Continue to follow.   8. 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  - Pulmonology and Oncology consults are pending but at this time he is favoring conservative management but plans to review further with family.    Risk Assessment/Risk Scores:    CHA2DS2-VASc Score = 4   This indicates a 4.8% annual risk of stroke. The patient's score is based upon: CHF History: 0 HTN History: 1 Diabetes History: 0 Stroke History: 0 Vascular Disease History: 1 Age Score: 2 Gender Score: 0    For questions or updates, please contact [place] Please consult [email or web address] for contact info under    Signed, Erma Heritage, PA-C  08/14/2022 8:42 AM

## 2022-08-14 NOTE — Progress Notes (Signed)
ANTICOAGULATION CONSULT NOTE - Follow Up Consult  Pharmacy Consult for heparin Indication: atrial fibrillation  Labs: Recent Labs    08/13/22 1231 08/13/22 1512 08/14/22 0023  HGB 10.7*  --  9.9*  HCT 34.9*  --  31.6*  PLT 176  --  156  HEPARINUNFRC  --   --  0.12*  CREATININE 1.27*  --  1.35*  TROPONINIHS 10 10  --     Assessment: 81yo male subtherapeutic on heparin with initial dosing for new Afib; no infusion issues or signs of bleeding per RN.  Goal of Therapy:  Heparin level 0.3-0.7 units/ml   Plan:  Will rebolus with heparin 3000 units and increase heparin infusion by 3 units/kg/hr to 1700 units/hr and check level in 8 hours.    Wynona Neat, PharmD, BCPS  08/14/2022,2:45 AM

## 2022-08-14 NOTE — Hospital Course (Signed)
81 y.o. male, with past medical history of CAD status post CABG 1994,bladder cance, history of GI bleed in November 2022 due to AVM status post cautery , s/p TURBT and chemo in 2019, CKD-3A, chronic back pain, HTN and HLD . -Patient presents to ED today secondary to shortness of breath, reports progressive over the last few days, he does report orthopnea and exertional dyspnea as well, he does report persistent coughing, with some productive phlegm, he denies any chest pain, fever or chills, he reports lower extremity edema, usually worse at the end of the day upon prolonged standing, and improved with leg elevation and rest, he denies any chest pain, blood in stool or urine. -In ED he was noted to be in A-fib with RVR, new onset, he was dyspneic but with no oxygen requirement, CT chest significant for mass (new ), and thyroid mass, as well creatinine at 1.27, at baseline, hemoglobin 10.7, at baseline, [facility] hospitalist consulted to admit

## 2022-08-14 NOTE — TOC Initial Note (Signed)
Transition of Care [facility]) - Initial/Assessment Note    Patient Details  Name: Rodney Barnett MRN: [id number] Date of Birth: 1940-12-15  Transition of Care [facility]) CM/SW Contact:    Iona Beard, [place] Phone Number: 08/14/2022, 2:18 PM  Clinical Narrative:                 [facility] consulted for needs. CSW met with pt in room to complete assessment. Pt states that he lives alone. Pt is independent in completing ADLs and provides his own transportation. Pt has not had HH and pt uses a cane to ambulate. CSW inquired if pt is interested in [facility] at D/C. Pt states he is not at this time. TOC to follow.  Expected Discharge Plan: Home/Self Care Barriers to Discharge: No Barriers Identified   Patient Goals and CMS Choice Patient states their goals for this hospitalization and ongoing recovery are:: return home CMS Medicare.gov Compare Post Acute Care list provided to:: Patient Choice offered to / list presented to : Patient  Expected Discharge Plan and Services Expected Discharge Plan: Home/Self Care In-house Referral: Clinical Social Work Discharge Planning Services: CM Consult   Living arrangements for the past 2 months: Single Family Home                                      Prior Living Arrangements/Services Living arrangements for the past 2 months: Single Family Home Lives with:: Self Patient language and need for interpreter reviewed:: Yes Do you feel safe going back to the place where you live?: Yes      Need for Family Participation in Patient Care: Yes (Comment) Care giver support system in place?: Yes (comment) Current home services: DME Kasandra Knudsen) Criminal Activity/Legal Involvement Pertinent to Current Situation/Hospitalization: No - Comment as needed  Activities of Daily Living Home Assistive Devices/Equipment: None ADL Screening (condition at time of admission) Patient's cognitive ability adequate to safely complete daily activities?: No Is the patient deaf or have  difficulty hearing?: No Does the patient have difficulty seeing, even when wearing glasses/contacts?: No Does the patient have difficulty concentrating, remembering, or making decisions?: No Patient able to express need for assistance with ADLs?: No Does the patient have difficulty dressing or bathing?: No Independently performs ADLs?: Yes (appropriate for developmental age) Does the patient have difficulty walking or climbing stairs?: No Weakness of Legs: Both Weakness of Arms/Hands: None  Permission Sought/Granted                  Emotional Assessment Appearance:: Appears stated age Attitude/Demeanor/Rapport: Engaged Affect (typically observed): Accepting Orientation: : Oriented to Self, Oriented to Place, Oriented to  Time, Oriented to Situation Alcohol / Substance Use: Not Applicable Psych Involvement: No (comment)  Admission diagnosis:  Shortness of breath [R06.02] Atrial fibrillation with RVR (HCC) [I48.91] Pulmonary mass [R91.8] Patient Active Problem List   Diagnosis Date Noted   Acute systolic heart failure ([place]) 08/14/2022   Coronary artery disease involving coronary bypass graft of native heart without angina pectoris 08/14/2022   Atrial fibrillation with RVR ([place]) 08/13/2022   Lung mass 08/13/2022   Thyroid nodule 08/13/2022   Iron deficiency anemia    Diverticulosis of colon without hemorrhage    Grade II internal hemorrhoids    Cecal polyp    Adenomatous polyp of descending colon    GI bleeding 08/28/2021   Acquired thrombophilia ([place]) 12/26/2019   Spinal stenosis of lumbar  region 12/26/2019   Bladder cancer ([place]) 11/21/2018   Chronic back pain 11/16/2013   Obesity (BMI 30-39.9) 06/07/2013   Peripheral neuropathy 06/07/2012   DYSURIA 05/14/2010   INSOMNIA 04/24/2010   LIPOMA 11/15/2009   RHINITIS 11/15/2009   CORONARY ATHEROSCLEROSIS NATIVE CORONARY ARTERY 10/07/2009   Hyperlipidemia 12/27/2008   ERECTILE DYSFUNCTION 12/27/2008   Essential  hypertension 12/27/2008   PCP:  [name], MD Pharmacy:   [facility], [address] [place] [address] Phone: [phone] Fax: [phone]     Social Determinants of Health (SDOH) Interventions    Readmission Risk Interventions     No data to display

## 2022-08-14 NOTE — Progress Notes (Signed)
PROGRESS NOTE   Rodney Barnett  ASN:[id number] DOB: 1941/01/21 DOA: 08/13/2022 PCP: [name], MD   Chief Complaint  Patient presents with   Shortness of Breath   Level of care: Stepdown  Brief Admission History:  81 y.o. male, with past medical history of CAD status post CABG 1994,bladder cance, history of GI bleed in November 2022 due to AVM status post cautery , s/p TURBT and chemo in 2019, CKD-3A, chronic back pain, HTN and HLD . -Patient presents to ED today secondary to shortness of breath, reports progressive over the last few days, he does report orthopnea and exertional dyspnea as well, he does report persistent coughing, with some productive phlegm, he denies any chest pain, fever or chills, he reports lower extremity edema, usually worse at the end of the day upon prolonged standing, and improved with leg elevation and rest, he denies any chest pain, blood in stool or urine. -In ED he was noted to be in A-fib with RVR, new onset, he was dyspneic but with no oxygen requirement, CT chest significant for mass (new ), and thyroid mass, as well creatinine at 1.27, at baseline, hemoglobin 10.7, at baseline, [facility] hospitalist consulted to admit   Assessment and Plan: A-fib with RVR, new onset -New onset, heart rate uncontrolled, despite receiving IV metoprolol and being on home metoprolol, currently in the 120s, so he will be admitted to stepdown and started on Cardizem drip -CHA2DS2-VASc score> 2, will start on heparin GTT, will monitor carefully given history of GI bleed in the past-->when appropriate plan to transition to apixaban -Resume home dose metoprolol. -Potassium repleted -will check TSH-->2.6 -cardiology consulted and recommending switch to oral meds   Acute CHF (unspecified) -With volume overload, worsening lower extremity edema, elevated BNP, will check 2D echo-continue with daily weights, strict ins and out, will start on IV Lasix 40 mg daily(may need to increase  if blood pressure is stable on Cardizem, may need to change Cardizem if he has low EF)   Lung mass Right Lung -Finding of right upper lung mass, suspicious for malignancy, will consult pulmonary, adding further recommendation about biopsy, biopsy likely will be done as an outpatient given he is currently with acute CHF -pulmonary consult requested - likely could have outpatient bronch/biopsy -outpatient oncology follow up -pt unsure he wants to pursue treatment, palliative consult not available for next 3 days at this hospital    Thyroid mass - suspicious -will check thyroid US-->will need percutaneous biopsy when able  - will check TSH-->2.607   CAD status post CABG -Hold aspirin given will be started on heparin -Continue with metoprolol and statin   Hypertension -Pressure is soft, will monitor closely, continue with metoprolol mainly for heart rate control -We will hold Imdur and lisinopril to allow more room for diuresis and Cardizem drip   Neuropathy -continue with gabapentin   Prolonged QTc -Continue to monitor on telemetry, replete potassium, avoid prolonging agents   Anemia -Continue with iron supplements   Hyperlipidemia -Continue with statin   GERD - continue with Protonix  DVT prophylaxis: IV heparin Code Status: Full  Family Communication:    Consultants:  Cardiology   Procedures:   Antimicrobials:    Subjective: Pt is reporting no CP, no SOB, but some palpitations.  Objective: Vitals:   08/14/22 0700 08/14/22 0800 08/14/22 0900 08/14/22 1000  BP:  116/66 (!) 106/52 111/76  Pulse: (!) 51 92 98 (!) 113  Resp: (!) 23 (!) 22 16 (!) 28  Temp:  TempSrc:      SpO2: 93% 94% 95% 93%  Weight:      Height:        Intake/Output Summary (Last 24 hours) at 08/14/2022 1231 Last data filed at 08/14/2022 1100 Gross per 24 hour  Intake --  Output 700 ml  Net -700 ml   Filed Weights   08/13/22 1101 08/13/22 1827 08/14/22 0500  Weight: 105.2 kg 92 kg  92.8 kg   Examination:  General exam: Appears calm and comfortable, elderly male, awake. Respiratory system: Clear to auscultation. Respiratory effort normal. Cardiovascular system: irregularly irregular, normal S1 & S2 heard. No JVD, murmurs, rubs, gallops or clicks. No pedal edema. Gastrointestinal system: Abdomen is nondistended, soft and nontender. No organomegaly or masses felt. Normal bowel sounds heard. Central nervous system: Alert and oriented. No focal neurological deficits. Extremities: 1+ edema BLEs, Symmetric 5 x 5 power. Skin: No rashes, lesions or ulcers. Psychiatry: Judgement and insight appear normal. Mood & affect appropriate.   Data Reviewed: I have personally reviewed following labs and imaging studies  CBC: Recent Labs  Lab 08/13/22 1231 08/14/22 0023  WBC 5.6 5.4  NEUTROABS 4.5  --   HGB 10.7* 9.9*  HCT 34.9* 31.6*  MCV 94.8 92.9  PLT 176 270    Basic Metabolic Panel: Recent Labs  Lab 08/13/22 1231 08/14/22 0023  NA 133* 133*  K 3.6 4.2  CL 100 101  CO2 26 23  GLUCOSE 124* 118*  BUN 15 15  CREATININE 1.27* 1.35*  CALCIUM 8.1* 8.1*  MG 1.9  --     CBG: No results for input(s): "GLUCAP" in the last 168 hours.  Recent Results (from the past 240 hour(s))  Resp Panel by RT-PCR (Flu A&B, Covid) Anterior Nasal Swab     Status: None   Collection Time: 08/13/22 11:24 AM   Specimen: Anterior Nasal Swab  Result Value Ref Range Status   SARS Coronavirus 2 by RT PCR NEGATIVE NEGATIVE Final    Comment: (NOTE) SARS-CoV-2 target nucleic acids are NOT DETECTED.  The SARS-CoV-2 RNA is generally detectable in upper respiratory specimens during the acute phase of infection. The lowest concentration of SARS-CoV-2 viral copies this assay can detect is 138 copies/mL. A negative result does not preclude SARS-Cov-2 infection and should not be used as the sole basis for treatment or other patient management decisions. A negative result may occur with   improper specimen collection/handling, submission of specimen other than nasopharyngeal swab, presence of viral mutation(s) within the areas targeted by this assay, and inadequate number of viral copies(<138 copies/mL). A negative result must be combined with clinical observations, patient history, and epidemiological information. The expected result is Negative.  Fact Sheet for Patients:  EntrepreneurPulse.com.au  Fact Sheet for Healthcare Providers:  IncredibleEmployment.be  This test is no t yet approved or cleared by the [place] FDA and  has been authorized for detection and/or diagnosis of SARS-CoV-2 by FDA under an Emergency Use Authorization (EUA). This EUA will remain  in effect (meaning this test can be used) for the duration of the COVID-19 declaration under Section 564(b)(1) of the Act, 21 U.S.C.section 360bbb-3(b)(1), unless the authorization is terminated  or revoked sooner.       Influenza A by PCR NEGATIVE NEGATIVE Final   Influenza B by PCR NEGATIVE NEGATIVE Final    Comment: (NOTE) The Xpert Xpress SARS-CoV-2/FLU/RSV plus assay is intended as an aid in the diagnosis of influenza from Nasopharyngeal swab specimens and should not be used as  a sole basis for treatment. Nasal washings and aspirates are unacceptable for Xpert Xpress SARS-CoV-2/FLU/RSV testing.  Fact Sheet for Patients: EntrepreneurPulse.com.au  Fact Sheet for Healthcare Providers: IncredibleEmployment.be  This test is not yet approved or cleared by the [place] FDA and has been authorized for detection and/or diagnosis of SARS-CoV-2 by FDA under an Emergency Use Authorization (EUA). This EUA will remain in effect (meaning this test can be used) for the duration of the COVID-19 declaration under Section 564(b)(1) of the Act, 21 U.S.C. section 360bbb-3(b)(1), unless the authorization is terminated  or revoked.  Performed at [facility], [address] [place], [place] [address]   MRSA Next Gen by PCR, Nasal     Status: None   Collection Time: 08/13/22  6:24 PM   Specimen: Nasal Mucosa; Nasal Swab  Result Value Ref Range Status   MRSA by PCR Next Gen NOT DETECTED NOT DETECTED Final    Comment: (NOTE) The GeneXpert MRSA Assay (FDA approved for NASAL specimens only), is one component of a comprehensive MRSA colonization surveillance program. It is not intended to diagnose MRSA infection nor to guide or monitor treatment for MRSA infections. Test performance is not FDA approved in patients less than 41 years old. Performed at [facility], [address] [place], [place] [address]      Radiology Studies: US THYROID  Result Date: 08/13/2022 CLINICAL DATA:  Incidental on CT. Left-sided thyroid nodule demonstrated on preceding chest CT EXAM: THYROID ULTRASOUND TECHNIQUE: Ultrasound examination of the thyroid gland and adjacent soft tissues was performed. COMPARISON:  Chest CT-earlier same day FINDINGS: Parenchymal Echotexture: Mildly heterogenous Isthmus: Normal in size measuring 0.4 cm in diameter Right lobe: Normal in size measuring 5.4 x 2.7 x 2.0 cm Left lobe: Normal in size measuring 5.7 x 2.5 x 2.5 cm _________________________________________________________ Estimated total number of nodules >/= 1 cm: 1 Number of spongiform nodules >/=  2 cm not described below (TR1): 0 Number of mixed cystic and solid nodules >/= 1.5 cm not described below (TR2): 0 _________________________________________________________ There is a punctate (approximate 0.8 cm) anechoic cyst within the superior pole of the right lobe of the thyroid (labeled 1) which contains an internal echogenic foci ring artifact compatible with benign colloid. This benign colloid containing cyst does not meet criteria to recommend percutaneous sampling or continued dedicated follow-up.  _________________________________________________________ Nodule # 2: Location: Left; Inferior correlates with the nodule questioned on preceding chest CT Maximum size: 2.9 cm; Other 2 dimensions: 2.6 x 2.5 cm Composition: solid/almost completely solid (2) Echogenicity: hypoechoic (2) Shape: not taller-than-wide (0) Margins: ill-defined (0) Echogenic foci: none (0) ACR TI-RADS total points: 4. ACR TI-RADS risk category: TR4 (4-6 points). ACR TI-RADS recommendations: **Given size (>/= 1.5 cm) and appearance, fine needle aspiration of this moderately suspicious nodule should be considered based on TI-RADS criteria. 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 The above is in keeping with the ACR TI-RADS recommendations - J Am Coll Radiol 2017;14:587-595. Electronically Signed   By: Sandi Mariscal M.D.   On: 08/13/2022 17:38   CT Chest Wo Contrast  Result Date: 08/13/2022 CLINICAL DATA:  Abnormal chest radiograph with opacity in the medial RIGHT lower lobe EXAM: CT CHEST WITHOUT CONTRAST TECHNIQUE: Multidetector CT imaging of the chest was performed following  the standard protocol without IV contrast. RADIATION DOSE REDUCTION: This exam was performed according to the departmental dose-optimization program which includes automated exposure control, adjustment of the mA and/or kV according to patient size and/or use of iterative reconstruction technique. COMPARISON:  Chest radiograph 08/13/2022 FINDINGS: Cardiovascular: Atherosclerotic calcifications aortic arch, coronary arteries, and proximal great vessels. Post CABG. Aorta normal caliber. Heart size normal. No  pericardial effusion. Mediastinum/Nodes: Question mass extending from inferior aspect of LEFT thyroid lobe, 2.8 x 1.4 cm image 26. Enlarged RIGHT paratracheal lymph node 11 mm image 51. Additional subcarinal nodal enlargement 14 mm image 83. Esophagus unremarkable. Lungs/Pleura: Peribronchial thickening. Interseptal thickening upper lungs with scattered subpleural interstitial thickening bilaterally. 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 Mass is confluence with RIGHT hilar adenopathy. Obstruction of the RIGHT lower lobe bronchus. Some infiltrate peripheral to mass. Calcified granuloma anterior RIGHT upper lobe. No additional masses. No pleural effusion. Upper Abdomen: Cholelithiasis. Splenule at splenic hilum. Low-attenuation lesion LEFT lobe liver anteriorly 14 mm image 140 consistent with cyst. Musculoskeletal: Post median sternotomy.  No acute osseous findings. 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 Mass is confluent with RIGHT hilar adenopathy with additional enlarged mediastinal nodes. Obstruction of the RIGHT lower lobe bronchus with peripheral infiltrate. 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 Cholelithiasis. Scattered atherosclerotic disease changes including coronary arteries post CABG. Suspected LEFT inferior thyroid mass 2.8 x 1.4 cm; recommend nonemergent thyroid ultrasound follow-up. Aortic Atherosclerosis (ICD10-I70.0).  Emphysema (ICD10-J43.9). Findings called to Dr. Langston Masker on 08/13/2022 at 1320 hours. Electronically Signed   By: Lavonia Dana M.D.   On: 08/13/2022 13:22   DG Chest 2 View  Result Date: 08/13/2022 CLINICAL DATA:  Cough and shortness of breath EXAM: CHEST - 2 VIEW COMPARISON:  Chest radiograph dated 04/03/2022 FINDINGS: Lines/tubes: Median sternotomy wires are nondisplaced. Fracture  of the inferior most sternotomy wire. Endoscopy clip projects over the stomach. Chest: Persistent rounded medial right lower lobe opacity. New patchy peripheral right lung opacities. Left lingular linear atelectasis/scarring. Pleura: No pneumothorax or pleural effusion. Heart/mediastinum: Similar postsurgical cardiomediastinal silhouette. Bones: No acute osseous abnormality. IMPRESSION: 1. Persistent rounded medial right lower lobe opacity suspicious for mass lesion. Recommend contrast enhanced chest CT for further evaluation. 2. New patchy peripheral right lung opacities. These can also be evaluated by CT. Electronically Signed   By: Darrin Nipper M.D.   On: 08/13/2022 12:05    Scheduled Meds:  Chlorhexidine Gluconate Cloth  6 each Topical Daily   diltiazem  30 mg Oral Q6H   ferrous sulfate  325 mg Oral Daily   furosemide  40 mg Intravenous Daily   gabapentin  300 mg Oral TID   metoprolol tartrate  25 mg Oral BID   pantoprazole  40 mg Oral BID AC   rosuvastatin  20 mg Oral Daily   Continuous Infusions:  heparin 1,700 Units/hr (08/14/22 0534)     LOS: 0 days   Time spent: 36 mins  Tomicka Lover Wynetta Emery, MD How to contact the [facility] Attending or Consulting provider [place] or covering provider during after hours [place], for this patient?  Check the care team in [facility] and look for a) attending/consulting [facility] provider listed and b) the [facility] team listed Log into [email or web address] and use 's universal password to access. If you do not have the password, please contact the  hospital operator. Locate the [facility] provider you are looking for under [facility] and page to a number that you can be directly reached. If you still have difficulty reaching the provider, please page the [facility] (Director on Call) for the Hospitalists listed on amion for assistance.  08/14/2022, 12:31 PM

## 2022-08-14 NOTE — Care Management Obs Status (Signed)
[place] NOTIFICATION   Patient Details  Name: Rodney Barnett MRN: [id number] Date of Birth: 03/13/1941   Medicare Observation Status Notification Given:  Yes    Tommy Medal 08/14/2022, 3:36 PM

## 2022-08-14 NOTE — Progress Notes (Signed)
*  PRELIMINARY RESULTS* Echocardiogram 2D Echocardiogram has been performed with Definity.  Samuel Germany 08/14/2022, 12:21 PM

## 2022-08-14 NOTE — Consult Note (Signed)
NAME:  Rodney Barnett, MRN:  [id number], DOB:  [birth year], LOS: 0 ADMISSION DATE:  08/13/2022, CONSULTATION DATE:  08/14/22  REFERRING MD: Irwin Brakeman , CHIEF COMPLAINT:  sob    History of Present Illness:  81 yowm remote smoker with IHD on ACEi  develped  cough and doe x sev weeks PTA much worse x sev days PTA with findings of Afib/RVR  and ? RLL lung mass on CT on admit and PCCM consulted ? Need for FOB?   Note significant wt loss/dental problem/ but  no cp/ fever/ purulent or bloody sputum or night sweats  Pertinent  Medical History  81 y.o. male, with past medical history of CAD status post CABG 1994,bladder cance, history of GI bleed in November 2022 due to AVM status post cautery , s/p TURBT and chemo in 2019, CKD-3A, chronic back pain, HTN and HLD . -Patient presents to ED today secondary to shortness of breath, reports progressive over the last few days, he does report orthopnea and exertional dyspnea as well, he does report persistent coughing, with some productive phlegm, he denies any chest pain, fever or chills, he reports lower extremity edema, usually worse at the end of the day upon prolonged standing, and improved with leg elevation and rest, he denies any chest pain, blood in stool or urine. -In ED he was noted to be in A-fib with RVR, new onset, he was dyspneic but with no oxygen requirement, CT chest significant for mass (new ), and thyroid mass, as well creatinine at 1.27, at baseline, hemoglobin 10.7, at baseline, [facility] hospitalist consulted to admit  Significant Hospital Events: Including procedures, antibiotic start and stop dates in addition to other pertinent events   -  Chest CT 11/9: 8.3 x 5.8 x 6.7 cm superior segment RIGHT lower lobe mass likel representing a large primary pulmonary neoplasm. Mass is confluent with RIGHT hilar adenopathy with additional enlarged mediastinal nodes. - Echo 11/10 >>>    Scheduled Meds:  Chlorhexidine Gluconate Cloth  6 each  Topical Daily   diltiazem  30 mg Oral Q6H   ferrous sulfate  325 mg Oral Daily   furosemide  40 mg Intravenous Daily   gabapentin  300 mg Oral TID   metoprolol tartrate  25 mg Oral BID   omega-3 acid ethyl esters  1 g Oral BID   pantoprazole  40 mg Oral Daily   rosuvastatin  20 mg Oral Daily   Continuous Infusions:  heparin 1,700 Units/hr (08/14/22 0534)   PRN Meds:.acetaminophen **OR** acetaminophen, albuterol, nitroGLYCERIN     Interim History / Subjective:  Up using BSC/ no sob bed to BSC on RA   Objective   Blood pressure (!) 106/52, pulse 98, temperature 98 F (36.7 C), resp. rate 16, height '6\' 1"'$  (1.854 m), weight 92.8 kg, SpO2 95 %.       No intake or output data in the 24 hours ending 08/14/22 0933 Filed Weights   08/13/22 1101 08/13/22 1827 08/14/22 0500  Weight: 105.2 kg 92 kg 92.8 kg    Examination: Tmax:  98.7 General appearance:    robust appearing elderly wm nad   At Rest 02 sats  93% on RA  No jvd Oropharynx clear,  mucosa nl Neck supple Lungs with decreased BS  R Base s localized wheeze  IRIR at around 100  no sign murmur Abd soft with nl  excursion  Extr warm with no edema or clubbing noted Neuro  Sensorium intact,  no apparent motor  deficits     Assessment & Plan:  1) Acute resp distress s hypoxemia, much better since admit and control of RAF  - echo pending pm 11/10  2) 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  >>> avoid restarting acei in setting of need for fob and possible intubation both of which can trigger a bradykinin cascade.    4 ) mild normocytic  anemia c/w chronic dz  >>> monitor per [facility]   Best Practice (right click and "Reselect all SmartList Selections" daily)   Per [facility]   Labs   CBC: Recent Labs  Lab 08/13/22 1231 08/14/22 0023  WBC 5.6 5.4  NEUTROABS 4.5  --   HGB 10.7* 9.9*  HCT 34.9* 31.6*  MCV 94.8 92.9  PLT 176 295    Basic Metabolic Panel: Recent Labs  Lab 08/13/22 1231 08/14/22 0023  NA 133* 133*  K 3.6 4.2  CL 100 101  CO2 26 23  GLUCOSE 124* 118*  BUN 15 15  CREATININE 1.27* 1.35*  CALCIUM 8.1* 8.1*  MG 1.9  --    GFR: Estimated Creatinine Clearance: 49.3 mL/min (A) (by C-G formula based on SCr of 1.35 mg/dL (H)). Recent Labs  Lab 08/13/22 1231 08/14/22 0023  WBC 5.6 5.4    Liver Function Tests: No results for input(s): "AST", "ALT", "ALKPHOS", "BILITOT", "PROT", "ALBUMIN" in the last 168 hours. No results for input(s): "LIPASE", "AMYLASE" in the last 168 hours. No results for input(s): "AMMONIA" in the last 168 hours.  ABG    Component Value Date/Time   TCO2 27 09/06/2009 1128     Coagulation Profile: No results for input(s): "INR", "PROTIME" in the last 168 hours.  Cardiac Enzymes: No results for input(s): "CKTOTAL", "CKMB", "CKMBINDEX", "TROPONINI" in the last 168 hours.  HbA1C: No results found for: "HGBA1C"  CBG: No results for input(s): "GLUCAP" in the last 168 hours.     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   Surgical History:   Past Surgical History:  Procedure Laterality Date   COLONOSCOPY WITH PROPOFOL N/A 09/01/2021   Procedure: COLONOSCOPY WITH PROPOFOL;  Surgeon: Lavena Bullion, DO;  Location: [facility];  Service: Gastroenterology;  Laterality: N/A;   CORONARY ARTERY BYPASS GRAFT  1994   LIMA to LAD, SVG to RCA, SVG to diagonal   ENDOSCOPIC MUCOSAL RESECTION  09/01/2021   Procedure: ENDOSCOPIC MUCOSAL RESECTION;  Surgeon: Lavena Bullion, DO;  Location: [facility];  Service: Gastroenterology;;   ESOPHAGOGASTRODUODENOSCOPY (EGD) WITH PROPOFOL N/A 09/01/2021   Procedure: ESOPHAGOGASTRODUODENOSCOPY (EGD) WITH PROPOFOL;  Surgeon: Lavena Bullion, DO;  Location: [facility];  Service: Gastroenterology;  Laterality: N/A;   HEMOSTASIS CLIP PLACEMENT  09/01/2021   Procedure: HEMOSTASIS CLIP PLACEMENT;  Surgeon: Lavena Bullion, DO;  Location: [facility];  Service: Gastroenterology;;   HOT HEMOSTASIS N/A 09/01/2021   Procedure: HOT HEMOSTASIS (ARGON PLASMA COAGULATION/BICAP);  Surgeon: Lavena Bullion, DO;  Location: [facility];  Service: Gastroenterology;  Laterality: N/A;   POLYPECTOMY  09/01/2021   Procedure: POLYPECTOMY;  Surgeon: Lavena Bullion, DO;  Location: [facility];  Service: Gastroenterology;;   SUBMUCOSAL LIFTING INJECTION  09/01/2021   Procedure: SUBMUCOSAL LIFTING INJECTION;  Surgeon: Lavena Bullion, DO;  Location: [facility];  Service: Gastroenterology;;   TONSILLECTOMY     TRANSURETHRAL RESECTION OF BLADDER TUMOR N/A 09/09/2018   Procedure: TRANSURETHRAL RESECTION OF BLADDER TUMOR (TURBT) WITH INSTILLATION OF POST OPERATIVE CHEMOTHERAPY;  Surgeon: Kathie Rhodes, MD;  Location: [facility];  Service: Urology;  Laterality: N/A;     Social History:   reports that he quit smoking about 29 years ago. His smoking use included cigarettes. He started smoking about 67 years ago. He has a 120.00 pack-year smoking history. He has never used smokeless tobacco. He reports that he does not drink alcohol and does not use drugs.   Family History:  His family history includes Cancer in his brother; Hypertension in an other family member.    Allergies Allergies  Allergen Reactions   Lipitor [Atorvastatin] Other (See Comments)    myalgia     Home Medications  Prior to Admission medications   Medication Sig Start Date End Date Taking? Authorizing Provider  aspirin 81 MG tablet Take 81 mg by mouth daily.   Yes [provider]  gabapentin (NEURONTIN) 300 MG capsule TAKE (2) CAPSULES BY MOUTH THREE TIMES DAILY. 07/22/22  Yes Burchette, Alinda Sierras, MD  Iron, Ferrous Sulfate, 325 (65 Fe) MG TABS Take 1 tablet by mouth daily. 10/17/21  Yes Thornton Park, MD  isosorbide mononitrate (IMDUR) 30 MG 24 hr tablet TAKE 1 TABLET ONCE DAILY. 06/23/22  Yes Burchette, Alinda Sierras, MD  lisinopril (ZESTRIL) 10 MG tablet Take 1 tablet (10 mg total) by mouth daily. 06/11/22  Yes Burchette, Alinda Sierras, MD  metoprolol tartrate (LOPRESSOR) 25 MG tablet TAKE (1) TABLET TWICE DAILY. 06/22/22  Yes Burchette, Alinda Sierras, MD  nitroGLYCERIN (NITROSTAT) 0.4 MG SL tablet Place 1 tablet (0.4 mg total) under the tongue every 5 (five) minutes x 3 doses as needed for chest pain (If no relief after 3rd dose, proceed to the ED for an evaluation or call 911). 08/20/21  Yes Burchette, Alinda Sierras, MD  Omega-3 Fatty Acids (FISH OIL) 1000 MG CPDR Take 1,000 mg by mouth daily.   Yes [provider]  pantoprazole (PROTONIX) 40 MG tablet TAKE 1 TABLET ONCE DAILY. 06/22/22  Yes Thornton Park, MD  rosuvastatin (CRESTOR) 20 MG tablet Take 1 tablet (20 mg total) by mouth daily. 06/11/22  Yes Burchette, Alinda Sierras, MD  HYDROcodone-acetaminophen (NORCO) 10-325 MG tablet TAKE (1) TABLET BY MOUTH EVERY SIX HOURS AS NEEDED Patient not taking: Reported on 08/13/2022 05/19/22   [name], MD      Christinia Gully, MD Pulmonary and [place] [phone]   After 7:00 pm call Elink  [phone]

## 2022-08-14 NOTE — Progress Notes (Signed)
ANTICOAGULATION CONSULT NOTE - Follow Up Consult  Pharmacy Consult for heparin Indication: atrial fibrillation  Patient Measurements: Height: '6\' 1"'$  (185.4 cm) Weight: 105.2 kg (231 lb 14.8 oz) IBW/kg (Calculated) : 79.9 HEPARIN DW (KG): 92    Labs: Recent Labs    08/13/22 1231 08/13/22 1512 08/14/22 0023 08/14/22 1041  HGB 10.7*  --  9.9*  --   HCT 34.9*  --  31.6*  --   PLT 176  --  156  --   HEPARINUNFRC  --   --  0.12* <0.10*  CREATININE 1.27*  --  1.35*  --   TROPONINIHS 10 10  --   --      Assessment: 81yo male  on heparin for new Afib.  HL sub therapeutic <0.1 and lower than previous level of 0.12. Rate was increased ; RN said no infusion issues or signs of bleeding.  Goal of Therapy:  Heparin level 0.3-0.7 units/ml   Plan:  Will rebolus with heparin 3000 units Increase heparin infusion by 3 units/kg/hr to 2000 units/hr and check level in 8 hours and daily while on heparin Monitor for S/S of bleeding F/U transition to po eliquis  Isac Sarna, BS Pharm D, BCPS Clinical Pharmacist 08/14/2022,12:47 PM

## 2022-08-15 DIAGNOSIS — E041 Nontoxic single thyroid nodule: Secondary | ICD-10-CM | POA: Diagnosis not present

## 2022-08-15 DIAGNOSIS — R918 Other nonspecific abnormal finding of lung field: Secondary | ICD-10-CM | POA: Diagnosis not present

## 2022-08-15 DIAGNOSIS — I4891 Unspecified atrial fibrillation: Secondary | ICD-10-CM | POA: Diagnosis not present

## 2022-08-15 LAB — CBC
HCT: 33.6 % — ABNORMAL LOW (ref 39.0–52.0)
Hemoglobin: 10.5 g/dL — ABNORMAL LOW (ref 13.0–17.0)
MCH: 29.2 pg (ref 26.0–34.0)
MCHC: 31.3 g/dL (ref 30.0–36.0)
MCV: 93.3 fL (ref 80.0–100.0)
Platelets: 150 10*3/uL (ref 150–400)
RBC: 3.6 MIL/uL — ABNORMAL LOW (ref 4.22–5.81)
RDW: 15.2 % (ref 11.5–15.5)
WBC: 5 10*3/uL (ref 4.0–10.5)
nRBC: 0 % (ref 0.0–0.2)

## 2022-08-15 LAB — BASIC METABOLIC PANEL
Anion gap: 10 (ref 5–15)
BUN: 20 mg/dL (ref 8–23)
CO2: 25 mmol/L (ref 22–32)
Calcium: 8.4 mg/dL — ABNORMAL LOW (ref 8.9–10.3)
Chloride: 99 mmol/L (ref 98–111)
Creatinine, Ser: 1.39 mg/dL — ABNORMAL HIGH (ref 0.61–1.24)
GFR, Estimated: 51 mL/min — ABNORMAL LOW (ref >60)
Glucose, Bld: 93 mg/dL (ref 70–99)
Potassium: 3.6 mmol/L (ref 3.5–5.1)
Sodium: 134 mmol/L — ABNORMAL LOW (ref 135–145)

## 2022-08-15 MED ORDER — FUROSEMIDE 10 MG/ML IJ SOLN
20.0000 mg | Freq: Every day | INTRAMUSCULAR | Status: DC
  Administered 2022-08-15 – 2022-08-16 (×2): 20 mg via INTRAVENOUS
  Filled 2022-08-15 (×2): qty 2

## 2022-08-15 MED ORDER — METOPROLOL TARTRATE 25 MG PO TABS
25.0000 mg | ORAL_TABLET | Freq: Two times a day (BID) | ORAL | Status: DC
  Administered 2022-08-15 – 2022-08-16 (×3): 25 mg via ORAL
  Filled 2022-08-15 (×3): qty 1

## 2022-08-15 MED ORDER — SODIUM CHLORIDE 0.9 % IV BOLUS
250.0000 mL | Freq: Once | INTRAVENOUS | Status: AC
  Administered 2022-08-15: 250 mL via INTRAVENOUS

## 2022-08-15 NOTE — Progress Notes (Signed)
PROGRESS NOTE   Rodney Barnett  MVH:[id number] DOB: 12-04-1940 DOA: 08/13/2022 PCP: [name], MD   Chief Complaint  Patient presents with   Shortness of Breath   Level of care: Stepdown  Brief Admission History:  81 y.o. male, with past medical history of CAD status post CABG 1994,bladder cance, history of GI bleed in November 2022 due to AVM status post cautery , s/p TURBT and chemo in 2019, CKD-3A, chronic back pain, HTN and HLD . -Patient presents to ED today secondary to shortness of breath, reports progressive over the last few days, he does report orthopnea and exertional dyspnea as well, he does report persistent coughing, with some productive phlegm, he denies any chest pain, fever or chills, he reports lower extremity edema, usually worse at the end of the day upon prolonged standing, and improved with leg elevation and rest, he denies any chest pain, blood in stool or urine. -In ED he was noted to be in A-fib with RVR, new onset, he was dyspneic but with no oxygen requirement, CT chest significant for mass (new ), and thyroid mass, as well creatinine at 1.27, at baseline, hemoglobin 10.7, at baseline, [facility] consulted to admit   Assessment and Plan: A-fib with RVR, new onset -New onset, heart rate uncontrolled, despite receiving IV metoprolol and being on home metoprolol, currently in the 120s, so he will be admitted to stepdown and started on Cardizem drip -CHA2DS2-VASc score> 2, will start on heparin GTT, will monitor carefully given history of GI bleed in the past-->when appropriate plan to transition to apixaban -Resume home dose metoprolol. -Potassium repleted -will check TSH-->2.6 -cardiology consulted and recommending switch to oral meds -HR not controlled and he is hypotensive.  I had to reduce metoprolol dose and add holding parameters.  I also reduced dose of IV lasix due to hypotension.  Pt adamant about leaving today. He would have to leave AMA.      Acute CHF (unspecified) -With volume overload, worsening lower extremity edema, elevated BNP, will check 2D echo-continue with daily weights, strict ins and out, He was initially on IV Lasix 40 mg daily with good diuresis however due to soft BPs we have had to reduce lasix IV to 20 mg daily.  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  He has been taken off diltiazem and started on metoprolol by cardiology service.     Lung mass Right Lung -Finding of right upper lung mass, suspicious for malignancy, will consult pulmonary, adding further recommendation about biopsy, biopsy likely will be done as an outpatient given he is currently with acute CHF -pulmonary consult requested - likely could have outpatient bronch/biopsy -outpatient oncology follow up -pt unsure he wants to pursue treatment, palliative consult not available for next 3 days at this hospital    Thyroid mass - suspicious -will check thyroid US-->will need percutaneous biopsy when able  - will check TSH-->2.607   CAD status post CABG -Hold aspirin given will be started on heparin -Continue with metoprolol and statin   Hypertension -Pressure is soft, will monitor closely, continue with metoprolol mainly for heart rate control -We will hold Imdur and lisinopril to allow more room for diuresis and Cardizem drip   Neuropathy -continue with gabapentin   Prolonged QTc -Continue to monitor on telemetry, replete potassium, avoid prolonging agents   Anemia -Continue with iron supplements   Hyperlipidemia -Continue with statin   GERD - continue with Protonix  DVT prophylaxis: IV heparin Code  Status: Full  Family Communication:    Consultants:  Cardiology   Procedures:   Antimicrobials:    Subjective: Pt says he was told he would go home today.  He denies CP, SOB and palpitations.  He is diuresing on IV lasix.   Objective: Vitals:   08/15/22 0930 08/15/22 1030 08/15/22 1200 08/15/22 1350  BP: (!) 89/52  (!) 95/47 (!) 109/50 (!) 93/54  Pulse: 89 81  (!) 102  Resp:  18  12  Temp:      TempSrc:      SpO2: 95% 96% 94% 93%  Weight:      Height:        Intake/Output Summary (Last 24 hours) at 08/15/2022 1423 Last data filed at 08/15/2022 1356 Gross per 24 hour  Intake 287.82 ml  Output 1050 ml  Net -762.18 ml   Filed Weights   08/13/22 1827 08/14/22 0500 08/15/22 0500  Weight: 92 kg 92.8 kg 91.3 kg   Examination:  General exam: Appears calm and comfortable, elderly male, awake. Respiratory system: Clear to auscultation. Respiratory effort normal. Cardiovascular system: irregularly irregular, normal S1 & S2 heard. No JVD, murmurs, rubs, gallops or clicks. No pedal edema. Gastrointestinal system: Abdomen is nondistended, soft and nontender. No organomegaly or masses felt. Normal bowel sounds heard. Central nervous system: Alert and oriented. No focal neurological deficits. Extremities: 1+ edema BLEs, Symmetric 5 x 5 power. Skin: No rashes, lesions or ulcers. Psychiatry: Judgement and insight appear poor. Mood & affect appropriate.   Data Reviewed: I have personally reviewed following labs and imaging studies  CBC: Recent Labs  Lab 08/13/22 1231 08/14/22 0023 08/15/22 0404  WBC 5.6 5.4 5.0  NEUTROABS 4.5  --   --   HGB 10.7* 9.9* 10.5*  HCT 34.9* 31.6* 33.6*  MCV 94.8 92.9 93.3  PLT 176 156 782    Basic Metabolic Panel: Recent Labs  Lab 08/13/22 1231 08/14/22 0023 08/15/22 0727  NA 133* 133* 134*  K 3.6 4.2 3.6  CL 100 101 99  CO2 '26 23 25  '$ GLUCOSE 124* 118* 93  BUN '15 15 20  '$ CREATININE 1.27* 1.35* 1.39*  CALCIUM 8.1* 8.1* 8.4*  MG 1.9  --   --     CBG: No results for input(s): "GLUCAP" in the last 168 hours.  Recent Results (from the past 240 hour(s))  Resp Panel by RT-PCR (Flu A&B, Covid) Anterior Nasal Swab     Status: None   Collection Time: 08/13/22 11:24 AM   Specimen: Anterior Nasal Swab  Result Value Ref Range Status   SARS Coronavirus 2 by RT  PCR NEGATIVE NEGATIVE Final    Comment: (NOTE) SARS-CoV-2 target nucleic acids are NOT DETECTED.  The SARS-CoV-2 RNA is generally detectable in upper respiratory specimens during the acute phase of infection. The lowest concentration of SARS-CoV-2 viral copies this assay can detect is 138 copies/mL. A negative result does not preclude SARS-Cov-2 infection and should not be used as the sole basis for treatment or other patient management decisions. A negative result may occur with  improper specimen collection/handling, submission of specimen other than nasopharyngeal swab, presence of viral mutation(s) within the areas targeted by this assay, and inadequate number of viral copies(<138 copies/mL). A negative result must be combined with clinical observations, patient history, and epidemiological information. The expected result is Negative.  Fact Sheet for Patients:  EntrepreneurPulse.com.au  Fact Sheet for Healthcare Providers:  IncredibleEmployment.be  This test is no t yet approved or cleared by the  [place] Architectural technologist and  has been authorized for detection and/or diagnosis of SARS-CoV-2 by FDA under an Print production planner (EUA). This EUA will remain  in effect (meaning this test can be used) for the duration of the COVID-19 declaration under Section 564(b)(1) of the Act, 21 U.S.C.section 360bbb-3(b)(1), unless the authorization is terminated  or revoked sooner.       Influenza A by PCR NEGATIVE NEGATIVE Final   Influenza B by PCR NEGATIVE NEGATIVE Final    Comment: (NOTE) The Xpert Xpress SARS-CoV-2/FLU/RSV plus assay is intended as an aid in the diagnosis of influenza from Nasopharyngeal swab specimens and should not be used as a sole basis for treatment. Nasal washings and aspirates are unacceptable for Xpert Xpress SARS-CoV-2/FLU/RSV testing.  Fact Sheet for Patients: EntrepreneurPulse.com.au  Fact Sheet for  Healthcare Providers: IncredibleEmployment.be  This test is not yet approved or cleared by the [place] FDA and has been authorized for detection and/or diagnosis of SARS-CoV-2 by FDA under an Emergency Use Authorization (EUA). This EUA will remain in effect (meaning this test can be used) for the duration of the COVID-19 declaration under Section 564(b)(1) of the Act, 21 U.S.C. section 360bbb-3(b)(1), unless the authorization is terminated or revoked.  Performed at [facility], [address], [place], [place] [address]   MRSA Next Gen by PCR, Nasal     Status: None   Collection Time: 08/13/22  6:24 PM   Specimen: Nasal Mucosa; Nasal Swab  Result Value Ref Range Status   MRSA by PCR Next Gen NOT DETECTED NOT DETECTED Final    Comment: (NOTE) The GeneXpert MRSA Assay (FDA approved for NASAL specimens only), is one component of a comprehensive MRSA colonization surveillance program. It is not intended to diagnose MRSA infection nor to guide or monitor treatment for MRSA infections. Test performance is not FDA approved in patients less than 53 years old. Performed at [facility], [address], [place], [place] [address]      Radiology Studies: ECHOCARDIOGRAM COMPLETE  Result Date: 08/14/2022    ECHOCARDIOGRAM REPORT   Patient Name:   Rodney Barnett Date of Exam: 08/14/2022 Medical Rec #:  [id number]    Height:       73.0 in Accession #:    [id number]   Weight:       204.6 lb Date of Birth:  04/18/1941    BSA:          2.172 m Patient Age:    43 years     BP:           111/76 mmHg Patient Gender: M            HR:           66 bpm. Exam Location:  [facility] Procedure: 2D Echo, Cardiac Doppler and Color Doppler Indications:    Atrial Fibrillation I48.91  History:        Patient has prior history of Echocardiogram examinations, most                 recent 08/29/2021. CHF, Arrythmias:Atrial Fibrillation; Risk                 Factors:Hypertension and Dyslipidemia. Hx of  lung mass, Bladder                 cancer.  Sonographer:    Alvino Chapel RCS Referring Phys: [place]  1. Left ventricular ejection fraction, by estimation, is 35 to 40%.  The left ventricle has moderately decreased function. The left ventricle has no regional wall motion abnormalities. Left ventricular diastolic function could not be evaluated.  2. Right ventricular systolic function is mildly reduced. The right ventricular size is mildly enlarged. There is moderately elevated pulmonary artery systolic pressure.  3. Left atrial size was mild to moderately dilated.  4. Right atrial size was moderately dilated.  5. The mitral valve is abnormal. Mild mitral valve regurgitation. No evidence of mitral stenosis.  6. The aortic valve is abnormal. There is severe calcifcation of the aortic valve. Aortic valve regurgitation is not visualized. No aortic stenosis is present.  7. The inferior vena cava is dilated in size with <50% respiratory variability, suggesting right atrial pressure of 15 mmHg. Comparison(s): Changes from prior study are noted. LVEF decreased from 60-65% to 35-40%. Mild aortic valve stenosis noted on prior study is not evident anymore probably due to low flow from reduced LVEF. Aortic valve not well visualized for planimetry. FINDINGS  Left Ventricle: Left ventricular ejection fraction, by estimation, is 35 to 40%. The left ventricle has moderately decreased function. The left ventricle has no regional wall motion abnormalities. Definity contrast agent was given IV to delineate the left ventricular endocardial borders. The left ventricular internal cavity size was normal in size. There is no left ventricular hypertrophy. Left ventricular diastolic function could not be evaluated due to atrial fibrillation. Left ventricular diastolic function could not be evaluated. Right Ventricle: The right ventricular size is mildly enlarged. No increase in right ventricular wall thickness.  Right ventricular systolic function is mildly reduced. There is moderately elevated pulmonary artery systolic pressure. The tricuspid regurgitant velocity is 2.91 m/s, and with an assumed right atrial pressure of 15 mmHg, the estimated right ventricular systolic pressure is 62.2 mmHg. Left Atrium: Left atrial size was mild to moderately dilated. Right Atrium: Right atrial size was moderately dilated. Pericardium: There is no evidence of pericardial effusion. Mitral Valve: The mitral valve is abnormal. There is mild thickening of the mitral valve leaflet(s). There is mild calcification of the mitral valve leaflet(s). Mild mitral annular calcification. Mild mitral valve regurgitation. No evidence of mitral valve stenosis. Tricuspid Valve: The tricuspid valve is normal in structure. Tricuspid valve regurgitation is mild . No evidence of tricuspid stenosis. Aortic Valve: The aortic valve is abnormal. There is severe calcifcation of the aortic valve. There is moderate aortic valve annular calcification. Aortic valve regurgitation is not visualized. No aortic stenosis is present. Aortic valve mean gradient measures 6.5 mmHg. Aortic valve peak gradient measures 12.7 mmHg. Aortic valve area, by VTI measures 1.18 cm. Pulmonic Valve: The pulmonic valve was normal in structure. Pulmonic valve regurgitation is trivial. No evidence of pulmonic stenosis. Aorta: The aortic root is normal in size and structure. Venous: The inferior vena cava is dilated in size with less than 50% respiratory variability, suggesting right atrial pressure of 15 mmHg. IAS/Shunts: No atrial level shunt detected by color flow Doppler.  LEFT VENTRICLE PLAX 2D LVIDd:         4.60 cm LVIDs:         3.20 cm LV PW:         0.90 cm LV IVS:        0.90 cm LVOT diam:     1.90 cm LV SV:         46 LV SV Index:   21 LVOT Area:     2.84 cm  RIGHT VENTRICLE TAPSE (M-mode): 1.7 cm LEFT ATRIUM  Index        RIGHT ATRIUM           Index LA diam:         4.90 cm 2.26 cm/m   RA Area:     25.10 cm LA Vol (A2C):   94.0 ml 43.27 ml/m  RA Volume:   82.80 ml  38.11 ml/m LA Vol (A4C):   59.6 ml 27.43 ml/m LA Biplane Vol: 79.8 ml 36.73 ml/m  AORTIC VALVE AV Area (Vmax):    1.10 cm AV Area (Vmean):   1.19 cm AV Area (VTI):     1.18 cm AV Vmax:           178.00 cm/s AV Vmean:          115.500 cm/s AV VTI:            0.391 m AV Peak Grad:      12.7 mmHg AV Mean Grad:      6.5 mmHg LVOT Vmax:         69.20 cm/s LVOT Vmean:        48.300 cm/s LVOT VTI:          0.163 m LVOT/AV VTI ratio: 0.42  AORTA Ao Root diam: 3.60 cm MITRAL VALVE                TRICUSPID VALVE MV Area (PHT): 5.32 cm     TR Peak grad:   33.9 mmHg MV Decel Time: 143 msec     TR Vmax:        291.00 cm/s MR Peak grad: 78.9 mmHg MR Mean grad: 54.0 mmHg     SHUNTS MR Vmax:      444.00 cm/s   Systemic VTI:  0.16 m MR Vmean:     348.0 cm/s    Systemic Diam: 1.90 cm MV E velocity: 102.50 cm/s Vishnu Priya Mallipeddi Electronically signed by Lorelee Cover Mallipeddi Signature Date/Time: 08/14/2022/1:34:17 PM    Final    US THYROID  Result Date: 08/13/2022 CLINICAL DATA:  Incidental on CT. Left-sided thyroid nodule demonstrated on preceding chest CT EXAM: THYROID ULTRASOUND TECHNIQUE: Ultrasound examination of the thyroid gland and adjacent soft tissues was performed. COMPARISON:  Chest CT-earlier same day FINDINGS: Parenchymal Echotexture: Mildly heterogenous Isthmus: Normal in size measuring 0.4 cm in diameter Right lobe: Normal in size measuring 5.4 x 2.7 x 2.0 cm Left lobe: Normal in size measuring 5.7 x 2.5 x 2.5 cm _________________________________________________________ Estimated total number of nodules >/= 1 cm: 1 Number of spongiform nodules >/=  2 cm not described below (TR1): 0 Number of mixed cystic and solid nodules >/= 1.5 cm not described below (TR2): 0 _________________________________________________________ There is a punctate (approximate 0.8 cm) anechoic cyst within the superior pole  of the right lobe of the thyroid (labeled 1) which contains an internal echogenic foci ring artifact compatible with benign colloid. This benign colloid containing cyst does not meet criteria to recommend percutaneous sampling or continued dedicated follow-up. _________________________________________________________ Nodule # 2: Location: Left; Inferior correlates with the nodule questioned on preceding chest CT Maximum size: 2.9 cm; Other 2 dimensions: 2.6 x 2.5 cm Composition: solid/almost completely solid (2) Echogenicity: hypoechoic (2) Shape: not taller-than-wide (0) Margins: ill-defined (0) Echogenic foci: none (0) ACR TI-RADS total points: 4. ACR TI-RADS risk category: TR4 (4-6 points). ACR TI-RADS recommendations: **Given size (>/= 1.5 cm) and appearance, fine needle aspiration of this moderately suspicious nodule should be considered based on TI-RADS criteria. 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 The above is in keeping with the ACR TI-RADS recommendations - J Am Coll Radiol 2017;14:587-595. Electronically Signed   By: Sandi Mariscal M.D.   On: 08/13/2022 17:38    Scheduled Meds:  apixaban  5 mg Oral BID   Chlorhexidine Gluconate Cloth  6 each Topical Daily   ferrous sulfate  325 mg Oral Daily   furosemide  20 mg Intravenous Daily   gabapentin  300 mg Oral TID   metoprolol tartrate  25 mg Oral BID   pantoprazole  40 mg Oral BID AC   rosuvastatin  20 mg Oral Daily   Continuous Infusions:   LOS: 0 days   Time spent: 35 mins  Evony Rezek Wynetta Emery, MD How to contact the [facility] Attending or Consulting provider [place] or covering provider during  after hours [place], for this patient?  Check the care team in [facility] and look for a) attending/consulting [facility] provider listed and b) the [facility] team listed Log into [email or web address] and use [place]'s universal password to access. If you do not have the password, please contact the hospital operator. Locate the [facility] provider you are looking for under [facility] and page to a number that you can be directly reached. If you still have difficulty reaching the provider, please page the [facility] (Director on Call) for the Hospitalists listed on amion for assistance.  08/15/2022, 2:23 PM

## 2022-08-15 NOTE — Progress Notes (Signed)
Chaplain was able to help Shabazz grandson and great grandson get up to his room to visit him.  Chaplain found them in the waiting area needing help.  Bransen was grateful to his family.     08/15/22 1100  Clinical Encounter Type  Visited With Patient and family together  Visit Type Initial

## 2022-08-16 DIAGNOSIS — R918 Other nonspecific abnormal finding of lung field: Secondary | ICD-10-CM | POA: Diagnosis not present

## 2022-08-16 DIAGNOSIS — E041 Nontoxic single thyroid nodule: Secondary | ICD-10-CM | POA: Diagnosis not present

## 2022-08-16 DIAGNOSIS — I5021 Acute systolic (congestive) heart failure: Secondary | ICD-10-CM | POA: Diagnosis not present

## 2022-08-16 DIAGNOSIS — I4891 Unspecified atrial fibrillation: Secondary | ICD-10-CM | POA: Diagnosis not present

## 2022-08-16 LAB — CBC
HCT: 33.7 % — ABNORMAL LOW (ref 39.0–52.0)
Hemoglobin: 10.6 g/dL — ABNORMAL LOW (ref 13.0–17.0)
MCH: 29.2 pg (ref 26.0–34.0)
MCHC: 31.5 g/dL (ref 30.0–36.0)
MCV: 92.8 fL (ref 80.0–100.0)
Platelets: 160 10*3/uL (ref 150–400)
RBC: 3.63 MIL/uL — ABNORMAL LOW (ref 4.22–5.81)
RDW: 15.3 % (ref 11.5–15.5)
WBC: 5.8 10*3/uL (ref 4.0–10.5)
nRBC: 0 % (ref 0.0–0.2)

## 2022-08-16 LAB — BASIC METABOLIC PANEL
Anion gap: 8 (ref 5–15)
BUN: 22 mg/dL (ref 8–23)
CO2: 26 mmol/L (ref 22–32)
Calcium: 8.2 mg/dL — ABNORMAL LOW (ref 8.9–10.3)
Chloride: 99 mmol/L (ref 98–111)
Creatinine, Ser: 1.34 mg/dL — ABNORMAL HIGH (ref 0.61–1.24)
GFR, Estimated: 54 mL/min — ABNORMAL LOW (ref >60)
Glucose, Bld: 103 mg/dL — ABNORMAL HIGH (ref 70–99)
Potassium: 3.5 mmol/L (ref 3.5–5.1)
Sodium: 133 mmol/L — ABNORMAL LOW (ref 135–145)

## 2022-08-16 LAB — MAGNESIUM: Magnesium: 1.8 mg/dL (ref 1.7–2.4)

## 2022-08-16 MED ORDER — METOPROLOL TARTRATE 25 MG PO TABS: 25.0000 mg | ORAL_TABLET | Freq: Two times a day (BID) | ORAL | 2 refills | Status: DC

## 2022-08-16 MED ORDER — FUROSEMIDE 40 MG PO TABS: 40.0000 mg | ORAL_TABLET | Freq: Every day | ORAL | 1 refills | Status: DC

## 2022-08-16 MED ORDER — APIXABAN 5 MG PO TABS: 5.0000 mg | ORAL_TABLET | Freq: Two times a day (BID) | ORAL | 2 refills | Status: DC

## 2022-08-16 MED ORDER — POTASSIUM CHLORIDE CRYS ER 20 MEQ PO TBCR
20.0000 meq | EXTENDED_RELEASE_TABLET | Freq: Every day | ORAL | Status: DC
  Administered 2022-08-16: 20 meq via ORAL
  Filled 2022-08-16: qty 1

## 2022-08-16 MED ORDER — MAGNESIUM SULFATE 2 GM/50ML IV SOLN
2.0000 g | Freq: Once | INTRAVENOUS | Status: AC
  Administered 2022-08-16: 2 g via INTRAVENOUS
  Filled 2022-08-16: qty 50

## 2022-08-16 MED ORDER — POTASSIUM CHLORIDE CRYS ER 20 MEQ PO TBCR: 20.0000 meq | EXTENDED_RELEASE_TABLET | Freq: Every day | ORAL | 1 refills | Status: DC

## 2022-08-16 NOTE — Discharge Summary (Signed)
Physician Discharge Summary  Rodney Barnett [facility] UVO:[id number] DOB: 29-Jun-1941 DOA: 08/13/2022  PCP: [name], MD Cardiology: Dennard Schaumann date: 08/13/2022 Discharge date: 08/16/2022  Admitted From:  Home  Disposition:  Home with Hospice Service   Recommendations for Outpatient Follow-up:  Pt has declined further work up or treatment for tumors in lung and thyroid with full decisional capacity Pt requested to have home hospice services which is arranged  Pt declined to stay in hospital for another day and requests to go home now  Home Health:  HOSPICE   Discharge Condition: HOSPICE   CODE STATUS: DNR/DNI DIET: low sodium recommended   Brief Hospitalization Summary: Please see all hospital notes, images, labs for full details of the hospitalization. Brief Admission History:  81 y.o. male, with past medical history of CAD status post CABG 1994,bladder cance, history of GI bleed in November 2022 due to AVM status post cautery , s/p TURBT and chemo in 2019, CKD-3A, chronic back pain, HTN and HLD . -Patient presents to ED today secondary to shortness of breath, reports progressive over the last few days, he does report orthopnea and exertional dyspnea as well, he does report persistent coughing, with some productive phlegm, he denies any chest pain, fever or chills, he reports lower extremity edema, usually worse at the end of the day upon prolonged standing, and improved with leg elevation and rest, he denies any chest pain, blood in stool or urine. -In ED he was noted to be in A-fib with RVR, new onset, he was dyspneic but with no oxygen requirement, CT chest significant for mass (new ), and thyroid mass, as well creatinine at 1.27, at baseline, hemoglobin 10.7, at baseline, [facility] hospitalist consulted to admit  Jeddo with RVR, new onset -New onset, heart rate uncontrolled, despite receiving IV metoprolol and being on home metoprolol, currently in the  120s, so he will be admitted to stepdown and started on Cardizem drip -CHA2DS2-VASc score> 2, will start on heparin GTT, will monitor carefully given history of GI bleed in the past-->when appropriate plan to transition to apixaban -Resume home dose metoprolol. -Potassium repleted -will check TSH-->2.6 -cardiology consulted and recommending switch to oral meds.  Unfortunately his BP could not tolerate metoprolol 25 mg Q6 hours and we have reduced to twice daily and he is tolerating that and his heart rate is reasonably controlled.  He is not willing to stay in hospital for further management and wants to go home with hospice care.    -HR not controlled and he is hypotensive.  I had to reduce metoprolol dose and add holding parameters.  I also reduced dose of IV lasix due to hypotension.  Pt adamant about leaving today and will not stay another day in hospital.     Acute CHF (systolic) / Cardiomyopathy  -With volume overload, worsening lower extremity edema, elevated BNP, will check 2D echo-continue with daily weights, strict ins and out, He was initially on IV Lasix 40 mg daily with good diuresis however due to soft BPs we have had to reduce lasix IV to 20 mg daily.  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  He has been taken off diltiazem and started on metoprolol by cardiology service. -diuresis limited by low blood pressures,  he has been started on oral lasix 40 mg daily with a potassium supplement.  He requested to go home today with hospice care.  We have made arrangements.  Lung mass Right Lung -Finding of right upper lung mass, suspicious for malignancy, will consult pulmonary, adding further recommendation about biopsy, biopsy likely will be done as an outpatient given he is currently with acute CHF -pulmonary consult requested - likely could have outpatient bronch/biopsy -outpatient oncology follow up -08/16/2022:  Pt has decided and clearly informed me with full decisional  capacity that he does NOT want further work up or treatment of the tumors in his lung and thyroid.  He wants to go home with hospice care.  He understands that this is a terminal illness. He is DNR/DNI.    He wants to discharge home now and will not stay in hospital another day.     Thyroid mass - suspicious -will check thyroid US-->would need percutaneous biopsy if pursuing diagnosis and treatment - will check TSH-->2.607 - pt decided he did not want to pursue diagnosis or treatment and will go home with hospice.    CAD status post CABG -Hold aspirin given will be started on heparin -Continue with metoprolol and statin   Hypertension -Pressure is soft, will monitor closely, continue with metoprolol mainly for heart rate control -DC Imdur and lisinopril due to soft BPs and need to continue metoprolol for heart rate control.    Neuropathy -continue with gabapentin   Prolonged QTc -Continue to monitor on telemetry, replete potassium, avoid prolonging agents   Anemia -Continue with iron supplements   Hyperlipidemia -Continue with statin   GERD - continue with Protonix   DVT prophylaxis: IV heparin--->apixaban Code Status: DNR    Consultants:  Cardiology   Discharge Diagnoses:  Principal Problem:   Atrial fibrillation with RVR ([place]) Active Problems:   Hyperlipidemia   Essential hypertension   CORONARY ATHEROSCLEROSIS NATIVE CORONARY ARTERY   Iron deficiency anemia   Lung mass   Thyroid nodule   Acute systolic heart failure (HCC)   Coronary artery disease involving coronary bypass graft of native heart without angina pectoris   Discharge Instructions:  Allergies as of 08/16/2022       Reactions   Lipitor [atorvastatin] Other (See Comments)   myalgia        Medication List     STOP taking these medications    aspirin 81 MG tablet   HYDROcodone-acetaminophen 10-325 MG tablet Commonly known as: NORCO   isosorbide mononitrate 30 MG 24 hr tablet Commonly  known as: IMDUR   lisinopril 10 MG tablet Commonly known as: ZESTRIL       TAKE these medications    apixaban 5 MG Tabs tablet Commonly known as: ELIQUIS Take 1 tablet (5 mg total) by mouth 2 (two) times daily.   Fish Oil 1000 MG Cpdr Take 1,000 mg by mouth daily.   furosemide 40 MG tablet Commonly known as: Lasix Take 1 tablet (40 mg total) by mouth daily.   gabapentin 300 MG capsule Commonly known as: NEURONTIN TAKE (2) CAPSULES BY MOUTH THREE TIMES DAILY.   Iron (Ferrous Sulfate) 325 (65 Fe) MG Tabs Take 1 tablet by mouth daily.   metoprolol tartrate 25 MG tablet Commonly known as: LOPRESSOR Take 1 tablet (25 mg total) by mouth 2 (two) times daily. What changed: See the new instructions.   nitroGLYCERIN 0.4 MG SL tablet Commonly known as: Nitrostat Place 1 tablet (0.4 mg total) under the tongue every 5 (five) minutes x 3 doses as needed for chest pain (If no relief after 3rd dose, proceed to the ED for an evaluation or call 911).   pantoprazole 40  MG tablet Commonly known as: PROTONIX TAKE 1 TABLET ONCE DAILY.   potassium chloride SA 20 MEQ tablet Commonly known as: KLOR-CON M Take 1 tablet (20 mEq total) by mouth daily. Start taking on: August 17, 2022   rosuvastatin 20 MG tablet Commonly known as: CRESTOR Take 1 tablet (20 mg total) by mouth daily.        Follow-up Information     [facility] [place]. Schedule an appointment as soon as possible for a visit in 1 week(s).   Specialty: Cardiology Why: Hospital Follow Up Contact information: [place] [id number] [place] [phone]               Allergies  Allergen Reactions   Lipitor [Atorvastatin] Other (See Comments)    myalgia   Allergies as of 08/16/2022       Reactions   Lipitor [atorvastatin] Other (See Comments)   myalgia        Medication List     STOP taking these  medications    aspirin 81 MG tablet   HYDROcodone-acetaminophen 10-325 MG tablet Commonly known as: NORCO   isosorbide mononitrate 30 MG 24 hr tablet Commonly known as: IMDUR   lisinopril 10 MG tablet Commonly known as: ZESTRIL       TAKE these medications    apixaban 5 MG Tabs tablet Commonly known as: ELIQUIS Take 1 tablet (5 mg total) by mouth 2 (two) times daily.   Fish Oil 1000 MG Cpdr Take 1,000 mg by mouth daily.   furosemide 40 MG tablet Commonly known as: Lasix Take 1 tablet (40 mg total) by mouth daily.   gabapentin 300 MG capsule Commonly known as: NEURONTIN TAKE (2) CAPSULES BY MOUTH THREE TIMES DAILY.   Iron (Ferrous Sulfate) 325 (65 Fe) MG Tabs Take 1 tablet by mouth daily.   metoprolol tartrate 25 MG tablet Commonly known as: LOPRESSOR Take 1 tablet (25 mg total) by mouth 2 (two) times daily. What changed: See the new instructions.   nitroGLYCERIN 0.4 MG SL tablet Commonly known as: Nitrostat Place 1 tablet (0.4 mg total) under the tongue every 5 (five) minutes x 3 doses as needed for chest pain (If no relief after 3rd dose, proceed to the ED for an evaluation or call 911).   pantoprazole 40 MG tablet Commonly known as: PROTONIX TAKE 1 TABLET ONCE DAILY.   potassium chloride SA 20 MEQ tablet Commonly known as: KLOR-CON M Take 1 tablet (20 mEq total) by mouth daily. Start taking on: August 17, 2022   rosuvastatin 20 MG tablet Commonly known as: CRESTOR Take 1 tablet (20 mg total) by mouth daily.        Procedures/Studies: ECHOCARDIOGRAM COMPLETE  Result Date: 08/14/2022    ECHOCARDIOGRAM REPORT   Patient Name:   Rodney Barnett Date of Exam: 08/14/2022 Medical Rec #:  [id number]    Height:       73.0 in Accession #:    [id number]   Weight:       204.6 lb Date of Birth:  12-28-1940    BSA:          2.172 m Patient Age:    45 years     BP:           111/76 mmHg Patient Gender: M            HR:  66 bpm. Exam Location:  [facility]  Procedure: 2D Echo, Cardiac Doppler and Color Doppler Indications:    Atrial Fibrillation I48.91  History:        Patient has prior history of Echocardiogram examinations, most                 recent 08/29/2021. CHF, Arrythmias:Atrial Fibrillation; Risk                 Factors:Hypertension and Dyslipidemia. Hx of lung mass, Bladder                 cancer.  Sonographer:    Alvino Chapel RCS Referring Phys: [place]  1. Left ventricular ejection fraction, by estimation, is 35 to 40%. The left ventricle has moderately decreased function. The left ventricle has no regional wall motion abnormalities. Left ventricular diastolic function could not be evaluated.  2. Right ventricular systolic function is mildly reduced. The right ventricular size is mildly enlarged. There is moderately elevated pulmonary artery systolic pressure.  3. Left atrial size was mild to moderately dilated.  4. Right atrial size was moderately dilated.  5. The mitral valve is abnormal. Mild mitral valve regurgitation. No evidence of mitral stenosis.  6. The aortic valve is abnormal. There is severe calcifcation of the aortic valve. Aortic valve regurgitation is not visualized. No aortic stenosis is present.  7. The inferior vena cava is dilated in size with <50% respiratory variability, suggesting right atrial pressure of 15 mmHg. Comparison(s): Changes from prior study are noted. LVEF decreased from 60-65% to 35-40%. Mild aortic valve stenosis noted on prior study is not evident anymore probably due to low flow from reduced LVEF. Aortic valve not well visualized for planimetry. FINDINGS  Left Ventricle: Left ventricular ejection fraction, by estimation, is 35 to 40%. The left ventricle has moderately decreased function. The left ventricle has no regional wall motion abnormalities. Definity contrast agent was given IV to delineate the left ventricular endocardial borders. The left ventricular internal cavity size was  normal in size. There is no left ventricular hypertrophy. Left ventricular diastolic function could not be evaluated due to atrial fibrillation. Left ventricular diastolic function could not be evaluated. Right Ventricle: The right ventricular size is mildly enlarged. No increase in right ventricular wall thickness. Right ventricular systolic function is mildly reduced. There is moderately elevated pulmonary artery systolic pressure. The tricuspid regurgitant velocity is 2.91 m/s, and with an assumed right atrial pressure of 15 mmHg, the estimated right ventricular systolic pressure is 90.2 mmHg. Left Atrium: Left atrial size was mild to moderately dilated. Right Atrium: Right atrial size was moderately dilated. Pericardium: There is no evidence of pericardial effusion. Mitral Valve: The mitral valve is abnormal. There is mild thickening of the mitral valve leaflet(s). There is mild calcification of the mitral valve leaflet(s). Mild mitral annular calcification. Mild mitral valve regurgitation. No evidence of mitral valve stenosis. Tricuspid Valve: The tricuspid valve is normal in structure. Tricuspid valve regurgitation is mild . No evidence of tricuspid stenosis. Aortic Valve: The aortic valve is abnormal. There is severe calcifcation of the aortic valve. There is moderate aortic valve annular calcification. Aortic valve regurgitation is not visualized. No aortic stenosis is present. Aortic valve mean gradient measures 6.5 mmHg. Aortic valve peak gradient measures 12.7 mmHg. Aortic valve area, by VTI measures 1.18 cm. Pulmonic Valve: The pulmonic valve was normal in structure. Pulmonic valve regurgitation is trivial. No evidence of pulmonic stenosis. Aorta: The aortic root is  normal in size and structure. Venous: The inferior vena cava is dilated in size with less than 50% respiratory variability, suggesting right atrial pressure of 15 mmHg. IAS/Shunts: No atrial level shunt detected by color flow Doppler.  LEFT  VENTRICLE PLAX 2D LVIDd:         4.60 cm LVIDs:         3.20 cm LV PW:         0.90 cm LV IVS:        0.90 cm LVOT diam:     1.90 cm LV SV:         46 LV SV Index:   21 LVOT Area:     2.84 cm  RIGHT VENTRICLE TAPSE (M-mode): 1.7 cm LEFT ATRIUM             Index        RIGHT ATRIUM           Index LA diam:        4.90 cm 2.26 cm/m   RA Area:     25.10 cm LA Vol (A2C):   94.0 ml 43.27 ml/m  RA Volume:   82.80 ml  38.11 ml/m LA Vol (A4C):   59.6 ml 27.43 ml/m LA Biplane Vol: 79.8 ml 36.73 ml/m  AORTIC VALVE AV Area (Vmax):    1.10 cm AV Area (Vmean):   1.19 cm AV Area (VTI):     1.18 cm AV Vmax:           178.00 cm/s AV Vmean:          115.500 cm/s AV VTI:            0.391 m AV Peak Grad:      12.7 mmHg AV Mean Grad:      6.5 mmHg LVOT Vmax:         69.20 cm/s LVOT Vmean:        48.300 cm/s LVOT VTI:          0.163 m LVOT/AV VTI ratio: 0.42  AORTA Ao Root diam: 3.60 cm MITRAL VALVE                TRICUSPID VALVE MV Area (PHT): 5.32 cm     TR Peak grad:   33.9 mmHg MV Decel Time: 143 msec     TR Vmax:        291.00 cm/s MR Peak grad: 78.9 mmHg MR Mean grad: 54.0 mmHg     SHUNTS MR Vmax:      444.00 cm/s   Systemic VTI:  0.16 m MR Vmean:     348.0 cm/s    Systemic Diam: 1.90 cm MV E velocity: 102.50 cm/s Vishnu Priya Mallipeddi Electronically signed by Lorelee Cover Mallipeddi Signature Date/Time: 08/14/2022/1:34:17 PM    Final    US THYROID  Result Date: 08/13/2022 CLINICAL DATA:  Incidental on CT. Left-sided thyroid nodule demonstrated on preceding chest CT EXAM: THYROID ULTRASOUND TECHNIQUE: Ultrasound examination of the thyroid gland and adjacent soft tissues was performed. COMPARISON:  Chest CT-earlier same day FINDINGS: Parenchymal Echotexture: Mildly heterogenous Isthmus: Normal in size measuring 0.4 cm in diameter Right lobe: Normal in size measuring 5.4 x 2.7 x 2.0 cm Left lobe: Normal in size measuring 5.7 x 2.5 x 2.5 cm _________________________________________________________ Estimated total  number of nodules >/= 1 cm: 1 Number of spongiform nodules >/=  2 cm not described below (TR1): 0 Number of mixed cystic and solid nodules >/= 1.5 cm not described below (  TR2): 0 _________________________________________________________ There is a punctate (approximate 0.8 cm) anechoic cyst within the superior pole of the right lobe of the thyroid (labeled 1) which contains an internal echogenic foci ring artifact compatible with benign colloid. This benign colloid containing cyst does not meet criteria to recommend percutaneous sampling or continued dedicated follow-up. _________________________________________________________ Nodule # 2: Location: Left; Inferior correlates with the nodule questioned on preceding chest CT Maximum size: 2.9 cm; Other 2 dimensions: 2.6 x 2.5 cm Composition: solid/almost completely solid (2) Echogenicity: hypoechoic (2) Shape: not taller-than-wide (0) Margins: ill-defined (0) Echogenic foci: none (0) ACR TI-RADS total points: 4. ACR TI-RADS risk category: TR4 (4-6 points). ACR TI-RADS recommendations: **Given size (>/= 1.5 cm) and appearance, fine needle aspiration of this moderately suspicious nodule should be considered based on TI-RADS criteria. 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 The above is in keeping with the ACR TI-RADS recommendations - J Am Coll Radiol 2017;14:587-595. Electronically Signed   By: Sandi Mariscal M.D.   On: 08/13/2022 17:38   CT Chest Wo Contrast  Result Date: 08/13/2022 CLINICAL DATA:  Abnormal chest radiograph with opacity in the medial RIGHT lower lobe EXAM: CT CHEST WITHOUT CONTRAST TECHNIQUE:  Multidetector CT imaging of the chest was performed following the standard protocol without IV contrast. RADIATION DOSE REDUCTION: This exam was performed according to the departmental dose-optimization program which includes automated exposure control, adjustment of the mA and/or kV according to patient size and/or use of iterative reconstruction technique. COMPARISON:  Chest radiograph 08/13/2022 FINDINGS: Cardiovascular: Atherosclerotic calcifications aortic arch, coronary arteries, and proximal great vessels. Post CABG. Aorta normal caliber. Heart size normal. No pericardial effusion. Mediastinum/Nodes: Question mass extending from inferior aspect of LEFT thyroid lobe, 2.8 x 1.4 cm image 26. Enlarged RIGHT paratracheal lymph node 11 mm image 51. Additional subcarinal nodal enlargement 14 mm image 83. Esophagus unremarkable. Lungs/Pleura: Peribronchial thickening. Interseptal thickening upper lungs with scattered subpleural interstitial thickening bilaterally. 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 Mass is confluence with RIGHT hilar adenopathy. Obstruction of the RIGHT lower lobe bronchus. Some infiltrate peripheral to mass. Calcified granuloma anterior RIGHT upper lobe. No additional masses. No pleural effusion. Upper Abdomen: Cholelithiasis. Splenule at splenic hilum. Low-attenuation lesion LEFT lobe liver anteriorly 14 mm image 140 consistent with cyst. Musculoskeletal: Post median sternotomy.  No acute osseous findings. 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 Mass is confluent with RIGHT hilar adenopathy with additional enlarged mediastinal nodes. Obstruction of the RIGHT lower lobe bronchus with peripheral infiltrate. 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 Cholelithiasis. Scattered atherosclerotic disease  changes including coronary arteries post CABG. Suspected LEFT inferior thyroid mass 2.8 x 1.4 cm; recommend nonemergent thyroid ultrasound follow-up. Aortic Atherosclerosis (ICD10-I70.0).  Emphysema (ICD10-J43.9). Findings called to Dr. Langston Masker on 08/13/2022 at 1320 hours. Electronically Signed   By: Lavonia Dana M.D.   On: 08/13/2022 13:22   DG Chest 2 View  Result Date: 08/13/2022 CLINICAL DATA:  Cough and shortness of breath EXAM: CHEST - 2 VIEW COMPARISON:  Chest radiograph dated 04/03/2022 FINDINGS: Lines/tubes: Median sternotomy wires are nondisplaced. Fracture of the inferior most sternotomy wire. Endoscopy  clip projects over the stomach. Chest: Persistent rounded medial right lower lobe opacity. New patchy peripheral right lung opacities. Left lingular linear atelectasis/scarring. Pleura: No pneumothorax or pleural effusion. Heart/mediastinum: Similar postsurgical cardiomediastinal silhouette. Bones: No acute osseous abnormality. IMPRESSION: 1. Persistent rounded medial right lower lobe opacity suspicious for mass lesion. Recommend contrast enhanced chest CT for further evaluation. 2. New patchy peripheral right lung opacities. These can also be evaluated by CT. Electronically Signed   By: Darrin Nipper M.D.   On: 08/13/2022 12:05     Subjective: Pt says he decided to go home today and wants to accept hospice care at home.  He does NOT want to pursue working up or treating the tumor in his lungs and thyroid.  He has no complaints now and feels well and wants to get home to enjoy the remaining quality time he has left.    Discharge Exam: Vitals:   08/16/22 0809 08/16/22 0900  BP:  (!) 104/51  Pulse: (!) 122 65  Resp:  16  Temp:    SpO2:  95%   Vitals:   08/16/22 0745 08/16/22 0800 08/16/22 0809 08/16/22 0900  BP:  115/72  (!) 104/51  Pulse: 86 (!) 25 (!) 122 65  Resp: (!) 22 (!) 24  16  Temp: 98.6 F (37 C)     TempSrc: Oral     SpO2: 96% 95%  95%  Weight:      Height:       General  exam: Appears calm and comfortable, elderly male, awake. He has full decisional capacity in my opinion.  Respiratory system: Clear to auscultation. Respiratory effort normal. Cardiovascular system: irregularly irregular, normal S1 & S2 heard. No JVD, murmurs, rubs, gallops or clicks. No pedal edema. Gastrointestinal system: Abdomen is nondistended, soft and nontender. No organomegaly or masses felt. Normal bowel sounds heard. Central nervous system: Alert and oriented. No focal neurological deficits. Extremities: 1+ edema BLEs, Symmetric 5 x 5 power. Skin: No rashes, lesions or ulcers. Psychiatry: Judgement and insight appear normal. Mood & affect appropriate.    The results of significant diagnostics from this hospitalization (including imaging, microbiology, ancillary and laboratory) are listed below for reference.     Microbiology: Recent Results (from the past 240 hour(s))  Resp Panel by RT-PCR (Flu A&B, Covid) Anterior Nasal Swab     Status: None   Collection Time: 08/13/22 11:24 AM   Specimen: Anterior Nasal Swab  Result Value Ref Range Status   SARS Coronavirus 2 by RT PCR NEGATIVE NEGATIVE Final    Comment: (NOTE) SARS-CoV-2 target nucleic acids are NOT DETECTED.  The SARS-CoV-2 RNA is generally detectable in upper respiratory specimens during the acute phase of infection. The lowest concentration of SARS-CoV-2 viral copies this assay can detect is 138 copies/mL. A negative result does not preclude SARS-Cov-2 infection and should not be used as the sole basis for treatment or other patient management decisions. A negative result may occur with  improper specimen collection/handling, submission of specimen other than nasopharyngeal swab, presence of viral mutation(s) within the areas targeted by this assay, and inadequate number of viral copies(<138 copies/mL). A negative result must be combined with clinical observations, patient history, and epidemiological information. The  expected result is Negative.  Fact Sheet for Patients:  EntrepreneurPulse.com.au  Fact Sheet for Healthcare Providers:  IncredibleEmployment.be  This test is no t yet approved or cleared by the [place] FDA and  has been authorized for detection and/or diagnosis of SARS-CoV-2 by FDA under  an Emergency Use Authorization (EUA). This EUA will remain  in effect (meaning this test can be used) for the duration of the COVID-19 declaration under Section 564(b)(1) of the Act, 21 U.S.C.section 360bbb-3(b)(1), unless the authorization is terminated  or revoked sooner.       Influenza A by PCR NEGATIVE NEGATIVE Final   Influenza B by PCR NEGATIVE NEGATIVE Final    Comment: (NOTE) The Xpert Xpress SARS-CoV-2/FLU/RSV plus assay is intended as an aid in the diagnosis of influenza from Nasopharyngeal swab specimens and should not be used as a sole basis for treatment. Nasal washings and aspirates are unacceptable for Xpert Xpress SARS-CoV-2/FLU/RSV testing.  Fact Sheet for Patients: EntrepreneurPulse.com.au  Fact Sheet for Healthcare Providers: IncredibleEmployment.be  This test is not yet approved or cleared by the [place] FDA and has been authorized for detection and/or diagnosis of SARS-CoV-2 by FDA under an Emergency Use Authorization (EUA). This EUA will remain in effect (meaning this test can be used) for the duration of the COVID-19 declaration under Section 564(b)(1) of the Act, 21 U.S.C. section 360bbb-3(b)(1), unless the authorization is terminated or revoked.  Performed at [facility], [address], [place], [place] [address]   MRSA Next Gen by PCR, Nasal     Status: None   Collection Time: 08/13/22  6:24 PM   Specimen: Nasal Mucosa; Nasal Swab  Result Value Ref Range Status   MRSA by PCR Next Gen NOT DETECTED NOT DETECTED Final    Comment: (NOTE) The GeneXpert MRSA Assay (FDA approved for  NASAL specimens only), is one component of a comprehensive MRSA colonization surveillance program. It is not intended to diagnose MRSA infection nor to guide or monitor treatment for MRSA infections. Test performance is not FDA approved in patients less than 46 years old. Performed at [facility], [address], [place], [place] [address]      Labs: BNP (last 3 results) Recent Labs    08/13/22 1231  BNP 856.3*   Basic Metabolic Panel: Recent Labs  Lab 08/13/22 1231 08/14/22 0023 08/15/22 0727 08/16/22 0219  NA 133* 133* 134* 133*  K 3.6 4.2 3.6 3.5  CL 100 101 99 99  CO2 '26 23 25 26  '$ GLUCOSE 124* 118* 93 103*  BUN '15 15 20 22  '$ CREATININE 1.27* 1.35* 1.39* 1.34*  CALCIUM 8.1* 8.1* 8.4* 8.2*  MG 1.9  --   --  1.8   Liver Function Tests: No results for input(s): "AST", "ALT", "ALKPHOS", "BILITOT", "PROT", "ALBUMIN" in the last 168 hours. No results for input(s): "LIPASE", "AMYLASE" in the last 168 hours. No results for input(s): "AMMONIA" in the last 168 hours. CBC: Recent Labs  Lab 08/13/22 1231 08/14/22 0023 08/15/22 0404 08/16/22 0219  WBC 5.6 5.4 5.0 5.8  NEUTROABS 4.5  --   --   --   HGB 10.7* 9.9* 10.5* 10.6*  HCT 34.9* 31.6* 33.6* 33.7*  MCV 94.8 92.9 93.3 92.8  PLT 176 156 150 160   Cardiac Enzymes: No results for input(s): "CKTOTAL", "CKMB", "CKMBINDEX", "TROPONINI" in the last 168 hours. BNP: Invalid input(s): "POCBNP" CBG: No results for input(s): "GLUCAP" in the last 168 hours. D-Dimer No results for input(s): "DDIMER" in the last 72 hours. Hgb A1c No results for input(s): "HGBA1C" in the last 72 hours. Lipid Profile No results for input(s): "CHOL", "HDL", "LDLCALC", "TRIG", "CHOLHDL", "LDLDIRECT" in the last 72 hours. Thyroid function studies Recent Labs    08/13/22 1231  TSH 2.607   Anemia work up  No results for input(s): "VITAMINB12", "FOLATE", "FERRITIN", "TIBC", "IRON", "RETICCTPCT" in the last 72 hours. Urinalysis    Component  Value Date/Time   COLORURINE yellow 05/14/2010 0900   APPEARANCEUR Clear 05/14/2010 0900   LABSPEC <1.005 05/14/2010 0900   PHURINE 6.0 05/14/2010 0900   HGBUR large 05/14/2010 0900   BILIRUBINUR negative 02/17/2021 1139   PROTEINUR Negative 02/17/2021 1139   UROBILINOGEN 0.2 02/17/2021 1139   UROBILINOGEN 0.2 05/14/2010 0900   NITRITE positive 02/17/2021 1139   NITRITE positive 05/14/2010 0900   LEUKOCYTESUR Large (3+) (A) 02/17/2021 1139   Sepsis Labs Recent Labs  Lab 08/13/22 1231 08/14/22 0023 08/15/22 0404 08/16/22 0219  WBC 5.6 5.4 5.0 5.8   Microbiology Recent Results (from the past 240 hour(s))  Resp Panel by RT-PCR (Flu A&B, Covid) Anterior Nasal Swab     Status: None   Collection Time: 08/13/22 11:24 AM   Specimen: Anterior Nasal Swab  Result Value Ref Range Status   SARS Coronavirus 2 by RT PCR NEGATIVE NEGATIVE Final    Comment: (NOTE) SARS-CoV-2 target nucleic acids are NOT DETECTED.  The SARS-CoV-2 RNA is generally detectable in upper respiratory specimens during the acute phase of infection. The lowest concentration of SARS-CoV-2 viral copies this assay can detect is 138 copies/mL. A negative result does not preclude SARS-Cov-2 infection and should not be used as the sole basis for treatment or other patient management decisions. A negative result may occur with  improper specimen collection/handling, submission of specimen other than nasopharyngeal swab, presence of viral mutation(s) within the areas targeted by this assay, and inadequate number of viral copies(<138 copies/mL). A negative result must be combined with clinical observations, patient history, and epidemiological information. The expected result is Negative.  Fact Sheet for Patients:  EntrepreneurPulse.com.au  Fact Sheet for Healthcare Providers:  IncredibleEmployment.be  This test is no t yet approved or cleared by the [place] FDA and  has been  authorized for detection and/or diagnosis of SARS-CoV-2 by FDA under an Emergency Use Authorization (EUA). This EUA will remain  in effect (meaning this test can be used) for the duration of the COVID-19 declaration under Section 564(b)(1) of the Act, 21 U.S.C.section 360bbb-3(b)(1), unless the authorization is terminated  or revoked sooner.       Influenza A by PCR NEGATIVE NEGATIVE Final   Influenza B by PCR NEGATIVE NEGATIVE Final    Comment: (NOTE) The Xpert Xpress SARS-CoV-2/FLU/RSV plus assay is intended as an aid in the diagnosis of influenza from Nasopharyngeal swab specimens and should not be used as a sole basis for treatment. Nasal washings and aspirates are unacceptable for Xpert Xpress SARS-CoV-2/FLU/RSV testing.  Fact Sheet for Patients: EntrepreneurPulse.com.au  Fact Sheet for Healthcare Providers: IncredibleEmployment.be  This test is not yet approved or cleared by the [place] FDA and has been authorized for detection and/or diagnosis of SARS-CoV-2 by FDA under an Emergency Use Authorization (EUA). This EUA will remain in effect (meaning this test can be used) for the duration of the COVID-19 declaration under Section 564(b)(1) of the Act, 21 U.S.C. section 360bbb-3(b)(1), unless the authorization is terminated or revoked.  Performed at [facility], [address], [place], [place] [address]   MRSA Next Gen by PCR, Nasal     Status: None   Collection Time: 08/13/22  6:24 PM   Specimen: Nasal Mucosa; Nasal Swab  Result Value Ref Range Status   MRSA by PCR Next Gen NOT DETECTED NOT DETECTED Final    Comment: (NOTE) The  GeneXpert MRSA Assay (FDA approved for NASAL specimens only), is one component of a comprehensive MRSA colonization surveillance program. It is not intended to diagnose MRSA infection nor to guide or monitor treatment for MRSA infections. Test performance is not FDA approved in patients less than 52  years old. Performed at [facility], [address] [place], [place] [address]    Time coordinating discharge: 35 mins   SIGNED:  Irwin Brakeman, MD  [facility] 08/16/2022, 10:23 AM How to contact the [facility] Attending or Consulting provider [place] or covering provider during after hours [place], for this patient?  Check the care team in [facility] and look for a) attending/consulting TRH provider listed and b) the [facility] team listed Log into [email or web address] and use [place]'s universal password to access. If you do not have the password, please contact the hospital operator. Locate the [facility] provider you are looking for under [facility] and page to a number that you can be directly reached. If you still have difficulty reaching the provider, please page the [facility] (Director on Call) for the Hospitalists listed on amion for assistance.

## 2022-08-16 NOTE — Progress Notes (Signed)
Patient was educated on his medicines and about his condition regarding Atrial Fibrillation, educated him regarding his appointments and follow ups, took his IV out. Patient verbalize understanding of the information given. Grandson came to pick him up.

## 2022-08-16 NOTE — TOC Transition Note (Signed)
Transition of Care [facility]) - CM/SW Discharge Note   Patient Details  Name: Rodney Barnett MRN: [id number] Date of Birth: 10-Sep-1941  [facility]) CM/SW Contact:  Boneta Lucks, RN Phone Number: 08/16/2022, 10:28 AM   Clinical Narrative:   Patient discharging home today. TOC consulted to refer to [facility] for home hospice services. Referral sent and text office to make them aware.     Final next level of care: Home w Hospice Care Barriers to Discharge: Barriers Resolved   Patient Goals and CMS Choice Patient states their goals for this hospitalization and ongoing recovery are:: return home CMS Medicare.gov Compare Post Acute Care list provided to:: Patient Choice offered to / list presented to : Patient  Discharge Placement          Patient and family notified of of transfer: 08/16/22  Discharge Plan and Services In-house Referral: Clinical Social Work Discharge Planning Services: CM Consult               [place] Agency: [facility] Date [place]: 08/16/22 Time [place]: 1027

## 2022-08-16 NOTE — Discharge Instructions (Signed)
PLEASE CALL HOSPICE NURSE FIRST BEFORE CALLING 911 OR GOING TO EMERGENCY ROOM

## 2022-08-19 ENCOUNTER — Ambulatory Visit (INDEPENDENT_AMBULATORY_CARE_PROVIDER_SITE_OTHER): Payer: Medicare Other | Admitting: Family Medicine

## 2022-08-19 ENCOUNTER — Other Ambulatory Visit: Payer: Self-pay | Admitting: Family Medicine

## 2022-08-19 VITALS — BP 100/68 | HR 88 | Temp 97.4°F | Wt 197.0 lb

## 2022-08-19 DIAGNOSIS — G8929 Other chronic pain: Secondary | ICD-10-CM

## 2022-08-19 DIAGNOSIS — I10 Essential (primary) hypertension: Secondary | ICD-10-CM

## 2022-08-19 DIAGNOSIS — I4891 Unspecified atrial fibrillation: Secondary | ICD-10-CM | POA: Diagnosis not present

## 2022-08-19 DIAGNOSIS — R918 Other nonspecific abnormal finding of lung field: Secondary | ICD-10-CM | POA: Diagnosis not present

## 2022-08-19 DIAGNOSIS — I5021 Acute systolic (congestive) heart failure: Secondary | ICD-10-CM | POA: Diagnosis not present

## 2022-08-19 DIAGNOSIS — M5442 Lumbago with sciatica, left side: Secondary | ICD-10-CM

## 2022-08-19 MED ORDER — HYDROCODONE-ACETAMINOPHEN 10-325 MG PO TABS: 1.0000 | ORAL_TABLET | Freq: Four times a day (QID) | ORAL | 0 refills | Status: DC | PRN

## 2022-08-19 MED ORDER — FLUTICASONE PROPIONATE 50 MCG/ACT NA SUSP: 2.0000 | Freq: Every day | NASAL | 6 refills | Status: DC

## 2022-08-19 NOTE — Progress Notes (Signed)
Established Patient Office Visit  Subjective   Patient ID: Rodney Barnett, male    DOB: 1940-12-27  Age: 81 y.o. MRN: [id number]  No chief complaint on file.   HPI   Mr. Pizzini has history of CAD, hypertension, hyperlipidemia, iron deficiency anemia which has been worked up by GI.  Back in June he had presented to ER in [facility] with some generalized weakness and had MRI of the brain which was negative for CVA and there was concern for possible UTI.  He has history of CAD with CABG back in 1994 and also history of bladder cancer and history of AVMs likely accounting for his iron deficiency anemia.  He presented recently to the ER on the ninth with some increasing shortness of breath over the past few days.  He also had substantial weight loss since he was last seen here in May.  In the ED was noted to be in atrial fibrillation with rapid trickle response which was new.  Hemoglobin 10.7.  CT scan showed new mass right lung felt to probably be primary pulmonary lesion.  Regarding his atrial fibrillation he had increased heart rate which did not respond to IV metoprolol was admitted for Cardizem drip.  CHA2DS2-VASc score of 2.  Patient was started on Eliquis.  Discharged on metoprolol.  Potassium normal at discharge and TSH was normal.  He had some hypotension and was told to stop Imdur but apparently is still taking this.  Was also noted to have elevated BNP and volume overload.  Echocardiogram revealed reduced ejection fraction 30 to 35%.  Taken off diltiazem and started on metoprolol 25 mg twice daily.  Also taking Lasix 40 mg daily along with potassium supplement  Patient has longstanding history of smoking.  Right lung mass noted on CT.  Recommendation was for biopsy and further evaluation but patient declined any further work-up whatsoever.  There is also concern for left thyroid mass which she declined further work-up.  Patient declined any further evaluation including oncology.  He is  a DNR and agreed to discharge home with hospice care.  He has chronic back pain and takes hydrocodone for that.  Pain stable.  He states he has no dyspnea at this time.  Denies lightheadedness.  He states his appetite has improved and he states he has gained 7 pounds recently.  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   CHF (congestive heart failure) (HCC)    Chronic back pain    Chronic rhinitis    Coronary atherosclerosis of native coronary artery    Multivessel, LVEF 55-60%, basal inferior hypokinesis, known graft disease   Diverticulosis    Erectile dysfunction    Essential hypertension    History of bladder cancer    Insomnia    Internal hemorrhoids    Lipoma    Mixed hyperlipidemia    Peripheral neuropathy    Tubular adenoma of colon    Past Surgical History:  Procedure Laterality Date   COLONOSCOPY WITH PROPOFOL N/A 09/01/2021   Procedure: COLONOSCOPY WITH PROPOFOL;  Surgeon: Lavena Bullion, DO;  Location: [facility];  Service: Gastroenterology;  Laterality: N/A;   CORONARY ARTERY BYPASS GRAFT  1994   LIMA to LAD, SVG to RCA, SVG to diagonal   ENDOSCOPIC MUCOSAL RESECTION  09/01/2021   Procedure: ENDOSCOPIC MUCOSAL RESECTION;  Surgeon: Lavena Bullion, DO;  Location: [place];  Service: Gastroenterology;;   ESOPHAGOGASTRODUODENOSCOPY (EGD) WITH  PROPOFOL N/A 09/01/2021   Procedure: ESOPHAGOGASTRODUODENOSCOPY (EGD) WITH PROPOFOL;  Surgeon: Lavena Bullion, DO;  Location: [place];  Service: Gastroenterology;  Laterality: N/A;   HEMOSTASIS CLIP PLACEMENT  09/01/2021   Procedure: HEMOSTASIS CLIP PLACEMENT;  Surgeon: Lavena Bullion, DO;  Location: [place];  Service: Gastroenterology;;   HOT HEMOSTASIS N/A 09/01/2021   Procedure: HOT HEMOSTASIS (ARGON PLASMA COAGULATION/BICAP);  Surgeon: Lavena Bullion, DO;  Location: [facility] ENDOSCOPY;  Service: Gastroenterology;  Laterality: N/A;   POLYPECTOMY  09/01/2021    Procedure: POLYPECTOMY;  Surgeon: Lavena Bullion, DO;  Location: [place] [facility]OSCOPY;  Service: Gastroenterology;;   SUBMUCOSAL LIFTING INJECTION  09/01/2021   Procedure: SUBMUCOSAL LIFTING INJECTION;  Surgeon: Lavena Bullion, DO;  Location: [facility];  Service: Gastroenterology;;   TONSILLECTOMY     TRANSURETHRAL RESECTION OF BLADDER TUMOR N/A 09/09/2018   Procedure: TRANSURETHRAL RESECTION OF BLADDER TUMOR (TURBT) WITH INSTILLATION OF POST OPERATIVE CHEMOTHERAPY;  Surgeon: Kathie Rhodes, MD;  Location: [facility];  Service: Urology;  Laterality: N/A;    reports that he quit smoking about 29 years ago. His smoking use included cigarettes. He started smoking about 67 years ago. He has a 120.00 pack-year smoking history. He has never used smokeless tobacco. He reports that he does not drink alcohol and does not use drugs. family history includes Cancer in his brother; Hypertension in an other family member. Allergies  Allergen Reactions   Lipitor [Atorvastatin] Other (See Comments)    myalgia    Review of Systems  Constitutional:  Positive for weight loss. Negative for chills and fever.  Respiratory:  Positive for cough and hemoptysis.   Cardiovascular:  Negative for chest pain.  Gastrointestinal:  Negative for abdominal pain.  Genitourinary:  Negative for dysuria.  Neurological:  Negative for dizziness, focal weakness and headaches.      Objective:     BP 100/68 (BP Location: Left Arm, Patient Position: Sitting, Cuff Size: Normal)   Pulse 88   Temp (!) 97.4 F (36.3 C) (Oral)   Wt 197 lb (89.4 kg)   SpO2 97%   BMI 25.99 kg/m  BP Readings from Last 3 Encounters:  08/19/22 100/68  08/16/22 121/63  02/18/22 102/60   Wt Readings from Last 3 Encounters:  08/19/22 197 lb (89.4 kg)  08/16/22 201 lb 4.5 oz (91.3 kg)  02/18/22 231 lb 14.4 oz (105.2 kg)      Physical Exam Vitals reviewed.  Constitutional:      Comments: Patient is much thinner than the last time I saw him  but is in no acute distress.  HENT:     Head: Normocephalic and atraumatic.  Cardiovascular:     Comments: Irregular rhythm but rate controlled with rate around 80 Pulmonary:     Comments: No wheezes.  He has no increased work of breathing at rest.  Pulse oximetry 97% room air.  Decreased breath sounds right lung base compared to the left Musculoskeletal:     Cervical back: Neck supple.     Right lower leg: No edema.     Left lower leg: No edema.  Neurological:     General: No focal deficit present.      No results found for any visits on 08/19/22.  Last CBC Lab Results  Component Value Date   WBC 5.8 08/16/2022   HGB 10.6 (L) 08/16/2022   HCT 33.7 (L) 08/16/2022   MCV 92.8 08/16/2022   MCH 29.2 08/16/2022   RDW 15.3 08/16/2022   PLT 160 08/16/2022  Last metabolic panel Lab Results  Component Value Date   GLUCOSE 103 (H) 08/16/2022   NA 133 (L) 08/16/2022   K 3.5 08/16/2022   CL 99 08/16/2022   CO2 26 08/16/2022   BUN 22 08/16/2022   CREATININE 1.34 (H) 08/16/2022   GFRNONAA 54 (L) 08/16/2022   CALCIUM 8.2 (L) 08/16/2022   PHOS 3.0 09/01/2021   PROT 7.2 08/27/2021   ALBUMIN 3.1 (L) 09/01/2021   BILITOT 0.8 08/27/2021   ALKPHOS 58 08/27/2021   AST 14 (L) 08/27/2021   ALT 10 08/27/2021   ANIONGAP 8 08/16/2022   Last lipids Lab Results  Component Value Date   CHOL 102 08/20/2021   HDL 35.90 (L) 08/20/2021   LDLCALC 39 08/20/2021   LDLDIRECT 114.8 06/07/2012   TRIG 132.0 08/20/2021   CHOLHDL 3 08/20/2021   Last thyroid functions Lab Results  Component Value Date   TSH 2.607 08/13/2022      The ASCVD Risk score (Arnett DK, et al., 2019) failed to calculate for the following reasons:   The 2019 ASCVD risk score is only valid for ages 99 to 27    Assessment & Plan:   #1 recent hospital admission for dyspnea.  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  Patient relates history only recently of increased cough.  Substantial weight loss.  Very likely primary lung tumor.  Patient refuses further evaluation and refuses oncology referral.  Did agree to hospice and has already had hospice come out.  He does have DNR and also living will.  #3 atrial fibrillation with rapid ventricular response.  Currently rate controlled on metoprolol.  Patient on Eliquis 5 mg twice daily  #4 heart failure with reduced ejection fraction.  Patient cannot tolerate lisinopril secondary to hypotension.  Currently on metoprolol 25 mg twice daily and tolerating fairly well.  #5 chronic back pain.  Patient requesting refill of hydrocodone.  He for years has taken 10 mg 1/2 tablet twice daily.  Refill was sent.  He has no history of misuse with pain medication.  We had a long discussion regarding goals of care.  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  We explained if he has any increasing pain to be in touch and we can gradually increase his medication if necessary.  Does have hospice referral as above.  #6 nasal congestion.  Recommend over-the-counter Flonase 2 sprays per nostril once daily  #7 hypotension.  Patient has no orthostatic symptoms.  Blood pressure today 100/68 and standing obtained similar reading.  We have recommended he stop Imdur which he apparently was still taking.  He has already stopped the lisinopril  -Set up 1 month follow-up and sooner as needed   Return in about 1 month (around 09/18/2022).    Carolann Littler, MD

## 2022-08-19 NOTE — Patient Instructions (Addendum)
HOLD the Isosorbide 30 mg  Please let me know if I can do anything to help you.

## 2022-08-21 ENCOUNTER — Ambulatory Visit: Payer: Medicare Other | Admitting: Family Medicine

## 2022-08-25 ENCOUNTER — Telehealth: Payer: Self-pay | Admitting: Family Medicine

## 2022-08-25 NOTE — Telephone Encounter (Signed)
Left message for patient to call back and schedule Medicare Annual Wellness Visit (AWV) either virtually or in office. Left  my Herbie Drape number [phone]   Last AWV 06/27/21 please schedule with Nurse Health Adviser   45 min for awv-i and in office appointments 30 min for awv-s  phone/virtual appointments

## 2022-09-09 ENCOUNTER — Telehealth: Payer: Self-pay | Admitting: Family Medicine

## 2022-09-09 NOTE — Telephone Encounter (Signed)
Requesting a refill of the following: pantoprazole (PROTONIX) 40 MG tablet potassium chloride SA (KLOR-CON M) 20 MEQ tablet furosemide (LASIX) 40 MG tablet   LOV:  08/19/22  [facility] [place], [place] Phone: [phone]  Fax: [phone]

## 2022-09-10 MED ORDER — FUROSEMIDE 40 MG PO TABS: 40.0000 mg | ORAL_TABLET | Freq: Every day | ORAL | 1 refills | Status: DC

## 2022-09-10 MED ORDER — POTASSIUM CHLORIDE CRYS ER 20 MEQ PO TBCR: 20.0000 meq | EXTENDED_RELEASE_TABLET | Freq: Every day | ORAL | 1 refills | Status: DC

## 2022-09-10 MED ORDER — PANTOPRAZOLE SODIUM 40 MG PO TBEC: 40.0000 mg | DELAYED_RELEASE_TABLET | Freq: Every day | ORAL | 1 refills | Status: DC

## 2022-09-10 NOTE — Telephone Encounter (Signed)
Rx sent 

## 2022-09-15 ENCOUNTER — Other Ambulatory Visit: Payer: Self-pay | Admitting: Family Medicine

## 2022-09-17 ENCOUNTER — Other Ambulatory Visit: Payer: Self-pay | Admitting: Family Medicine

## 2022-09-21 ENCOUNTER — Emergency Department (HOSPITAL_COMMUNITY)

## 2022-09-21 ENCOUNTER — Other Ambulatory Visit: Payer: Self-pay

## 2022-09-21 ENCOUNTER — Inpatient Hospital Stay (HOSPITAL_COMMUNITY)
Admission: EM | Admit: 2022-09-21 | Discharge: 2022-09-25 | DRG: 640 | Disposition: A | Discharge status: Hospice | Attending: Internal Medicine | Admitting: Internal Medicine

## 2022-09-21 ENCOUNTER — Telehealth: Payer: Self-pay | Admitting: Family Medicine

## 2022-09-21 ENCOUNTER — Encounter (HOSPITAL_COMMUNITY): Payer: Self-pay

## 2022-09-21 DIAGNOSIS — W19XXXA Unspecified fall, initial encounter: Secondary | ICD-10-CM

## 2022-09-21 DIAGNOSIS — I4891 Unspecified atrial fibrillation: Secondary | ICD-10-CM | POA: Diagnosis present

## 2022-09-21 DIAGNOSIS — M25551 Pain in right hip: Secondary | ICD-10-CM | POA: Diagnosis present

## 2022-09-21 DIAGNOSIS — I9589 Other hypotension: Secondary | ICD-10-CM | POA: Diagnosis present

## 2022-09-21 DIAGNOSIS — R54 Age-related physical debility: Secondary | ICD-10-CM | POA: Diagnosis present

## 2022-09-21 DIAGNOSIS — W1812XA Fall from or off toilet with subsequent striking against object, initial encounter: Secondary | ICD-10-CM | POA: Diagnosis present

## 2022-09-21 DIAGNOSIS — E785 Hyperlipidemia, unspecified: Secondary | ICD-10-CM | POA: Diagnosis present

## 2022-09-21 DIAGNOSIS — N1831 Chronic kidney disease, stage 3a: Secondary | ICD-10-CM | POA: Diagnosis present

## 2022-09-21 DIAGNOSIS — I130 Hypertensive heart and chronic kidney disease with heart failure and stage 1 through stage 4 chronic kidney disease, or unspecified chronic kidney disease: Secondary | ICD-10-CM | POA: Diagnosis present

## 2022-09-21 DIAGNOSIS — I10 Essential (primary) hypertension: Secondary | ICD-10-CM | POA: Diagnosis present

## 2022-09-21 DIAGNOSIS — Z6823 Body mass index (BMI) 23.0-23.9, adult: Secondary | ICD-10-CM

## 2022-09-21 DIAGNOSIS — M25561 Pain in right knee: Secondary | ICD-10-CM | POA: Diagnosis present

## 2022-09-21 DIAGNOSIS — Y92012 Bathroom of single-family (private) house as the place of occurrence of the external cause: Secondary | ICD-10-CM

## 2022-09-21 DIAGNOSIS — R000 Tachycardia, unspecified: Secondary | ICD-10-CM | POA: Diagnosis present

## 2022-09-21 DIAGNOSIS — Z79899 Other long term (current) drug therapy: Secondary | ICD-10-CM

## 2022-09-21 DIAGNOSIS — E875 Hyperkalemia: Principal | ICD-10-CM

## 2022-09-21 DIAGNOSIS — E861 Hypovolemia: Secondary | ICD-10-CM

## 2022-09-21 DIAGNOSIS — I5033 Acute on chronic diastolic (congestive) heart failure: Secondary | ICD-10-CM | POA: Diagnosis not present

## 2022-09-21 DIAGNOSIS — Z9181 History of falling: Secondary | ICD-10-CM

## 2022-09-21 DIAGNOSIS — Z888 Allergy status to other drugs, medicaments and biological substances status: Secondary | ICD-10-CM

## 2022-09-21 DIAGNOSIS — Z87891 Personal history of nicotine dependence: Secondary | ICD-10-CM

## 2022-09-21 DIAGNOSIS — G8929 Other chronic pain: Secondary | ICD-10-CM | POA: Diagnosis present

## 2022-09-21 DIAGNOSIS — J320 Chronic maxillary sinusitis: Secondary | ICD-10-CM | POA: Diagnosis present

## 2022-09-21 DIAGNOSIS — Z8249 Family history of ischemic heart disease and other diseases of the circulatory system: Secondary | ICD-10-CM

## 2022-09-21 DIAGNOSIS — I2510 Atherosclerotic heart disease of native coronary artery without angina pectoris: Secondary | ICD-10-CM | POA: Diagnosis present

## 2022-09-21 DIAGNOSIS — G629 Polyneuropathy, unspecified: Secondary | ICD-10-CM | POA: Diagnosis present

## 2022-09-21 DIAGNOSIS — R55 Syncope and collapse: Secondary | ICD-10-CM | POA: Diagnosis present

## 2022-09-21 DIAGNOSIS — E871 Hypo-osmolality and hyponatremia: Secondary | ICD-10-CM | POA: Diagnosis present

## 2022-09-21 DIAGNOSIS — J449 Chronic obstructive pulmonary disease, unspecified: Secondary | ICD-10-CM | POA: Diagnosis present

## 2022-09-21 DIAGNOSIS — K648 Other hemorrhoids: Secondary | ICD-10-CM | POA: Diagnosis present

## 2022-09-21 DIAGNOSIS — Z8051 Family history of malignant neoplasm of kidney: Secondary | ICD-10-CM

## 2022-09-21 DIAGNOSIS — Z66 Do not resuscitate: Secondary | ICD-10-CM | POA: Diagnosis present

## 2022-09-21 DIAGNOSIS — M549 Dorsalgia, unspecified: Secondary | ICD-10-CM | POA: Diagnosis present

## 2022-09-21 DIAGNOSIS — E782 Mixed hyperlipidemia: Secondary | ICD-10-CM | POA: Diagnosis present

## 2022-09-21 DIAGNOSIS — Z8551 Personal history of malignant neoplasm of bladder: Secondary | ICD-10-CM

## 2022-09-21 DIAGNOSIS — Z86010 Personal history of colonic polyps: Secondary | ICD-10-CM

## 2022-09-21 DIAGNOSIS — N179 Acute kidney failure, unspecified: Secondary | ICD-10-CM

## 2022-09-21 DIAGNOSIS — Z602 Problems related to living alone: Secondary | ICD-10-CM | POA: Diagnosis present

## 2022-09-21 DIAGNOSIS — D649 Anemia, unspecified: Secondary | ICD-10-CM | POA: Diagnosis present

## 2022-09-21 DIAGNOSIS — Z7901 Long term (current) use of anticoagulants: Secondary | ICD-10-CM

## 2022-09-21 DIAGNOSIS — Z1152 Encounter for screening for COVID-19: Secondary | ICD-10-CM

## 2022-09-21 DIAGNOSIS — S0083XA Contusion of other part of head, initial encounter: Secondary | ICD-10-CM

## 2022-09-21 DIAGNOSIS — E041 Nontoxic single thyroid nodule: Secondary | ICD-10-CM | POA: Diagnosis present

## 2022-09-21 DIAGNOSIS — R251 Tremor, unspecified: Secondary | ICD-10-CM | POA: Diagnosis present

## 2022-09-21 DIAGNOSIS — I25810 Atherosclerosis of coronary artery bypass graft(s) without angina pectoris: Secondary | ICD-10-CM | POA: Diagnosis present

## 2022-09-21 LAB — CBC WITH DIFFERENTIAL/PLATELET
Abs Immature Granulocytes: 0.05 10*3/uL (ref 0.00–0.07)
Basophils Absolute: 0 10*3/uL (ref 0.0–0.1)
Basophils Relative: 1 %
Eosinophils Absolute: 0.1 10*3/uL (ref 0.0–0.5)
Eosinophils Relative: 1 %
HCT: 32.9 % — ABNORMAL LOW (ref 39.0–52.0)
Hemoglobin: 10 g/dL — ABNORMAL LOW (ref 13.0–17.0)
Immature Granulocytes: 1 %
Lymphocytes Relative: 9 %
Lymphs Abs: 0.7 10*3/uL (ref 0.7–4.0)
MCH: 28.9 pg (ref 26.0–34.0)
MCHC: 30.4 g/dL (ref 30.0–36.0)
MCV: 95.1 fL (ref 80.0–100.0)
Monocytes Absolute: 0.4 10*3/uL (ref 0.1–1.0)
Monocytes Relative: 5 %
Neutro Abs: 6.7 10*3/uL (ref 1.7–7.7)
Neutrophils Relative %: 83 %
Platelets: 184 10*3/uL (ref 150–400)
RBC: 3.46 MIL/uL — ABNORMAL LOW (ref 4.22–5.81)
RDW: 16.5 % — ABNORMAL HIGH (ref 11.5–15.5)
WBC: 8 10*3/uL (ref 4.0–10.5)
nRBC: 0 % (ref 0.0–0.2)

## 2022-09-21 LAB — COMPREHENSIVE METABOLIC PANEL
ALT: 10 U/L (ref 0–44)
AST: 26 U/L (ref 15–41)
Albumin: 2.3 g/dL — ABNORMAL LOW (ref 3.5–5.0)
Alkaline Phosphatase: 73 U/L (ref 38–126)
Anion gap: 7 (ref 5–15)
BUN: 72 mg/dL — ABNORMAL HIGH (ref 8–23)
CO2: 20 mmol/L — ABNORMAL LOW (ref 22–32)
Calcium: 8.1 mg/dL — ABNORMAL LOW (ref 8.9–10.3)
Chloride: 106 mmol/L (ref 98–111)
Creatinine, Ser: 4.24 mg/dL — ABNORMAL HIGH (ref 0.61–1.24)
GFR, Estimated: 13 mL/min — ABNORMAL LOW (ref >60)
Glucose, Bld: 122 mg/dL — ABNORMAL HIGH (ref 70–99)
Potassium: 6.4 mmol/L (ref 3.5–5.1)
Sodium: 133 mmol/L — ABNORMAL LOW (ref 135–145)
Total Bilirubin: 0.8 mg/dL (ref 0.3–1.2)
Total Protein: 6.5 g/dL (ref 6.5–8.1)

## 2022-09-21 LAB — TROPONIN I (HIGH SENSITIVITY): Troponin I (High Sensitivity): 6 ng/L (ref <18)

## 2022-09-21 LAB — BRAIN NATRIURETIC PEPTIDE: B Natriuretic Peptide: 158 pg/mL — ABNORMAL HIGH (ref 0.0–100.0)

## 2022-09-21 MED ORDER — SODIUM ZIRCONIUM CYCLOSILICATE 5 G PO PACK
10.0000 g | PACK | Freq: Once | ORAL | Status: AC
  Administered 2022-09-21: 10 g via ORAL
  Filled 2022-09-21 (×2): qty 2

## 2022-09-21 MED ORDER — DEXTROSE 50 % IV SOLN
1.0000 | Freq: Once | INTRAVENOUS | Status: AC
  Administered 2022-09-21: 50 mL via INTRAVENOUS
  Filled 2022-09-21: qty 50

## 2022-09-21 MED ORDER — SODIUM CHLORIDE 0.9 % IV BOLUS
500.0000 mL | Freq: Once | INTRAVENOUS | Status: AC
  Administered 2022-09-21: 500 mL via INTRAVENOUS

## 2022-09-21 MED ORDER — DEXTROSE 10 % IV SOLN: Freq: Once | INTRAVENOUS | Status: DC

## 2022-09-21 MED ORDER — INSULIN ASPART 100 UNIT/ML IV SOLN
5.0000 [IU] | Freq: Once | INTRAVENOUS | Status: AC
  Administered 2022-09-21: 5 [IU] via INTRAVENOUS

## 2022-09-21 NOTE — Telephone Encounter (Signed)
I spoke with patient to schedule AWV.  Patient stated he doesn't feel well.  He cannot get up to go to bathroom.  I transferred patient to triage [phone] I spoke with [facility] @ triage before transferring patinet

## 2022-09-21 NOTE — ED Provider Notes (Signed)
[facility] EMERGENCY DEPARTMENT Provider Note   CSN: [id number] Arrival date & time: 09/21/22  2132     History {Add pertinent medical, surgical, social history, OB history to HPI:1} Chief Complaint  Patient presents with   Fall   Hypotension    Rodney Barnett is a 81 y.o. male.  He is here for evaluation of injuries after a fall at home.  He said he was using the commode and when he bent over to stand up he fell forward and hit his head.  He does not think he lost consciousness.  He is complaining of some pain on the top of his head.  He also noticed some pain in his right hip and knee after a fall a few weeks ago.  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  He also had a thyroid mass.  He elected to be discharged and hospice is involved.  Blood pressures were low on discharge.  Patient states they remained low.  He denies any chest pain shortness of breath or abdominal pain at this time.  The history is provided by the patient and a relative.  Fall This is a recurrent problem. The problem has not changed since onset.Associated symptoms include headaches. Pertinent negatives include no chest pain, no abdominal pain and no shortness of breath. Nothing aggravates the symptoms. Nothing relieves the symptoms. He has tried nothing for the symptoms. The treatment provided no relief.       Home Medications Prior to Admission medications   Medication Sig Start Date End Date Taking? Authorizing Provider  apixaban (ELIQUIS) 5 MG TABS tablet Take 1 tablet (5 mg total) by mouth 2 (two) times daily. 08/16/22   Johnson, Clanford L, MD  fluticasone (FLONASE) 50 MCG/ACT nasal spray Place 2 sprays into both nostrils daily. 08/19/22   Burchette, Alinda Sierras, MD  furosemide (LASIX) 40 MG tablet Take 1 tablet (40 mg total) by mouth daily. 09/10/22 09/10/23  Burchette, Alinda Sierras, MD  gabapentin (NEURONTIN) 300 MG capsule TAKE (2) CAPSULES BY MOUTH THREE TIMES DAILY. 09/18/22    Burchette, Alinda Sierras, MD  HYDROcodone-acetaminophen (NORCO) 10-325 MG tablet Take 1 tablet by mouth every 6 (six) hours as needed (using for sleep). 08/19/22   Burchette, Alinda Sierras, MD  Iron, Ferrous Sulfate, 325 (65 Fe) MG TABS Take 1 tablet by mouth daily. Patient not taking: Reported on 08/19/2022 10/17/21   Thornton Park, MD  isosorbide mononitrate (IMDUR) 30 MG 24 hr tablet TAKE 1 TABLET ONCE DAILY. 09/17/22   Burchette, Alinda Sierras, MD  metoprolol tartrate (LOPRESSOR) 25 MG tablet Take 1 tablet (25 mg total) by mouth 2 (two) times daily. 08/16/22   Johnson, Clanford L, MD  nitroGLYCERIN (NITROSTAT) 0.4 MG SL tablet Place 1 tablet (0.4 mg total) under the tongue every 5 (five) minutes x 3 doses as needed for chest pain (If no relief after 3rd dose, proceed to the ED for an evaluation or call 911). 08/20/21   Burchette, Alinda Sierras, MD  Omega-3 Fatty Acids (FISH OIL) 1000 MG CPDR Take 1,000 mg by mouth daily.    [provider]  pantoprazole (PROTONIX) 40 MG tablet Take 1 tablet (40 mg total) by mouth daily. 09/10/22   Burchette, Alinda Sierras, MD  potassium chloride SA (KLOR-CON M) 20 MEQ tablet Take 1 tablet (20 mEq total) by mouth daily. 09/10/22   Burchette, Alinda Sierras, MD  rosuvastatin (CRESTOR) 20 MG tablet TAKE 1 TABLET ONCE DAILY.  09/16/22   Burchette, Alinda Sierras, MD      Allergies    Lipitor [atorvastatin]    Review of Systems   Review of Systems  Constitutional:  Negative for fever.  Eyes:  Negative for visual disturbance.  Respiratory:  Negative for shortness of breath.   Cardiovascular:  Negative for chest pain.  Gastrointestinal:  Negative for abdominal pain.  Musculoskeletal:  Positive for back pain (chronic). Negative for neck pain.  Skin:  Positive for wound.  Neurological:  Positive for headaches.    Physical Exam Updated Vital Signs BP (!) 80/50   Pulse 90   Resp (!) 23   SpO2 100%  Physical Exam Vitals and nursing note reviewed.  Constitutional:      General: He is not  in acute distress.    Appearance: Normal appearance. He is well-developed.  HENT:     Head: Normocephalic.     Comments: Patient has an abrasion to his forehead.  He has some older ecchymosis under his left cheek. Eyes:     Conjunctiva/sclera: Conjunctivae normal.  Cardiovascular:     Rate and Rhythm: Tachycardia present. Rhythm irregular.     Heart sounds: No murmur heard. Pulmonary:     Effort: Pulmonary effort is normal. No respiratory distress.     Breath sounds: Normal breath sounds.  Abdominal:     Palpations: Abdomen is soft.     Tenderness: There is no abdominal tenderness. There is no guarding or rebound.  Musculoskeletal:        General: Tenderness present.     Cervical back: Neck supple. No tenderness.     Right lower leg: Edema present.     Left lower leg: Edema present.     Comments: Patient has some vague tenderness to his right hip and right knee.  Skin:    General: Skin is warm and dry.     Capillary Refill: Capillary refill takes less than 2 seconds.  Neurological:     General: No focal deficit present.     Mental Status: He is alert.     Motor: No weakness.     ED Results / Procedures / Treatments   Labs (all labs ordered are listed, but only abnormal results are displayed) Labs Reviewed - No data to display  EKG EKG Interpretation  Date/Time:  Monday September 21 2022 21:42:40 EST Ventricular Rate:  117 PR Interval:    QRS Duration: 101 QT Interval:  294 QTC Calculation: 389 R Axis:   -3 Text Interpretation: Atrial fibrillation Paired ventricular premature complexes Anteroseptal infarct, old No significant change since prior 11/23 Confirmed by Aletta Edouard [phone]) on 09/21/2022 10:14:36 PM  Radiology No results found.  Procedures Procedures  {Document cardiac monitor, telemetry assessment procedure when appropriate:1}  Medications Ordered in ED Medications - No data to display  ED Course/ Medical Decision Making/ A&P                            Medical Decision Making Amount and/or Complexity of Data Reviewed Labs: ordered. Radiology: ordered.   This patient complains of ***; this involves an extensive number of treatment Options and is a complaint that carries with it a high risk of complications and morbidity. The differential includes ***  I ordered, reviewed and interpreted labs, which included *** I ordered medication *** and reviewed PMP when indicated. I ordered imaging studies which included *** and I independently    visualized and interpreted imaging  which showed *** Additional history obtained from *** Previous records obtained and reviewed *** I consulted *** and discussed lab and imaging findings and discussed disposition.  Cardiac monitoring reviewed, *** Social determinants considered, *** Critical Interventions: ***  After the interventions stated above, I reevaluated the patient and found *** Admission and further testing considered, ***   {Document critical care time when appropriate:1} {Document review of labs and clinical decision tools ie heart score, Chads2Vasc2 etc:1}  {Document your independent review of radiology images, and any outside records:1} {Document your discussion with family members, caretakers, and with consultants:1} {Document social determinants of health affecting pt's care:1} {Document your decision making why or why not admission, treatments were needed:1} Final Clinical Impression(s) / ED Diagnoses Final diagnoses:  None    Rx / DC Orders ED Discharge Orders     None

## 2022-09-21 NOTE — ED Triage Notes (Addendum)
Pt presents to ED via [facility] after a fall this evening. Pt with laceration to L side of forehead. Pt is on blood thinners. Not complaining of any pain. Pt BP in the 60s upon EMS assessment. IV started and 500 bolus going at this time. Pt with history of A-fib and showing A-fib at 110-120 on monitor.

## 2022-09-22 ENCOUNTER — Inpatient Hospital Stay (HOSPITAL_COMMUNITY)

## 2022-09-22 ENCOUNTER — Encounter (HOSPITAL_COMMUNITY): Payer: Self-pay | Admitting: Internal Medicine

## 2022-09-22 DIAGNOSIS — E861 Hypovolemia: Secondary | ICD-10-CM | POA: Diagnosis present

## 2022-09-22 DIAGNOSIS — Z8249 Family history of ischemic heart disease and other diseases of the circulatory system: Secondary | ICD-10-CM | POA: Diagnosis not present

## 2022-09-22 DIAGNOSIS — E041 Nontoxic single thyroid nodule: Secondary | ICD-10-CM | POA: Diagnosis present

## 2022-09-22 DIAGNOSIS — J449 Chronic obstructive pulmonary disease, unspecified: Secondary | ICD-10-CM | POA: Diagnosis present

## 2022-09-22 DIAGNOSIS — I130 Hypertensive heart and chronic kidney disease with heart failure and stage 1 through stage 4 chronic kidney disease, or unspecified chronic kidney disease: Secondary | ICD-10-CM | POA: Diagnosis present

## 2022-09-22 DIAGNOSIS — E871 Hypo-osmolality and hyponatremia: Secondary | ICD-10-CM

## 2022-09-22 DIAGNOSIS — J320 Chronic maxillary sinusitis: Secondary | ICD-10-CM | POA: Diagnosis present

## 2022-09-22 DIAGNOSIS — I4891 Unspecified atrial fibrillation: Secondary | ICD-10-CM | POA: Diagnosis present

## 2022-09-22 DIAGNOSIS — I2510 Atherosclerotic heart disease of native coronary artery without angina pectoris: Secondary | ICD-10-CM

## 2022-09-22 DIAGNOSIS — I25810 Atherosclerosis of coronary artery bypass graft(s) without angina pectoris: Secondary | ICD-10-CM | POA: Diagnosis present

## 2022-09-22 DIAGNOSIS — Z8551 Personal history of malignant neoplasm of bladder: Secondary | ICD-10-CM | POA: Diagnosis not present

## 2022-09-22 DIAGNOSIS — D649 Anemia, unspecified: Secondary | ICD-10-CM | POA: Diagnosis present

## 2022-09-22 DIAGNOSIS — Z87891 Personal history of nicotine dependence: Secondary | ICD-10-CM | POA: Diagnosis not present

## 2022-09-22 DIAGNOSIS — S0083XA Contusion of other part of head, initial encounter: Secondary | ICD-10-CM | POA: Diagnosis present

## 2022-09-22 DIAGNOSIS — I5033 Acute on chronic diastolic (congestive) heart failure: Secondary | ICD-10-CM | POA: Diagnosis not present

## 2022-09-22 DIAGNOSIS — G8929 Other chronic pain: Secondary | ICD-10-CM | POA: Diagnosis present

## 2022-09-22 DIAGNOSIS — N1831 Chronic kidney disease, stage 3a: Secondary | ICD-10-CM | POA: Diagnosis present

## 2022-09-22 DIAGNOSIS — E875 Hyperkalemia: Secondary | ICD-10-CM | POA: Diagnosis present

## 2022-09-22 DIAGNOSIS — I10 Essential (primary) hypertension: Secondary | ICD-10-CM | POA: Diagnosis not present

## 2022-09-22 DIAGNOSIS — Z66 Do not resuscitate: Secondary | ICD-10-CM | POA: Diagnosis present

## 2022-09-22 DIAGNOSIS — E782 Mixed hyperlipidemia: Secondary | ICD-10-CM | POA: Diagnosis present

## 2022-09-22 DIAGNOSIS — N179 Acute kidney failure, unspecified: Secondary | ICD-10-CM

## 2022-09-22 DIAGNOSIS — Z1152 Encounter for screening for COVID-19: Secondary | ICD-10-CM | POA: Diagnosis not present

## 2022-09-22 DIAGNOSIS — Y92012 Bathroom of single-family (private) house as the place of occurrence of the external cause: Secondary | ICD-10-CM | POA: Diagnosis not present

## 2022-09-22 DIAGNOSIS — Z8051 Family history of malignant neoplasm of kidney: Secondary | ICD-10-CM | POA: Diagnosis not present

## 2022-09-22 DIAGNOSIS — Z86010 Personal history of colonic polyps: Secondary | ICD-10-CM | POA: Diagnosis not present

## 2022-09-22 DIAGNOSIS — I9589 Other hypotension: Secondary | ICD-10-CM | POA: Diagnosis present

## 2022-09-22 DIAGNOSIS — W1812XA Fall from or off toilet with subsequent striking against object, initial encounter: Secondary | ICD-10-CM | POA: Diagnosis present

## 2022-09-22 LAB — ECHOCARDIOGRAM COMPLETE
AR max vel: 1.4 cm2
AV Area VTI: 1.53 cm2
AV Area mean vel: 1.37 cm2
AV Mean grad: 7 mmHg
AV Peak grad: 14.4 mmHg
Ao pk vel: 1.9 m/s
Area-P 1/2: 9.98 cm2
MV VTI: 2.82 cm2
S' Lateral: 2.8 cm
Weight: 3203.2 oz

## 2022-09-22 LAB — BASIC METABOLIC PANEL
Anion gap: 7 (ref 5–15)
Anion gap: 8 (ref 5–15)
Anion gap: 6 (ref 5–15)
Anion gap: 7 (ref 5–15)
BUN: 72 mg/dL — ABNORMAL HIGH (ref 8–23)
BUN: 70 mg/dL — ABNORMAL HIGH (ref 8–23)
BUN: 63 mg/dL — ABNORMAL HIGH (ref 8–23)
BUN: 67 mg/dL — ABNORMAL HIGH (ref 8–23)
CO2: 19 mmol/L — ABNORMAL LOW (ref 22–32)
CO2: 18 mmol/L — ABNORMAL LOW (ref 22–32)
CO2: 20 mmol/L — ABNORMAL LOW (ref 22–32)
CO2: 19 mmol/L — ABNORMAL LOW (ref 22–32)
Calcium: 8.2 mg/dL — ABNORMAL LOW (ref 8.9–10.3)
Calcium: 8.3 mg/dL — ABNORMAL LOW (ref 8.9–10.3)
Calcium: 7.9 mg/dL — ABNORMAL LOW (ref 8.9–10.3)
Calcium: 8.2 mg/dL — ABNORMAL LOW (ref 8.9–10.3)
Chloride: 109 mmol/L (ref 98–111)
Chloride: 109 mmol/L (ref 98–111)
Chloride: 110 mmol/L (ref 98–111)
Chloride: 110 mmol/L (ref 98–111)
Creatinine, Ser: 4.07 mg/dL — ABNORMAL HIGH (ref 0.61–1.24)
Creatinine, Ser: 4.03 mg/dL — ABNORMAL HIGH (ref 0.61–1.24)
Creatinine, Ser: 3.68 mg/dL — ABNORMAL HIGH (ref 0.61–1.24)
Creatinine, Ser: 3.81 mg/dL — ABNORMAL HIGH (ref 0.61–1.24)
GFR, Estimated: 14 mL/min — ABNORMAL LOW (ref >60)
GFR, Estimated: 14 mL/min — ABNORMAL LOW (ref >60)
GFR, Estimated: 16 mL/min — ABNORMAL LOW (ref >60)
GFR, Estimated: 15 mL/min — ABNORMAL LOW (ref >60)
Glucose, Bld: 105 mg/dL — ABNORMAL HIGH (ref 70–99)
Glucose, Bld: 121 mg/dL — ABNORMAL HIGH (ref 70–99)
Glucose, Bld: 137 mg/dL — ABNORMAL HIGH (ref 70–99)
Glucose, Bld: 126 mg/dL — ABNORMAL HIGH (ref 70–99)
Potassium: 6.2 mmol/L — ABNORMAL HIGH (ref 3.5–5.1)
Potassium: 5.7 mmol/L — ABNORMAL HIGH (ref 3.5–5.1)
Potassium: 5.3 mmol/L — ABNORMAL HIGH (ref 3.5–5.1)
Potassium: 5.3 mmol/L — ABNORMAL HIGH (ref 3.5–5.1)
Sodium: 135 mmol/L (ref 135–145)
Sodium: 135 mmol/L (ref 135–145)
Sodium: 136 mmol/L (ref 135–145)
Sodium: 136 mmol/L (ref 135–145)

## 2022-09-22 LAB — PROCALCITONIN: Procalcitonin: 0.1 ng/mL

## 2022-09-22 LAB — CBC
HCT: 32.7 % — ABNORMAL LOW (ref 39.0–52.0)
Hemoglobin: 9.9 g/dL — ABNORMAL LOW (ref 13.0–17.0)
MCH: 29 pg (ref 26.0–34.0)
MCHC: 30.3 g/dL (ref 30.0–36.0)
MCV: 95.9 fL (ref 80.0–100.0)
Platelets: 143 10*3/uL — ABNORMAL LOW (ref 150–400)
RBC: 3.41 MIL/uL — ABNORMAL LOW (ref 4.22–5.81)
RDW: 16.5 % — ABNORMAL HIGH (ref 11.5–15.5)
WBC: 6.1 10*3/uL (ref 4.0–10.5)
nRBC: 0 % (ref 0.0–0.2)

## 2022-09-22 LAB — TROPONIN I (HIGH SENSITIVITY)
Troponin I (High Sensitivity): 6 ng/L (ref <18)
Troponin I (High Sensitivity): 6 ng/L (ref <18)

## 2022-09-22 LAB — TSH: TSH: 1.315 u[IU]/mL (ref 0.350–4.500)

## 2022-09-22 LAB — CORTISOL-AM, BLOOD: Cortisol - AM: 28.1 ug/dL — ABNORMAL HIGH (ref 6.7–22.6)

## 2022-09-22 LAB — FOLATE: Folate: 8.1 ng/mL (ref >5.9)

## 2022-09-22 LAB — RESP PANEL BY RT-PCR (RSV, FLU A&B, COVID)  RVPGX2
Influenza A by PCR: NEGATIVE
Influenza B by PCR: NEGATIVE
Resp Syncytial Virus by PCR: NEGATIVE
SARS Coronavirus 2 by RT PCR: NEGATIVE

## 2022-09-22 LAB — VITAMIN B12: Vitamin B-12: 469 pg/mL (ref 180–914)

## 2022-09-22 LAB — CBG MONITORING, ED: Glucose-Capillary: 122 mg/dL — ABNORMAL HIGH (ref 70–99)

## 2022-09-22 LAB — T4, FREE: Free T4: 1.43 ng/dL — ABNORMAL HIGH (ref 0.61–1.12)

## 2022-09-22 LAB — D-DIMER, QUANTITATIVE: D-Dimer, Quant: 2.17 ug/mL-FEU — ABNORMAL HIGH (ref 0.00–0.50)

## 2022-09-22 LAB — MRSA NEXT GEN BY PCR, NASAL: MRSA by PCR Next Gen: NOT DETECTED

## 2022-09-22 LAB — LACTIC ACID, PLASMA: Lactic Acid, Venous: 2.2 mmol/L (ref 0.5–1.9)

## 2022-09-22 MED ORDER — HYDROCORTISONE SOD SUC (PF) 100 MG IJ SOLR
100.0000 mg | Freq: Three times a day (TID) | INTRAMUSCULAR | Status: DC
  Administered 2022-09-22 – 2022-09-23 (×3): 100 mg via INTRAVENOUS
  Filled 2022-09-22 (×3): qty 2

## 2022-09-22 MED ORDER — SODIUM CHLORIDE 0.9 % IV SOLN: INTRAVENOUS | Status: DC

## 2022-09-22 MED ORDER — SODIUM CHLORIDE 0.9 % IV SOLN
2.0000 g | Freq: Once | INTRAVENOUS | Status: AC
  Administered 2022-09-22: 2 g via INTRAVENOUS
  Filled 2022-09-22: qty 12.5

## 2022-09-22 MED ORDER — SODIUM CHLORIDE 0.9 % IV SOLN
2.0000 g | INTRAVENOUS | Status: DC
  Administered 2022-09-23 – 2022-09-24 (×2): 2 g via INTRAVENOUS
  Filled 2022-09-22 (×2): qty 12.5

## 2022-09-22 MED ORDER — INSULIN ASPART 100 UNIT/ML IV SOLN
5.0000 [IU] | Freq: Once | INTRAVENOUS | Status: AC
  Administered 2022-09-22: 5 [IU] via INTRAVENOUS

## 2022-09-22 MED ORDER — METOPROLOL TARTRATE 25 MG PO TABS: 25.0000 mg | ORAL_TABLET | Freq: Two times a day (BID) | ORAL | Status: DC

## 2022-09-22 MED ORDER — VANCOMYCIN VARIABLE DOSE PER UNSTABLE RENAL FUNCTION (PHARMACIST DOSING): Status: DC

## 2022-09-22 MED ORDER — SODIUM BICARBONATE 8.4 % IV SOLN
50.0000 meq | Freq: Once | INTRAVENOUS | Status: AC
  Administered 2022-09-22: 50 meq via INTRAVENOUS
  Filled 2022-09-22: qty 50

## 2022-09-22 MED ORDER — SODIUM CHLORIDE 0.9 % IV BOLUS
1000.0000 mL | Freq: Once | INTRAVENOUS | Status: AC
  Administered 2022-09-22: 1000 mL via INTRAVENOUS

## 2022-09-22 MED ORDER — ACETAMINOPHEN 650 MG RE SUPP: 650.0000 mg | Freq: Four times a day (QID) | RECTAL | Status: DC | PRN

## 2022-09-22 MED ORDER — SODIUM CHLORIDE 0.9 % IV SOLN: 250.0000 mL | INTRAVENOUS | Status: DC

## 2022-09-22 MED ORDER — DEXTROSE 50 % IV SOLN
1.0000 | Freq: Once | INTRAVENOUS | Status: AC
  Administered 2022-09-22: 50 mL via INTRAVENOUS
  Filled 2022-09-22: qty 50

## 2022-09-22 MED ORDER — ROSUVASTATIN CALCIUM 20 MG PO TABS
20.0000 mg | ORAL_TABLET | Freq: Every day | ORAL | Status: DC
  Administered 2022-09-22 – 2022-09-25 (×4): 20 mg via ORAL
  Filled 2022-09-22 (×4): qty 1

## 2022-09-22 MED ORDER — CALCIUM GLUCONATE-NACL 1-0.675 GM/50ML-% IV SOLN
1.0000 g | Freq: Once | INTRAVENOUS | Status: AC
  Administered 2022-09-22: 1000 mg via INTRAVENOUS
  Filled 2022-09-22: qty 50

## 2022-09-22 MED ORDER — ISOSORBIDE MONONITRATE ER 30 MG PO TB24
30.0000 mg | ORAL_TABLET | Freq: Every day | ORAL | Status: DC
  Administered 2022-09-22: 30 mg via ORAL
  Filled 2022-09-22 (×2): qty 1

## 2022-09-22 MED ORDER — MIDODRINE HCL 5 MG PO TABS
10.0000 mg | ORAL_TABLET | Freq: Three times a day (TID) | ORAL | Status: DC
  Administered 2022-09-22 – 2022-09-23 (×6): 10 mg via ORAL
  Filled 2022-09-22 (×6): qty 2

## 2022-09-22 MED ORDER — SODIUM CHLORIDE 0.9 % IV BOLUS
500.0000 mL | Freq: Once | INTRAVENOUS | Status: AC
  Administered 2022-09-22: 500 mL via INTRAVENOUS

## 2022-09-22 MED ORDER — APIXABAN 2.5 MG PO TABS
2.5000 mg | ORAL_TABLET | Freq: Two times a day (BID) | ORAL | Status: DC
  Administered 2022-09-22 – 2022-09-25 (×7): 2.5 mg via ORAL
  Filled 2022-09-22 (×7): qty 1

## 2022-09-22 MED ORDER — PERFLUTREN LIPID MICROSPHERE
1.0000 mL | INTRAVENOUS | Status: AC | PRN
  Administered 2022-09-22: 5 mL via INTRAVENOUS

## 2022-09-22 MED ORDER — PANTOPRAZOLE SODIUM 40 MG PO TBEC
40.0000 mg | DELAYED_RELEASE_TABLET | Freq: Every day | ORAL | Status: DC
  Administered 2022-09-22 – 2022-09-25 (×4): 40 mg via ORAL
  Filled 2022-09-22 (×4): qty 1

## 2022-09-22 MED ORDER — CHLORHEXIDINE GLUCONATE CLOTH 2 % EX PADS
6.0000 | MEDICATED_PAD | Freq: Every day | CUTANEOUS | Status: DC
  Administered 2022-09-22 – 2022-09-25 (×4): 6 via TOPICAL

## 2022-09-22 MED ORDER — ACETAMINOPHEN 325 MG PO TABS: 650.0000 mg | ORAL_TABLET | Freq: Four times a day (QID) | ORAL | Status: DC | PRN

## 2022-09-22 MED ORDER — NOREPINEPHRINE 4 MG/250ML-% IV SOLN: 0.0000 ug/min | INTRAVENOUS | Status: DC

## 2022-09-22 MED ORDER — SODIUM BICARBONATE 8.4 % IV SOLN
INTRAVENOUS | Status: DC
  Filled 2022-09-22 (×4): qty 1000

## 2022-09-22 MED ORDER — NOREPINEPHRINE 4 MG/250ML-% IV SOLN
2.0000 ug/min | INTRAVENOUS | Status: DC
  Administered 2022-09-22: 2 ug/min via INTRAVENOUS
  Filled 2022-09-22: qty 250

## 2022-09-22 MED ORDER — VANCOMYCIN HCL 1750 MG/350ML IV SOLN
1750.0000 mg | Freq: Once | INTRAVENOUS | Status: AC
  Administered 2022-09-22: 1750 mg via INTRAVENOUS
  Filled 2022-09-22: qty 350

## 2022-09-22 MED ORDER — SODIUM POLYSTYRENE SULFONATE 15 GM/60ML PO SUSP
30.0000 g | Freq: Once | ORAL | Status: AC
  Administered 2022-09-22: 30 g via ORAL
  Filled 2022-09-22: qty 120

## 2022-09-22 NOTE — ED Notes (Signed)
Patient was assisted to the restroom for a bowel movement.

## 2022-09-22 NOTE — ED Notes (Signed)
Report given to ICU RN

## 2022-09-22 NOTE — TOC Initial Note (Signed)
Transition of Care [facility]) - Initial/Assessment Note    Patient Details  Name: Rodney Barnett MRN: [id number] Date of Birth: January 11, 1941  [facility] CM/SW Contact:    Shade Flood, LCSW Phone Number: 09/22/2022, 1:36 PM  Clinical Narrative:                  Pt admitted from home. Pt was discharged home from [facility] in mid November with [facility] services. Pt is still active with hospice at home. Pt plans to return home at dc if able.  Rodney Barnett at hospice. TOC will follow and continue to assess for dc planning needs.  Expected Discharge Plan: Home w Hospice Care Barriers to Discharge: Continued Medical Work up   Patient Goals and CMS Choice Patient states their goals for this hospitalization and ongoing recovery are:: go home      Expected Discharge Plan and Services Expected Discharge Plan: Home w Hospice Care In-house Referral: Clinical Social Work   Post Acute Care Choice: Resumption of Svcs/PTA Provider Living arrangements for the past 2 months: Single Family Home                                      Prior Living Arrangements/Services Living arrangements for the past 2 months: Single Family Home Lives with:: Self Patient language and need for interpreter reviewed:: Yes Do you feel safe going back to the place where you live?: Yes        Care giver support system in place?: No (comment) Current home services: DME, Hospice Criminal Activity/Legal Involvement Pertinent to Current Situation/Hospitalization: No - Comment as needed  Activities of Daily Living      Permission Sought/Granted                  Emotional Assessment       Orientation: : Oriented to Self, Oriented to Place, Oriented to  Time, Oriented to Situation Alcohol / Substance Use: Not Applicable Psych Involvement: No (comment)  Admission diagnosis:  Hyperkalemia [E87.5] AKI (acute kidney injury) ([place]) [N17.9] Fall, initial encounter  [W19.XXXA] Contusion of forehead, initial encounter [S00.83XA] Hypotension due to hypovolemia [I95.89, E86.1] Patient Active Problem List   Diagnosis Date Noted   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 ([place]) 09/22/2022   Hyperkalemia 09/22/2022   Hyponatremia 62/69/4854   Acute systolic heart failure ([place]) 08/14/2022   Coronary artery disease involving coronary bypass graft of native heart without angina pectoris 08/14/2022   Atrial fibrillation with RVR ([place]) 08/13/2022   Lung mass 08/13/2022   Thyroid nodule 08/13/2022   Iron deficiency anemia    Diverticulosis of colon without hemorrhage    Grade II internal hemorrhoids    Cecal polyp    Adenomatous polyp of descending colon    GI bleeding 08/28/2021   Acquired thrombophilia ([place]) 12/26/2019   Spinal stenosis of lumbar region 12/26/2019   Bladder cancer ([place]) 11/21/2018   Chronic back pain 11/16/2013   Obesity (BMI 30-39.9) 06/07/2013   Peripheral neuropathy 06/07/2012   DYSURIA 05/14/2010   INSOMNIA 04/24/2010   LIPOMA 11/15/2009   RHINITIS 11/15/2009   CORONARY ATHEROSCLEROSIS NATIVE CORONARY ARTERY 10/07/2009   Hyperlipidemia 12/27/2008   ERECTILE DYSFUNCTION 12/27/2008   Essential hypertension 12/27/2008   PCP:  [name], MD Pharmacy:   [place], [address]  [place] [address] Phone: [phone] Fax: [phone]  [facility] [place] [id number] - [place], [address] AT [address] [address] Phone: [phone] Fax: [phone]  [place], [address] [place] [address] Phone: [phone] Fax: [phone]     Social Determinants of Health (SDOH) Interventions    Readmission Risk Interventions    09/22/2022    1:32 PM  Readmission Risk Prevention Plan  Transportation Screening Complete  HRI or  [place] Complete  Social Work Consult for [place] Planning/Counseling Complete  Palliative Care Screening Complete  Medication Review Press photographer) Complete

## 2022-09-22 NOTE — Progress Notes (Signed)
*  PRELIMINARY RESULTS* Echocardiogram 2D Echocardiogram has been performed.  Rodney Barnett 09/22/2022, 2:24 PM

## 2022-09-22 NOTE — Progress Notes (Signed)
Pharmacy Antibiotic Note  Rodney Barnett is a 81 y.o. male admitted on 09/21/2022 with sepsis.  Pharmacy has been consulted for Cefepime dosing.  Plan: Cefepime 2gm IV q24h F/U cxs and clinical progress Monitor V/S, labs  Weight: 90.8 kg (200 lb 3.2 oz)  Temp (24hrs), Avg:96.1 F (35.6 C), Min:96.1 F (35.6 C), Max:96.1 F (35.6 C)  Recent Labs  Lab 09/21/22 2141 09/22/22 0429 09/22/22 0732 09/22/22 0844  WBC 8.0 6.1  --   --   CREATININE 4.24* 4.07* 4.03*  --   LATICACIDVEN  --   --   --  2.2*    Estimated Creatinine Clearance: 16.5 mL/min (A) (by C-G formula based on SCr of 4.03 mg/dL (H)).    Allergies  Allergen Reactions   Lipitor [Atorvastatin] Other (See Comments)    myalgia    Antimicrobials this admission: Cefepime 12/19  >>   Microbiology results: 12/19 BCx: pending  Thank you for allowing pharmacy to be a part of this patient's care.  Isac Sarna, BS Pharm D, BCPS Clinical Pharmacist 09/22/2022 11:10 AM

## 2022-09-22 NOTE — ED Notes (Signed)
Warm blankets placed on pt. 

## 2022-09-22 NOTE — Hospital Course (Signed)
81 year old male with a history of coronary artery disease status post CABG, bladder cancer status post TURBT, atrial fibrillation, GI bleed, chronic back pain, COPD, hypertension, hyperlipidemia presenting after a fall at home.  Patient was on the commode when he was trying to get up and fell forward and hit his head.  The patient denies syncope.  He states that his oral intake has been poor.  He lives alone with his grandson next-door.  The patient denies any new medications.  He denies any focal extremity weakness, dysarthria, visual disturbance.  He has generalized weakness.  He denies any fevers, chills, chest pain, shortness breath, coughing, hemoptysis, nausea, vomiting, diarrhea, abdominal pain. In the ED, the patient was afebrile and hypotensive.  Oxygen saturation was 100% on room air.  WBC 8.0, hemoglobin 10.0, platelets 184,000.  Sodium 133, potassium 6.4, bicarbonate 20, serum creatinine is 4.24.  LFTs were unremarkable.  Troponin 6>>6>>6.  EKG showed atrial fibrillation with nonspecific T wave changes.  BNP 158.  CT of the brain was negative for any acute findings.  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  There was no cervical fractures, but showed severe central canal stenosis.

## 2022-09-22 NOTE — Progress Notes (Signed)
PROGRESS NOTE  Rodney Barnett QMV:[id number] DOB: [birth year] DOA: 09/21/2022 PCP: [name], MD  Brief History:  81 year old male with a history of coronary artery disease status post CABG, bladder cancer status post TURBT, atrial fibrillation, GI bleed, chronic back pain, COPD, hypertension, hyperlipidemia presenting after a fall at home.  Patient was on the commode when he was trying to get up and fell forward and hit his head.  The patient denies syncope.  He states that his oral intake has been poor.  He lives alone with his grandson next-door.  The patient denies any new medications.  He denies any focal extremity weakness, dysarthria, visual disturbance.  He has generalized weakness.  He denies any fevers, chills, chest pain, shortness breath, coughing, hemoptysis, nausea, vomiting, diarrhea, abdominal pain. In the ED, the patient was afebrile and hypotensive.  Oxygen saturation was 100% on room air.  WBC 8.0, hemoglobin 10.0, platelets 184,000.  Sodium 133, potassium 6.4, bicarbonate 20, serum creatinine is 4.24.  LFTs were unremarkable.  Troponin 6>>6>>6.  EKG showed atrial fibrillation with nonspecific T wave changes.  BNP 158.  CT of the brain was negative for any acute findings.  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  There was no cervical fractures, but showed severe central canal stenosis.   Assessment/Plan: Acute on chronic renal failure--CKD3a -Secondary to volume depletion -Continue IV fluids -Hold furosemide  Hyperkalemia -Give temporizing measures -Give Lokelma  Hypotension -Check PCT -A.m. cortisol -Lactic acid -Continue IV fluids -Empiric IV antibiotics  Generalized weakness -The patient has had numerous hospital admissions secondary to falls -Ultimate plan is to go home with hospice if he can be stabilized -PT evaluation  Acute on chronic renal failure--CKD3a -baseline creatinine 1.2-1.5 -presented  with serum creatinine 4.24 -due to hypotension and volume depletion  Atrial fibrillation -Rate controlled -Restart apixaban for now -Holding metoprolol secondary to hypotension  Coronary artery disease -No chest pain presently -Status post CABG -Personally reviewed EKG--atrial fibrillation, nonspecific ST changes  Mixed hyperlipidemia -Continue statin  Chronic back pain -Restart hydrocodone and gabapentin        Family Communication:  no  Family at bedside  Consultants:  palliative  Code Status:  DNR--confirmed with patient  DVT Prophylaxis:   Heparin   Procedures: As Listed in Progress Note Above  Antibiotics: Cefepime 12/19>.   The patient is critically ill with multiple organ systems failure and requires high complexity decision making for assessment and support, frequent evaluation and titration of therapies, application of advanced monitoring technologies and extensive interpretation of multiple databases.  Critical care time - 35 mins.      Subjective: Patient denies fevers, chills, headache, chest pain, dyspnea, nausea, vomiting, diarrhea, abdominal pain, dysuria, hematuria, hematochezia, and melena.   Objective: Vitals:   09/22/22 0115 09/22/22 0239 09/22/22 0300 09/22/22 0541  BP: (!) 64/48 (!) 70/45 (!) 85/59 (!) 74/52  Pulse: 88 (!) 51 (!) 113 85  Resp: '14 15 20 17  '$ SpO2: 100% 96% 100% 100%    Intake/Output Summary (Last 24 hours) at 09/22/2022 0802 Last data filed at 09/22/2022 0432 Gross per 24 hour  Intake 2000 ml  Output --  Net 2000 ml   Weight change:  Exam:  General:  Pt is alert, follows commands appropriately, not in acute distress HEENT: No icterus, No thrush, No neck mass, [place]/AT Cardiovascular: RRR, S1/S2, no rubs, no gallops Respiratory: bibasilar rales. No wheeze  Abdomen: Soft/+BS, non tender, non distended, no guarding Extremities: No edema, No lymphangitis, No petechiae, No rashes, no synovitis   Data  Reviewed: I have personally reviewed following labs and imaging studies Basic Metabolic Panel: Recent Labs  Lab 09/21/22 2141 09/22/22 0429  NA 133* 135  K 6.4* 6.2*  CL 106 109  CO2 20* 19*  GLUCOSE 122* 105*  BUN 72* 72*  CREATININE 4.24* 4.07*  CALCIUM 8.1* 8.2*   Liver Function Tests: Recent Labs  Lab 09/21/22 2141  AST 26  ALT 10  ALKPHOS 73  BILITOT 0.8  PROT 6.5  ALBUMIN 2.3*   No results for input(s): "LIPASE", "AMYLASE" in the last 168 hours. No results for input(s): "AMMONIA" in the last 168 hours. Coagulation Profile: No results for input(s): "INR", "PROTIME" in the last 168 hours. CBC: Recent Labs  Lab 09/21/22 2141 09/22/22 0429  WBC 8.0 6.1  NEUTROABS 6.7  --   HGB 10.0* 9.9*  HCT 32.9* 32.7*  MCV 95.1 95.9  PLT 184 143*   Cardiac Enzymes: No results for input(s): "CKTOTAL", "CKMB", "CKMBINDEX", "TROPONINI" in the last 168 hours. BNP: Invalid input(s): "POCBNP" CBG: Recent Labs  Lab 09/22/22 0016  GLUCAP 122*   HbA1C: No results for input(s): "HGBA1C" in the last 72 hours. Urine analysis:    Component Value Date/Time   COLORURINE yellow 05/14/2010 0900   APPEARANCEUR Clear 05/14/2010 0900   LABSPEC <1.005 05/14/2010 0900   PHURINE 6.0 05/14/2010 0900   HGBUR large 05/14/2010 0900   BILIRUBINUR negative 02/17/2021 1139   PROTEINUR Negative 02/17/2021 1139   UROBILINOGEN 0.2 02/17/2021 1139   UROBILINOGEN 0.2 05/14/2010 0900   NITRITE positive 02/17/2021 1139   NITRITE positive 05/14/2010 0900   LEUKOCYTESUR Large (3+) (A) 02/17/2021 1139   Sepsis Labs: '@LABRCNTIP'$ (procalcitonin:4,lacticidven:4) )No results found for this or any previous visit (from the past 240 hour(s)).   Scheduled Meds:  apixaban  2.5 mg Oral BID   isosorbide mononitrate  30 mg Oral Daily   pantoprazole  40 mg Oral Daily   rosuvastatin  20 mg Oral Daily   sodium polystyrene  30 g Oral Once   Continuous Infusions:  dextrose Stopped (09/22/22 0015)    sodium bicarbonate 150 mEq in dextrose 5 % 1,150 mL infusion      Procedures/Studies: CT Head Wo Contrast  Addendum Date: 09/22/2022   ADDENDUM REPORT: 09/22/2022 00:33 ADDENDUM: Please note findings should state partially visualized heterogeneous oval mass noted inferior to the right thyroid gland. Finding better evaluated on CT chest 08/13/2022 and ultrasound thyroid 08/13/2022. This has been evaluated on previous imaging. (ref: J Am Coll Radiol. 2015 Feb;12(2): 143-50). Electronically Signed   By: Iven Finn M.D.   On: 09/22/2022 00:33   Result Date: 09/22/2022 CLINICAL DATA:  Head trauma, minor (Age >= 65y); Neck trauma (Age >= 65y). Pt with a fall and laceration to L side of forehead. Pt is on blood thinners. Not complaining of any pain. EXAM: CT HEAD WITHOUT CONTRAST CT CERVICAL SPINE WITHOUT CONTRAST TECHNIQUE: Multidetector CT imaging of the head and cervical spine was performed following the standard protocol without intravenous contrast. Multiplanar CT image reconstructions of the cervical spine were also generated. RADIATION DOSE REDUCTION: This exam was performed according to the departmental dose-optimization program which includes automated exposure control, adjustment of the mA and/or kV according to patient size and/or use of iterative reconstruction technique. COMPARISON:  MRI head 04/03/2022 FINDINGS: CT HEAD FINDINGS Brain: Patchy and confluent areas of decreased attenuation are noted  throughout the deep and periventricular white matter of the cerebral hemispheres bilaterally, compatible with chronic microvascular ischemic disease. No evidence of large-territorial acute infarction. No parenchymal hemorrhage. No mass lesion. No extra-axial collection. No mass effect or midline shift. No hydrocephalus. Basilar cisterns are patent. Vascular: No hyperdense vessel. Atherosclerotic calcifications are present within the cavernous internal carotid arteries. Skull: No acute fracture or focal  lesion. Sinuses/Orbits: Right maxillary sinus mucosal thickening. Associated calcification. Right maxillary sinus wall hypertrophy. Paranasal sinuses and mastoid air cells are clear. Bilateral lens replacement. Otherwise the orbits are unremarkable. Other: No large hematoma formation. CT CERVICAL SPINE FINDINGS Alignment: Normal. Skull base and vertebrae: Multilevel moderate severe degenerative changes spine most prominent at the C4 through C7 levels. Associated bulky posterior osteophyte complex formations in likely PLL calcification. Associated multilevel severe osseous neural foraminal and central canal stenosis. No acute fracture. No aggressive appearing focal osseous lesion or focal pathologic process. Soft tissues and spinal canal: No prevertebral fluid or swelling. No visible canal hematoma. Upper chest: Biapical assess for the spine biapical pleural/pulmonary scarring. Other: None. IMPRESSION: 1. No acute intracranial abnormality. 2. No acute displaced fracture or traumatic listhesis of the cervical spine. 3. 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 4. Chronic right maxillary sinusitis. Electronically Signed: By: Iven Finn M.D. On: 09/21/2022 22:42   CT Cervical Spine Wo Contrast  Addendum Date: 09/22/2022   ADDENDUM REPORT: 09/22/2022 00:33 ADDENDUM: Please note findings should state partially visualized heterogeneous oval mass noted inferior to the right thyroid gland. Finding better evaluated on CT chest 08/13/2022 and ultrasound thyroid 08/13/2022. This has been evaluated on previous imaging. (ref: J Am Coll Radiol. 2015 Feb;12(2): 143-50). Electronically Signed   By: Iven Finn M.D.   On: 09/22/2022 00:33   Result Date: 09/22/2022 CLINICAL DATA:  Head trauma, minor (Age >= 65y); Neck trauma (Age >= 65y). Pt with a fall and laceration to L side of forehead. Pt is on blood thinners. Not complaining of any pain. EXAM: CT HEAD  WITHOUT CONTRAST CT CERVICAL SPINE WITHOUT CONTRAST TECHNIQUE: Multidetector CT imaging of the head and cervical spine was performed following the standard protocol without intravenous contrast. Multiplanar CT image reconstructions of the cervical spine were also generated. RADIATION DOSE REDUCTION: This exam was performed according to the departmental dose-optimization program which includes automated exposure control, adjustment of the mA and/or kV according to patient size and/or use of iterative reconstruction technique. COMPARISON:  MRI head 04/03/2022 FINDINGS: CT HEAD FINDINGS Brain: Patchy and confluent areas of decreased attenuation are noted throughout the deep and periventricular white matter of the cerebral hemispheres bilaterally, compatible with chronic microvascular ischemic disease. No evidence of large-territorial acute infarction. No parenchymal hemorrhage. No mass lesion. No extra-axial collection. No mass effect or midline shift. No hydrocephalus. Basilar cisterns are patent. Vascular: No hyperdense vessel. Atherosclerotic calcifications are present within the cavernous internal carotid arteries. Skull: No acute fracture or focal lesion. Sinuses/Orbits: Right maxillary sinus mucosal thickening. Associated calcification. Right maxillary sinus wall hypertrophy. Paranasal sinuses and mastoid air cells are clear. Bilateral lens replacement. Otherwise the orbits are unremarkable. Other: No large hematoma formation. CT CERVICAL SPINE FINDINGS Alignment: Normal. Skull base and vertebrae: Multilevel moderate severe degenerative changes spine most prominent at the C4 through C7 levels. Associated bulky posterior osteophyte complex formations in likely PLL calcification. Associated multilevel severe osseous neural foraminal and central canal stenosis. No acute fracture. No aggressive appearing focal osseous lesion or focal pathologic process. Soft tissues and  spinal canal: No prevertebral fluid or  swelling. No visible canal hematoma. Upper chest: Biapical assess for the spine biapical pleural/pulmonary scarring. Other: None. IMPRESSION: 1. No acute intracranial abnormality. 2. No acute displaced fracture or traumatic listhesis of the cervical spine. 3. 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 4. Chronic right maxillary sinusitis. Electronically Signed: By: Iven Finn M.D. On: 09/21/2022 22:42   DG Hip Unilat With Pelvis 2-3 Views Right  Result Date: 09/21/2022 CLINICAL DATA:  Fall EXAM: DG HIP (WITH OR WITHOUT PELVIS) 2-3V RIGHT COMPARISON:  None Available. FINDINGS: No fracture or dislocation is seen. Bilateral hip joint spaces are preserved. Visualized bony pelvis is intact. Degenerative changes of the lower lumbar spine. IMPRESSION: Negative. Electronically Signed   By: Julian Hy M.D.   On: 09/21/2022 22:54   DG Knee Complete 4 Views Right  Result Date: 09/21/2022 CLINICAL DATA:  Fall EXAM: RIGHT KNEE - COMPLETE 4+ VIEW COMPARISON:  None Available. FINDINGS: No fracture or dislocation is seen. The joint spaces are preserved. Visualized soft tissues are within normal limits. No suprapatellar knee joint effusion. IMPRESSION: Negative. Electronically Signed   By: Julian Hy M.D.   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 Mild left basilar opacity, favoring atelectasis. No pleural effusion or pneumothorax. The heart is normal in size. Postsurgical changes related to prior CABG. Median sternotomy. 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 Electronically Signed   By: Julian Hy  M.D.   On: 09/21/2022 22:54    Orson Eva, DO  [facility]  If 7PM-7AM, please contact night-coverage [email or web address] Password [email or web address] 09/22/2022, 8:02 AM   LOS: 0 days

## 2022-09-22 NOTE — Progress Notes (Signed)
Met with grandson and brother at bedside Updated them on patient's condition and workup Discussed overall poor prognosis in the setting of his poor baseline function, frailty, comorbidities and severity of current illness --discussed that high likelihood of mortality despite maximal/optimal therapy in setting of multi-system failure --confirmed DNR --continue current care for next 24 hours --if no significant improvement, may consider comfort measures --palliative medicine consulted  DTat

## 2022-09-22 NOTE — H&P (Signed)
History and Physical    Patient: Rodney Barnett ZOX:[id number] DOB: 09-Apr-1941 DOA: 09/21/2022 DOS: the patient was seen and examined on 09/22/2022 PCP: [name], MD  Patient coming from: Home  Chief Complaint:  Chief Complaint  Patient presents with   Fall   Hypotension   HPI: Rodney Barnett is a 81 y.o. male with medical history significant of hypertension, hyperlipidemia, CAD s/p CABG,  CHF (diastolic), lung mass, peripheral neuropathy presents with c/o near syncope,  he fell forward while on the commode when he bent over to stand up and hit is head.  Pt doesn't think that he lost consciousness.   Pt denies cp, palp, sob, n/v, abd pain, diarrhea, brbpr, black stool. Dysuria, hematuria.     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   CHF (congestive heart failure) (HCC)    Chronic back pain    Chronic rhinitis    Coronary atherosclerosis of native coronary artery    Multivessel, LVEF 55-60%, basal inferior hypokinesis, known graft disease   Diverticulosis    Erectile dysfunction    Essential hypertension    History of bladder cancer    Insomnia    Internal hemorrhoids    Lipoma    Mixed hyperlipidemia    Peripheral neuropathy    Tubular adenoma of colon    Past Surgical History:  Procedure Laterality Date   COLONOSCOPY WITH PROPOFOL N/A 09/01/2021   Procedure: COLONOSCOPY WITH PROPOFOL;  Surgeon: Lavena Bullion, DO;  Location: [facility];  Service: Gastroenterology;  Laterality: N/A;   CORONARY ARTERY BYPASS GRAFT  1994   LIMA to LAD, SVG to RCA, SVG to diagonal   ENDOSCOPIC MUCOSAL RESECTION  09/01/2021   Procedure: ENDOSCOPIC MUCOSAL RESECTION;  Surgeon: Lavena Bullion, DO;  Location: [facility];  Service: Gastroenterology;;   ESOPHAGOGASTRODUODENOSCOPY (EGD) WITH PROPOFOL N/A 09/01/2021   Procedure: ESOPHAGOGASTRODUODENOSCOPY (EGD)  WITH PROPOFOL;  Surgeon: Lavena Bullion, DO;  Location: [facility];  Service: Gastroenterology;  Laterality: N/A;   HEMOSTASIS CLIP PLACEMENT  09/01/2021   Procedure: HEMOSTASIS CLIP PLACEMENT;  Surgeon: Lavena Bullion, DO;  Location: [place];  Service: Gastroenterology;;   HOT HEMOSTASIS N/A 09/01/2021   Procedure: HOT HEMOSTASIS (ARGON PLASMA COAGULATION/BICAP);  Surgeon: Lavena Bullion, DO;  Location: [facility];  Service: Gastroenterology;  Laterality: N/A;   POLYPECTOMY  09/01/2021   Procedure: POLYPECTOMY;  Surgeon: Lavena Bullion, DO;  Location: [facility];  Service: Gastroenterology;;   SUBMUCOSAL LIFTING INJECTION  09/01/2021   Procedure: SUBMUCOSAL LIFTING INJECTION;  Surgeon: Lavena Bullion, DO;  Location: [place] [facility]OSCOPY;  Service: Gastroenterology;;   TONSILLECTOMY     TRANSURETHRAL RESECTION OF BLADDER TUMOR N/A 09/09/2018   Procedure: TRANSURETHRAL RESECTION OF BLADDER TUMOR (TURBT) WITH INSTILLATION OF POST OPERATIVE CHEMOTHERAPY;  Surgeon: Kathie Rhodes, MD;  Location: [facility];  Service: Urology;  Laterality: N/A;   Social History:  reports that he quit smoking about 29 years ago. His smoking use included cigarettes. He started smoking about 68 years ago. He has a 120.00 pack-year smoking history. He has never used smokeless tobacco. He reports that he does not drink alcohol and does not use drugs.  Allergies  Allergen Reactions   Lipitor [Atorvastatin] Other (See Comments)    myalgia    Family History  Problem Relation Age of Onset   Hypertension Other  Cancer Brother        renal cancer    Prior to Admission medications   Medication Sig Start Date End Date Taking? Authorizing Provider  apixaban (ELIQUIS) 5 MG TABS tablet Take 1 tablet (5 mg total) by mouth 2 (two) times daily. 08/16/22   Johnson, Clanford L, MD  fluticasone (FLONASE) 50 MCG/ACT nasal spray Place 2 sprays into both nostrils daily. 08/19/22   Burchette, Alinda Sierras, MD   furosemide (LASIX) 40 MG tablet Take 1 tablet (40 mg total) by mouth daily. 09/10/22 09/10/23  Burchette, Alinda Sierras, MD  gabapentin (NEURONTIN) 300 MG capsule TAKE (2) CAPSULES BY MOUTH THREE TIMES DAILY. 09/18/22   Burchette, Alinda Sierras, MD  HYDROcodone-acetaminophen (NORCO) 10-325 MG tablet Take 1 tablet by mouth every 6 (six) hours as needed (using for sleep). 08/19/22   Burchette, Alinda Sierras, MD  Iron, Ferrous Sulfate, 325 (65 Fe) MG TABS Take 1 tablet by mouth daily. Patient not taking: Reported on 08/19/2022 10/17/21   Thornton Park, MD  isosorbide mononitrate (IMDUR) 30 MG 24 hr tablet TAKE 1 TABLET ONCE DAILY. 09/17/22   Burchette, Alinda Sierras, MD  metoprolol tartrate (LOPRESSOR) 25 MG tablet Take 1 tablet (25 mg total) by mouth 2 (two) times daily. 08/16/22   Johnson, Clanford L, MD  nitroGLYCERIN (NITROSTAT) 0.4 MG SL tablet Place 1 tablet (0.4 mg total) under the tongue every 5 (five) minutes x 3 doses as needed for chest pain (If no relief after 3rd dose, proceed to the ED for an evaluation or call 911). 08/20/21   Burchette, Alinda Sierras, MD  Omega-3 Fatty Acids (FISH OIL) 1000 MG CPDR Take 1,000 mg by mouth daily.    [provider]  pantoprazole (PROTONIX) 40 MG tablet Take 1 tablet (40 mg total) by mouth daily. 09/10/22   Burchette, Alinda Sierras, MD  potassium chloride SA (KLOR-CON M) 20 MEQ tablet Take 1 tablet (20 mEq total) by mouth daily. 09/10/22   Burchette, Alinda Sierras, MD  rosuvastatin (CRESTOR) 20 MG tablet TAKE 1 TABLET ONCE DAILY. 09/16/22   [name], MD    Physical Exam: Vitals:   09/21/22 2144 09/21/22 2156 09/22/22 0029 09/22/22 0115  BP: (!) 169/144 (!) 80/50 (!) 62/49 (!) 64/48  Pulse: 90  90 88  Resp: (!) 27 (!) '23 19 14  '$ SpO2: 99% 100% 100% 100%   Heent: anicteric Neck: no jvd Heart: rrr s1, s2,  Lung:  Abd: Ext:   Data Reviewed:   Na 133, K 6.4, Bun 72, Creat 4.24 Alb 2.3 Glucose 122, Hco3 20 Ast 26, Alt 10 Wbc 8.0, Hgb 10.0, Plt 184  Trop  10   BNP 158 CT brain / C spine CT HEAD FINDINGS   Brain:   Patchy and confluent areas of decreased attenuation are noted throughout the deep and periventricular white matter of the cerebral hemispheres bilaterally, compatible with chronic microvascular ischemic disease. No evidence of large-territorial acute infarction. No parenchymal hemorrhage. No mass lesion. No extra-axial collection.   No mass effect or midline shift. No hydrocephalus. Basilar cisterns are patent.   Vascular: No hyperdense vessel. Atherosclerotic calcifications are present within the cavernous internal carotid arteries.   Skull: No acute fracture or focal lesion.   Sinuses/Orbits: Right maxillary sinus mucosal thickening. Associated calcification. Right maxillary sinus wall hypertrophy. Paranasal sinuses and mastoid air cells are clear. Bilateral lens replacement. Otherwise the orbits are unremarkable.   Other: No large hematoma formation.   CT CERVICAL SPINE FINDINGS   Alignment: Normal.  Skull base and vertebrae: Multilevel moderate severe degenerative changes spine most prominent at the C4 through C7 levels. Associated bulky posterior osteophyte complex formations in likely PLL calcification. Associated multilevel severe osseous neural foraminal and central canal stenosis. No acute fracture. No aggressive appearing focal osseous lesion or focal pathologic process.   Soft tissues and spinal canal: No prevertebral fluid or swelling. No visible canal hematoma.   Upper chest: Biapical assess for the spine biapical pleural/pulmonary scarring.   Other: None.   IMPRESSION: 1. No acute intracranial abnormality. 2. No acute displaced fracture or traumatic listhesis of the cervical spine. 3. 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 4. Chronic right maxillary sinusitis.    CXR  IMPRESSION: Large mass in the medial right lower lobe,  compatible with primary bronchogenic neoplasm, better evaluated on prior CT.     Assessment and Plan: Hyperkalemia Kayexalate 30gm po x1 Na Hco3 1 amp iv x1 Calcium gluconate1 gm iv x1 D50 1amp iv x1 Regular insulin 5 units iv x1 Bmp q4h x6  ARF Hold Lasix Hold Gabapentin (cleared renally) Check renal ultrasound Hydrate with d5NaCo3 at 127m / hr Check bmp q4h x6  Syncope likely Vasovagal syncope Tele Trop I (cycle) , D dimer  Hypotension Check trop, blood culture x2, lactic acid Consider check VQ scan if D dimer positive, though unlikely since on eliquis  Pafib Cont Eliquis, reduce dose to 2.'5mg'$  po bid  Anemia Check cbc   Advance Care Planning:   Code Status: Prior DNR/DNI  Consults: none  Family Communication: w patient  Severity of Illness: The appropriate patient status for this patient is INPATIENT. Inpatient status is judged to be reasonable and necessary in order to provide the required intensity of service to ensure the patient's safety. The patient's presenting symptoms, physical exam findings, and initial radiographic and laboratory data in the context of their chronic comorbidities is felt to place them at high risk for further clinical deterioration. Furthermore, it is not anticipated that the patient will be medically stable for discharge from the hospital within 2 midnights of admission.   * I certify that at the point of admission it is my clinical judgment that the patient will require inpatient hospital care spanning beyond 2 midnights from the point of admission due to high intensity of service, high risk for further deterioration and high frequency of surveillance required.*  Author: JJani Gravel MD 09/22/2022 2:35 AM  For on call review [email or web address]

## 2022-09-22 NOTE — ED Notes (Signed)
Primary & Charge RN informed of pts BP 78/46, pt cool to touch, once phlebotomy completes [facility] collection, this RN will assess rectal temp

## 2022-09-22 NOTE — Progress Notes (Signed)
Pharmacy Antibiotic Note  Rodney Barnett is a 81 y.o. male admitted on 09/21/2022 with sepsis.  Pharmacy has been consulted for vanco dosing.  Plan: Vanco 1750 mg iv x1 dose then by levels  Weight: 90.8 kg (200 lb 3.2 oz)  Temp (24hrs), Avg:96.1 F (35.6 C), Min:96.1 F (35.6 C), Max:96.1 F (35.6 C)  Recent Labs  Lab 09/21/22 2141 09/22/22 0429 09/22/22 0732 09/22/22 0844 09/22/22 1114 09/22/22 1602  WBC 8.0 6.1  --   --   --   --   CREATININE 4.24* 4.07* 4.03*  --  3.68* 3.81*  LATICACIDVEN  --   --   --  2.2*  --   --     Estimated Creatinine Clearance: 17.5 mL/min (A) (by C-G formula based on SCr of 3.81 mg/dL (H)).    Allergies  Allergen Reactions   Lipitor [Atorvastatin] Other (See Comments)    myalgia    Antimicrobials this admission: vanco 12/19 >>  Cefepime 12/19 >>   Dose adjustments this admission: Vanco by levels based on variable Scr   Thank you for allowing pharmacy to be a part of this patient's care.  Vaughan Basta BS, PharmD, BCPS Clinical Pharmacist 09/22/2022 6:05 PM  Contact: [phone] after 3 PM  "Be curious, not judgmental..." -Jamal Maes

## 2022-09-22 NOTE — Progress Notes (Signed)
Patient x2 assist to toilet. Patient adamant about walking to toilet instead of using bedpan despite blood pressures being lower. Patient with one good blood pressure reading, stated his blood pressure was good and he could go to toilet. Educated patient on safety plan, but patient still wanted to walk to toilet. This RN and another RN walked patient with a front wheel walker to toilet in room, no issues. Patient wanted a few minutes to sit there. After patient finished, this RN and another RN stood patient back up from toilet with front wheel walker and patient slowly fell to his knees after 2 steps back to the bed. Patient said to give him a minute and he'd be okay to get back up. Patient unable to get back up, third RN called for assistance to get patient up. Patient placed back in bed and educated patient he could no longer get out of bed. Patient's only complaints are pain in his legs. Dr. Carles Collet made aware.

## 2022-09-23 DIAGNOSIS — E871 Hypo-osmolality and hyponatremia: Secondary | ICD-10-CM | POA: Diagnosis not present

## 2022-09-23 DIAGNOSIS — E782 Mixed hyperlipidemia: Secondary | ICD-10-CM | POA: Diagnosis not present

## 2022-09-23 DIAGNOSIS — E875 Hyperkalemia: Secondary | ICD-10-CM | POA: Diagnosis not present

## 2022-09-23 DIAGNOSIS — Z515 Encounter for palliative care: Secondary | ICD-10-CM

## 2022-09-23 DIAGNOSIS — E861 Hypovolemia: Secondary | ICD-10-CM

## 2022-09-23 DIAGNOSIS — I9589 Other hypotension: Secondary | ICD-10-CM

## 2022-09-23 DIAGNOSIS — N179 Acute kidney failure, unspecified: Secondary | ICD-10-CM | POA: Diagnosis not present

## 2022-09-23 LAB — COMPREHENSIVE METABOLIC PANEL
ALT: 10 U/L (ref 0–44)
AST: 22 U/L (ref 15–41)
Albumin: 2.2 g/dL — ABNORMAL LOW (ref 3.5–5.0)
Alkaline Phosphatase: 71 U/L (ref 38–126)
Anion gap: 9 (ref 5–15)
BUN: 60 mg/dL — ABNORMAL HIGH (ref 8–23)
CO2: 18 mmol/L — ABNORMAL LOW (ref 22–32)
Calcium: 8.4 mg/dL — ABNORMAL LOW (ref 8.9–10.3)
Chloride: 110 mmol/L (ref 98–111)
Creatinine, Ser: 3.21 mg/dL — ABNORMAL HIGH (ref 0.61–1.24)
GFR, Estimated: 19 mL/min — ABNORMAL LOW (ref >60)
Glucose, Bld: 171 mg/dL — ABNORMAL HIGH (ref 70–99)
Potassium: 5.5 mmol/L — ABNORMAL HIGH (ref 3.5–5.1)
Sodium: 137 mmol/L (ref 135–145)
Total Bilirubin: 0.8 mg/dL (ref 0.3–1.2)
Total Protein: 6.2 g/dL — ABNORMAL LOW (ref 6.5–8.1)

## 2022-09-23 LAB — CBC
HCT: 33.5 % — ABNORMAL LOW (ref 39.0–52.0)
Hemoglobin: 10.5 g/dL — ABNORMAL LOW (ref 13.0–17.0)
MCH: 29.2 pg (ref 26.0–34.0)
MCHC: 31.3 g/dL (ref 30.0–36.0)
MCV: 93.3 fL (ref 80.0–100.0)
Platelets: 209 10*3/uL (ref 150–400)
RBC: 3.59 MIL/uL — ABNORMAL LOW (ref 4.22–5.81)
RDW: 16.8 % — ABNORMAL HIGH (ref 11.5–15.5)
WBC: 10.1 10*3/uL (ref 4.0–10.5)
nRBC: 0 % (ref 0.0–0.2)

## 2022-09-23 LAB — VANCOMYCIN, RANDOM: Vancomycin Rm: 18 ug/mL

## 2022-09-23 LAB — LACTIC ACID, PLASMA: Lactic Acid, Venous: 1.2 mmol/L (ref 0.5–1.9)

## 2022-09-23 MED ORDER — VANCOMYCIN HCL 1250 MG/250ML IV SOLN: 1250.0000 mg | INTRAVENOUS | Status: DC

## 2022-09-23 MED ORDER — SODIUM BICARBONATE 650 MG PO TABS
650.0000 mg | ORAL_TABLET | Freq: Three times a day (TID) | ORAL | Status: DC
  Administered 2022-09-23 – 2022-09-25 (×7): 650 mg via ORAL
  Filled 2022-09-23 (×7): qty 1

## 2022-09-23 MED ORDER — MIDODRINE HCL 5 MG PO TABS
5.0000 mg | ORAL_TABLET | Freq: Three times a day (TID) | ORAL | Status: DC
  Administered 2022-09-24 – 2022-09-25 (×6): 5 mg via ORAL
  Filled 2022-09-23 (×6): qty 1

## 2022-09-23 MED ORDER — SODIUM ZIRCONIUM CYCLOSILICATE 10 G PO PACK
10.0000 g | PACK | Freq: Every day | ORAL | Status: DC
  Administered 2022-09-23: 10 g via ORAL
  Filled 2022-09-23: qty 1

## 2022-09-23 MED ORDER — HYDROCODONE-ACETAMINOPHEN 5-325 MG PO TABS
1.0000 | ORAL_TABLET | Freq: Four times a day (QID) | ORAL | Status: DC | PRN
  Administered 2022-09-24 – 2022-09-25 (×3): 1 via ORAL
  Filled 2022-09-23 (×3): qty 1

## 2022-09-23 NOTE — Progress Notes (Signed)
PROGRESS NOTE  Rodney Barnett OAC:[id number] DOB: January 14, 1941 DOA: 09/21/2022 PCP: [name], MD  Brief History:  81 year old male with a history of coronary artery disease status post CABG, bladder cancer status post TURBT, atrial fibrillation, GI bleed, chronic back pain, COPD, hypertension, hyperlipidemia presenting after a fall at home.  Patient was on the commode when he was trying to get up and fell forward and hit his head.  The patient denies syncope.  He states that his oral intake has been poor.  He lives alone with his grandson next-door.  The patient denies any new medications.  He denies any focal extremity weakness, dysarthria, visual disturbance.  He has generalized weakness.  He denies any fevers, chills, chest pain, shortness breath, coughing, hemoptysis, nausea, vomiting, diarrhea, abdominal pain. In the ED, the patient was afebrile and hypotensive.  Oxygen saturation was 100% on room air.  WBC 8.0, hemoglobin 10.0, platelets 184,000.  Sodium 133, potassium 6.4, bicarbonate 20, serum creatinine is 4.24.  LFTs were unremarkable.  Troponin 6>>6>>6.  EKG showed atrial fibrillation with nonspecific T wave changes.  BNP 158.  CT of the brain was negative for any acute findings.  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  There was no cervical fractures, but showed severe central canal stenosis.   Assessment/Plan: Hyperkalemia -Give temporizing measures -Give Lokelma  Hypotension -Procalcitonin 0.1 -Cortisol elevated -Lactic acid -Continue IV fluids -Empiric IV antibiotics -Requiring oral midodrine and IV vasopressors -Was able to wean off of IV vasopressors in a.m. on 12/20 -Continue to monitor blood pressures  Generalized weakness -The patient has had numerous hospital admissions secondary to falls -Ultimate plan is to go home with hospice if he can be stabilized -PT evaluation  Acute on chronic renal  failure--CKD3a -baseline creatinine 1.2-1.5 -presented with serum creatinine 4.24 -due to hypotension and volume depletion -Currently creatinine is down to 3.2 -Would continue with IV fluids -Hold home furosemide  Atrial fibrillation -Having periods of tachycardia, but heart rate sitting in the low 100s -Rate control will be challenging with issues with low blood pressure -Overall pressures appear to be stabilizing, can consider restarting on low-dose metoprolol -He is anticoagulated with apixaban   Coronary artery disease -No chest pain presently -Status post CABG -Personally reviewed EKG--atrial fibrillation, nonspecific ST changes  Mixed hyperlipidemia -Continue statin  Chronic back pain -Restart hydrocodone  -Would avoid restarting gabapentin since he is having tremors  Bilateral tremors -Having difficulty holding a spoon to feed himself due to tremors -May be related to higher dose gabapentin in the setting of decreased renal clearance -Hold further gabapentin for now -Will also check ammonia  Goals of care -Patient is followed by hospice at home -DNR -Goal is to eventually discharge patient home with hospice services if he can stabilize    Family Communication: Updated sister at the bedside  Consultants:  palliative  Code Status:  DNR--confirmed with patient  DVT Prophylaxis:  [employer] Heparin   Procedures: As Listed in Progress Note Above  Antibiotics: Cefepime 12/19> Vancomycin 12/19 >    Subjective: He says he is feeling better today.  Denies any shortness of breath.  He does complain of bilateral upper extremity tremors   Objective: Vitals:   09/23/22 1400 09/23/22 1430 09/23/22 1630 09/23/22 1701  BP: (!) 106/47 (!) 83/46 113/76 113/78  Pulse: (!) 107 80 84 (!) 40  Resp: (!) 22 (!) 21 (!) 22 19  Temp:    98.1 F (36.7 C)  TempSrc:    Oral  SpO2: 97% 96% 98% 98%  Weight:      Height:        Intake/Output Summary (Last 24 hours) at  09/23/2022 1842 Last data filed at 09/23/2022 1823 Gross per 24 hour  Intake 961.88 ml  Output 850 ml  Net 111.88 ml   Weight change:  Exam:  General:  Pt is alert, follows commands appropriately, not in acute distress HEENT: No icterus, No thrush, No neck mass, [place]/AT Cardiovascular: Irregular, S1/S2, no rubs, no gallops Respiratory: bibasilar rales. No wheeze Abdomen: Soft/+BS, non tender, non distended, no guarding Extremities: No edema, No lymphangitis, No petechiae, No rashes, no synovitis   Data Reviewed: I have personally reviewed following labs and imaging studies Basic Metabolic Panel: Recent Labs  Lab 09/22/22 0429 09/22/22 0732 09/22/22 1114 09/22/22 1602 09/23/22 0332  NA 135 135 136 136 137  K 6.2* 5.7* 5.3* 5.3* 5.5*  CL 109 109 110 110 110  CO2 19* 18* 20* 19* 18*  GLUCOSE 105* 121* 137* 126* 171*  BUN 72* 70* 63* 67* 60*  CREATININE 4.07* 4.03* 3.68* 3.81* 3.21*  CALCIUM 8.2* 8.3* 7.9* 8.2* 8.4*   Liver Function Tests: Recent Labs  Lab 09/21/22 2141 09/23/22 0332  AST 26 22  ALT 10 10  ALKPHOS 73 71  BILITOT 0.8 0.8  PROT 6.5 6.2*  ALBUMIN 2.3* 2.2*   No results for input(s): "LIPASE", "AMYLASE" in the last 168 hours. No results for input(s): "AMMONIA" in the last 168 hours. Coagulation Profile: No results for input(s): "INR", "PROTIME" in the last 168 hours. CBC: Recent Labs  Lab 09/21/22 2141 09/22/22 0429 09/23/22 0332  WBC 8.0 6.1 10.1  NEUTROABS 6.7  --   --   HGB 10.0* 9.9* 10.5*  HCT 32.9* 32.7* 33.5*  MCV 95.1 95.9 93.3  PLT 184 143* 209   Cardiac Enzymes: No results for input(s): "CKTOTAL", "CKMB", "CKMBINDEX", "TROPONINI" in the last 168 hours. BNP: Invalid input(s): "POCBNP" CBG: Recent Labs  Lab 09/22/22 0016  GLUCAP 122*   HbA1C: No results for input(s): "HGBA1C" in the last 72 hours. Urine analysis:    Component Value Date/Time   COLORURINE yellow 05/14/2010 0900   APPEARANCEUR Clear 05/14/2010 0900    LABSPEC <1.005 05/14/2010 0900   PHURINE 6.0 05/14/2010 0900   HGBUR large 05/14/2010 0900   BILIRUBINUR negative 02/17/2021 1139   PROTEINUR Negative 02/17/2021 1139   UROBILINOGEN 0.2 02/17/2021 1139   UROBILINOGEN 0.2 05/14/2010 0900   NITRITE positive 02/17/2021 1139   NITRITE positive 05/14/2010 0900   LEUKOCYTESUR Large (3+) (A) 02/17/2021 1139   Sepsis Labs: '@LABRCNTIP'$ (procalcitonin:4,lacticidven:4) ) Recent Results (from the past 240 hour(s))  Resp panel by RT-PCR (RSV, Flu A&B, Covid) Anterior Nasal Swab     Status: None   Collection Time: 09/22/22  8:11 AM   Specimen: Anterior Nasal Swab  Result Value Ref Range Status   SARS Coronavirus 2 by RT PCR NEGATIVE NEGATIVE Final    Comment: (NOTE) SARS-CoV-2 target nucleic acids are NOT DETECTED.  The SARS-CoV-2 RNA is generally detectable in upper respiratory specimens during the acute phase of infection. The lowest concentration of SARS-CoV-2 viral copies this assay can detect is 138 copies/mL. A negative result does not preclude SARS-Cov-2 infection and should not be used as the sole basis for treatment or other patient management decisions. A negative result may occur with  improper specimen collection/handling, submission of specimen other than  nasopharyngeal swab, presence of viral mutation(s) within the areas targeted by this assay, and inadequate number of viral copies(<138 copies/mL). A negative result must be combined with clinical observations, patient history, and epidemiological information. The expected result is Negative.  Fact Sheet for Patients:  EntrepreneurPulse.com.au  Fact Sheet for Healthcare Providers:  IncredibleEmployment.be  This test is no t yet approved or cleared by the [place] FDA and  has been authorized for detection and/or diagnosis of SARS-CoV-2 by FDA under an Emergency Use Authorization (EUA). This EUA will remain  in effect (meaning this test  can be used) for the duration of the COVID-19 declaration under Section 564(b)(1) of the Act, 21 U.S.C.section 360bbb-3(b)(1), unless the authorization is terminated  or revoked sooner.       Influenza A by PCR NEGATIVE NEGATIVE Final   Influenza B by PCR NEGATIVE NEGATIVE Final    Comment: (NOTE) The Xpert Xpress SARS-CoV-2/FLU/RSV plus assay is intended as an aid in the diagnosis of influenza from Nasopharyngeal swab specimens and should not be used as a sole basis for treatment. Nasal washings and aspirates are unacceptable for Xpert Xpress SARS-CoV-2/FLU/RSV testing.  Fact Sheet for Patients: EntrepreneurPulse.com.au  Fact Sheet for Healthcare Providers: IncredibleEmployment.be  This test is not yet approved or cleared by the [place] FDA and has been authorized for detection and/or diagnosis of SARS-CoV-2 by FDA under an Emergency Use Authorization (EUA). This EUA will remain in effect (meaning this test can be used) for the duration of the COVID-19 declaration under Section 564(b)(1) of the Act, 21 U.S.C. section 360bbb-3(b)(1), unless the authorization is terminated or revoked.     Resp Syncytial Virus by PCR NEGATIVE NEGATIVE Final    Comment: (NOTE) Fact Sheet for Patients: EntrepreneurPulse.com.au  Fact Sheet for Healthcare Providers: IncredibleEmployment.be  This test is not yet approved or cleared by the [place] FDA and has been authorized for detection and/or diagnosis of SARS-CoV-2 by FDA under an Emergency Use Authorization (EUA). This EUA will remain in effect (meaning this test can be used) for the duration of the COVID-19 declaration under Section 564(b)(1) of the Act, 21 U.S.C. section 360bbb-3(b)(1), unless the authorization is terminated or revoked.  Performed at [facility], [address] [place], [place] [address]   Culture, blood (Routine X 2) w Reflex to ID  Panel     Status: None (Preliminary result)   Collection Time: 09/22/22  8:44 AM   Specimen: BLOOD  Result Value Ref Range Status   Specimen Description BLOOD BLOOD RIGHT ARM  Final   Special Requests   Final    BOTTLES DRAWN AEROBIC AND ANAEROBIC Blood Culture adequate volume BLOOD RIGHT ARM   Culture   Final    NO GROWTH 1 DAY Performed at [facility], [address], [place], [place] [address]    Report Status PENDING  Incomplete  Culture, blood (Routine X 2) w Reflex to ID Panel     Status: None (Preliminary result)   Collection Time: 09/22/22  8:44 AM   Specimen: BLOOD  Result Value Ref Range Status   Specimen Description BLOOD BLOOD LEFT ARM  Final   Special Requests   Final    BOTTLES DRAWN AEROBIC AND ANAEROBIC Blood Culture results may not be optimal due to an inadequate volume of blood received in culture bottles BLOOD LEFT ARM   Culture   Final    NO GROWTH 1 DAY Performed at [facility], [address], [place], [place] [address]    Report  Status PENDING  Incomplete  MRSA Next Gen by PCR, Nasal     Status: None   Collection Time: 09/22/22 11:05 AM   Specimen: Nasal Mucosa; Nasal Swab  Result Value Ref Range Status   MRSA by PCR Next Gen NOT DETECTED NOT DETECTED Final    Comment: (NOTE) The GeneXpert MRSA Assay (FDA approved for NASAL specimens only), is one component of a comprehensive MRSA colonization surveillance program. It is not intended to diagnose MRSA infection nor to guide or monitor treatment for MRSA infections. Test performance is not FDA approved in patients less than 35 years old. Performed at [facility], [address] [place], [place] [address]      Scheduled Meds:  apixaban  2.5 mg Oral BID   Chlorhexidine Gluconate Cloth  6 each Topical Daily   [START ON 09/24/2022] midodrine  5 mg Oral TID WC   pantoprazole  40 mg Oral Daily   rosuvastatin  20 mg Oral Daily   sodium bicarbonate  650 mg Oral TID   sodium zirconium cyclosilicate  10 g  Oral Daily   vancomycin variable dose per unstable renal function (pharmacist dosing)   Does not apply See admin instructions   Continuous Infusions:  sodium chloride 125 mL/hr at 09/23/22 1823   sodium chloride Stopped (09/22/22 1630)   ceFEPime (MAXIPIME) IV Stopped (09/23/22 1001)   norepinephrine (LEVOPHED) Adult infusion Stopped (09/23/22 0759)   [START ON 09/24/2022] vancomycin      Procedures/Studies: ECHOCARDIOGRAM COMPLETE  Result Date: 09/22/2022    ECHOCARDIOGRAM REPORT   Patient Name:   EZRIEL BOFFA Russey Date of Exam: 09/22/2022 Medical Rec #:  [id number]    Height:       73.0 in Accession #:    [id number]   Weight:       200.2 lb Date of Birth:  16-Nov-1940    BSA:          2.153 m Patient Age:    72 years     BP:           74/52 mmHg Patient Gender: M            HR:           104 bpm. Exam Location:  [facility] Procedure: 2D Echo, Cardiac Doppler, Color Doppler and Intracardiac            Opacification Agent Indications:    Atrial Fibrillation  History:        Patient has prior history of Echocardiogram examinations, most                 recent 08/14/2022. CHF, CAD, Prior CABG, Arrythmias:Atrial                 Fibrillation; Risk Factors:Hypertension and Dyslipidemia. Lung                 mass, Bladder CA, CKD.  Sonographer:    Wenda Low Referring Phys: [place]  1. Left ventricular ejection fraction, by estimation, is 55 to 60%. The left ventricle has normal function. The left ventricle has no regional wall motion abnormalities. Left ventricular diastolic function could not be evaluated.  2. Right ventricular systolic function is mildly reduced. The right ventricular size is moderately enlarged.  3. Left atrial size was mildly dilated.  4. Right atrial size was moderately dilated.  5. The mitral valve is grossly normal. Trivial mitral valve regurgitation. No evidence of mitral stenosis.  6. The tricuspid valve  is abnormal. Tricuspid valve regurgitation is moderate.  7.  The aortic valve is abnormal. There is moderate calcification of the aortic valve with moderate to severe restriction of leaflet mobility. Aortic valve regurgitation is not visualized. There is at least mild aortic valve stenosis visually. Aortic valve area, by VTI measures 1.36 cm. Aortic valve mean gradient measures 7.0 mmHg. Aortic valve Vmax measures 1.90 m/s. FINDINGS  Left Ventricle: Left ventricular ejection fraction, by estimation, is 55 to 60%. The left ventricle has normal function. The left ventricle has no regional wall motion abnormalities. Definity contrast agent was given IV to delineate the left ventricular  endocardial borders. The left ventricular internal cavity size was normal in size. There is no left ventricular hypertrophy. Left ventricular diastolic function could not be evaluated due to atrial fibrillation. Left ventricular diastolic function could  not be evaluated. Right Ventricle: The right ventricular size is moderately enlarged. No increase in right ventricular wall thickness. Right ventricular systolic function is mildly reduced. Left Atrium: Left atrial size was mildly dilated. Right Atrium: Right atrial size was moderately dilated. Pericardium: There is no evidence of pericardial effusion. Mitral Valve: The mitral valve is grossly normal. Trivial mitral valve regurgitation. No evidence of mitral valve stenosis. MV peak gradient, 3.4 mmHg. The mean mitral valve gradient is 1.0 mmHg. Tricuspid Valve: The tricuspid valve is abnormal. Tricuspid valve regurgitation is moderate . No evidence of tricuspid stenosis. Aortic Valve: The aortic valve is abnormal. There is moderate calcification of the aortic valve. Aortic valve regurgitation is not visualized. Mild aortic stenosis is present. Aortic valve mean gradient measures 7.0 mmHg. Aortic valve peak gradient measures 14.4 mmHg. Aortic valve area, by VTI measures 1.53 cm. Pulmonic Valve: The pulmonic valve was not well visualized.  Pulmonic valve regurgitation is not visualized. No evidence of pulmonic stenosis. Aorta: The aortic root is normal in size and structure. Venous: The inferior vena cava was not well visualized. IAS/Shunts: The interatrial septum was not assessed.  LEFT VENTRICLE PLAX 2D LVIDd:         4.30 cm LVIDs:         2.80 cm LV PW:         0.90 cm LV IVS:        1.00 cm LVOT diam:     2.00 cm LV SV:         56 LV SV Index:   26 LVOT Area:     3.14 cm  RIGHT VENTRICLE RV Basal diam:  4.20 cm RV Mid diam:    2.70 cm LEFT ATRIUM             Index        RIGHT ATRIUM           Index LA diam:        4.30 cm 2.00 cm/m   RA Area:     23.60 cm LA Vol (A2C):   87.9 ml 40.84 ml/m  RA Volume:   76.20 ml  35.40 ml/m LA Vol (A4C):   77.6 ml 36.05 ml/m LA Biplane Vol: 84.2 ml 39.12 ml/m  AORTIC VALVE                     PULMONIC VALVE AV Area (Vmax):    1.40 cm      PV Vmax:       1.10 m/s AV Area (Vmean):   1.37 cm      PV Peak grad:  4.8 mmHg AV Area (VTI):  1.53 cm AV Vmax:           190.00 cm/s AV Vmean:          120.500 cm/s AV VTI:            0.366 m AV Peak Grad:      14.4 mmHg AV Mean Grad:      7.0 mmHg LVOT Vmax:         84.55 cm/s LVOT Vmean:        52.650 cm/s LVOT VTI:          0.178 m LVOT/AV VTI ratio: 0.49  AORTA Ao Root diam: 3.40 cm MITRAL VALVE               TRICUSPID VALVE MV Area (PHT): 9.98 cm    TR Peak grad:   35.0 mmHg MV Area VTI:   2.82 cm    TR Vmax:        296.00 cm/s MV Peak grad:  3.4 mmHg MV Mean grad:  1.0 mmHg    SHUNTS MV Vmax:       0.92 m/s    Systemic VTI:  0.18 m MV Vmean:      41.2 cm/s   Systemic Diam: 2.00 cm MV Decel Time: 76 msec MV E velocity: 69.00 cm/s Vishnu Priya Mallipeddi Electronically signed by Lorelee Cover Mallipeddi Signature Date/Time: 09/22/2022/3:14:42 PM    Final    US RENAL  Result Date: 09/22/2022 CLINICAL DATA:  Acute renal failure. EXAM: RENAL / URINARY TRACT ULTRASOUND COMPLETE COMPARISON:  CT abdomen and pelvis 08/16/2018 FINDINGS: Right Kidney: Renal  measurements: 11.5 x 6.6 x 6.5 cm = volume: 256 mL. Limited assessment due to body habitus and bowel. No gross hydronephrosis. Left Kidney: Renal measurements: 11.0 x 5.7 x 5.0 cm = volume: 166 mL. Echogenicity within normal limits. No mass or hydronephrosis visualized. Bladder: Appears normal for degree of bladder distention. Other: Incidental note of shadowing stones in the gallbladder. IMPRESSION: 1. Poor visualization of the right kidney. 2. No hydronephrosis. 3. Cholelithiasis. Electronically Signed   By: Logan Bores M.D.   On: 09/22/2022 10:36   CT Head Wo Contrast  Addendum Date: 09/22/2022   ADDENDUM REPORT: 09/22/2022 00:33 ADDENDUM: Please note findings should state partially visualized heterogeneous oval mass noted inferior to the right thyroid gland. Finding better evaluated on CT chest 08/13/2022 and ultrasound thyroid 08/13/2022. This has been evaluated on previous imaging. (ref: J Am Coll Radiol. 2015 Feb;12(2): 143-50). Electronically Signed   By: Iven Finn M.D.   On: 09/22/2022 00:33   Result Date: 09/22/2022 CLINICAL DATA:  Head trauma, minor (Age >= 65y); Neck trauma (Age >= 65y). Pt with a fall and laceration to L side of forehead. Pt is on blood thinners. Not complaining of any pain. EXAM: CT HEAD WITHOUT CONTRAST CT CERVICAL SPINE WITHOUT CONTRAST TECHNIQUE: Multidetector CT imaging of the head and cervical spine was performed following the standard protocol without intravenous contrast. Multiplanar CT image reconstructions of the cervical spine were also generated. RADIATION DOSE REDUCTION: This exam was performed according to the departmental dose-optimization program which includes automated exposure control, adjustment of the mA and/or kV according to patient size and/or use of iterative reconstruction technique. COMPARISON:  MRI head 04/03/2022 FINDINGS: CT HEAD FINDINGS Brain: Patchy and confluent areas of decreased attenuation are noted throughout the deep and  periventricular white matter of the cerebral hemispheres bilaterally, compatible with chronic microvascular ischemic disease. No evidence of large-territorial acute infarction. No parenchymal hemorrhage. No  mass lesion. No extra-axial collection. No mass effect or midline shift. No hydrocephalus. Basilar cisterns are patent. Vascular: No hyperdense vessel. Atherosclerotic calcifications are present within the cavernous internal carotid arteries. Skull: No acute fracture or focal lesion. Sinuses/Orbits: Right maxillary sinus mucosal thickening. Associated calcification. Right maxillary sinus wall hypertrophy. Paranasal sinuses and mastoid air cells are clear. Bilateral lens replacement. Otherwise the orbits are unremarkable. Other: No large hematoma formation. CT CERVICAL SPINE FINDINGS Alignment: Normal. Skull base and vertebrae: Multilevel moderate severe degenerative changes spine most prominent at the C4 through C7 levels. Associated bulky posterior osteophyte complex formations in likely PLL calcification. Associated multilevel severe osseous neural foraminal and central canal stenosis. No acute fracture. No aggressive appearing focal osseous lesion or focal pathologic process. Soft tissues and spinal canal: No prevertebral fluid or swelling. No visible canal hematoma. Upper chest: Biapical assess for the spine biapical pleural/pulmonary scarring. Other: None. IMPRESSION: 1. No acute intracranial abnormality. 2. No acute displaced fracture or traumatic listhesis of the cervical spine. 3. 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 4. Chronic right maxillary sinusitis. Electronically Signed: By: Iven Finn M.D. On: 09/21/2022 22:42   CT Cervical Spine Wo Contrast  Addendum Date: 09/22/2022   ADDENDUM REPORT: 09/22/2022 00:33 ADDENDUM: Please note findings should state partially visualized heterogeneous oval mass noted inferior to the right thyroid  gland. Finding better evaluated on CT chest 08/13/2022 and ultrasound thyroid 08/13/2022. This has been evaluated on previous imaging. (ref: J Am Coll Radiol. 2015 Feb;12(2): 143-50). Electronically Signed   By: Iven Finn M.D.   On: 09/22/2022 00:33   Result Date: 09/22/2022 CLINICAL DATA:  Head trauma, minor (Age >= 65y); Neck trauma (Age >= 65y). Pt with a fall and laceration to L side of forehead. Pt is on blood thinners. Not complaining of any pain. EXAM: CT HEAD WITHOUT CONTRAST CT CERVICAL SPINE WITHOUT CONTRAST TECHNIQUE: Multidetector CT imaging of the head and cervical spine was performed following the standard protocol without intravenous contrast. Multiplanar CT image reconstructions of the cervical spine were also generated. RADIATION DOSE REDUCTION: This exam was performed according to the departmental dose-optimization program which includes automated exposure control, adjustment of the mA and/or kV according to patient size and/or use of iterative reconstruction technique. COMPARISON:  MRI head 04/03/2022 FINDINGS: CT HEAD FINDINGS Brain: Patchy and confluent areas of decreased attenuation are noted throughout the deep and periventricular white matter of the cerebral hemispheres bilaterally, compatible with chronic microvascular ischemic disease. No evidence of large-territorial acute infarction. No parenchymal hemorrhage. No mass lesion. No extra-axial collection. No mass effect or midline shift. No hydrocephalus. Basilar cisterns are patent. Vascular: No hyperdense vessel. Atherosclerotic calcifications are present within the cavernous internal carotid arteries. Skull: No acute fracture or focal lesion. Sinuses/Orbits: Right maxillary sinus mucosal thickening. Associated calcification. Right maxillary sinus wall hypertrophy. Paranasal sinuses and mastoid air cells are clear. Bilateral lens replacement. Otherwise the orbits are unremarkable. Other: No large hematoma formation. CT CERVICAL  SPINE FINDINGS Alignment: Normal. Skull base and vertebrae: Multilevel moderate severe degenerative changes spine most prominent at the C4 through C7 levels. Associated bulky posterior osteophyte complex formations in likely PLL calcification. Associated multilevel severe osseous neural foraminal and central canal stenosis. No acute fracture. No aggressive appearing focal osseous lesion or focal pathologic process. Soft tissues and spinal canal: No prevertebral fluid or swelling. No visible canal hematoma. Upper chest: Biapical assess for the spine biapical pleural/pulmonary scarring. Other: None. IMPRESSION: 1. No acute intracranial  abnormality. 2. No acute displaced fracture or traumatic listhesis of the cervical spine. 3. 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 4. Chronic right maxillary sinusitis. Electronically Signed: By: Iven Finn M.D. On: 09/21/2022 22:42   DG Hip Unilat With Pelvis 2-3 Views Right  Result Date: 09/21/2022 CLINICAL DATA:  Fall EXAM: DG HIP (WITH OR WITHOUT PELVIS) 2-3V RIGHT COMPARISON:  None Available. FINDINGS: No fracture or dislocation is seen. Bilateral hip joint spaces are preserved. Visualized bony pelvis is intact. Degenerative changes of the lower lumbar spine. IMPRESSION: Negative. Electronically Signed   By: Julian Hy M.D.   On: 09/21/2022 22:54   DG Knee Complete 4 Views Right  Result Date: 09/21/2022 CLINICAL DATA:  Fall EXAM: RIGHT KNEE - COMPLETE 4+ VIEW COMPARISON:  None Available. FINDINGS: No fracture or dislocation is seen. The joint spaces are preserved. Visualized soft tissues are within normal limits. No suprapatellar knee joint effusion. IMPRESSION: Negative. Electronically Signed   By: Julian Hy M.D.   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 Mild left basilar opacity, favoring atelectasis. No pleural effusion or pneumothorax. The heart is normal in size. Postsurgical changes related to prior CABG. Median sternotomy. 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 Electronically Signed   By: Julian Hy M.D.   On: 09/21/2022 22:54    Kathie Dike, MD  [facility]  If 7PM-7AM, please contact night-coverage [email or web address]  09/23/2022, 6:42 PM   LOS: 1 day

## 2022-09-23 NOTE — Progress Notes (Signed)
     Referral received for Rodney Barnett for goals of care discussion. Chart reviewed and updates received from RN. Patient is already established with [facility] TOC has notified them of admissions.  Spoke with [facility] rep on the floor.  Their nurse coming today to see the patient. Discussed with Dr. Roderic Palau that [facility]ney Barnett will manage the patient while in the hospital. We are available as needed for symptom management while here (especially if the patient transitions to comfort care as inpatient).   Please notify us if we are needed moving forward in the care of this patient.  Thank you for your referral and allowing PMT to assist in Rodney Barnett's care.   Walden Field, NP Palliative Medicine Team Phone: [phone]  NO CHARGE

## 2022-09-23 NOTE — Progress Notes (Signed)
Pt brother, Sonia Side, given update at this time

## 2022-09-24 ENCOUNTER — Inpatient Hospital Stay (HOSPITAL_COMMUNITY)

## 2022-09-24 DIAGNOSIS — E782 Mixed hyperlipidemia: Secondary | ICD-10-CM | POA: Diagnosis not present

## 2022-09-24 DIAGNOSIS — E875 Hyperkalemia: Secondary | ICD-10-CM | POA: Diagnosis not present

## 2022-09-24 DIAGNOSIS — E871 Hypo-osmolality and hyponatremia: Secondary | ICD-10-CM | POA: Diagnosis not present

## 2022-09-24 DIAGNOSIS — N179 Acute kidney failure, unspecified: Secondary | ICD-10-CM | POA: Diagnosis not present

## 2022-09-24 LAB — CBC
HCT: 31.9 % — ABNORMAL LOW (ref 39.0–52.0)
Hemoglobin: 9.7 g/dL — ABNORMAL LOW (ref 13.0–17.0)
MCH: 28.7 pg (ref 26.0–34.0)
MCHC: 30.4 g/dL (ref 30.0–36.0)
MCV: 94.4 fL (ref 80.0–100.0)
Platelets: 159 10*3/uL (ref 150–400)
RBC: 3.38 MIL/uL — ABNORMAL LOW (ref 4.22–5.81)
RDW: 17.3 % — ABNORMAL HIGH (ref 11.5–15.5)
WBC: 6.5 10*3/uL (ref 4.0–10.5)
nRBC: 0 % (ref 0.0–0.2)

## 2022-09-24 LAB — AMMONIA: Ammonia: 25 umol/L (ref 9–35)

## 2022-09-24 LAB — BASIC METABOLIC PANEL
Anion gap: 9 (ref 5–15)
BUN: 53 mg/dL — ABNORMAL HIGH (ref 8–23)
CO2: 19 mmol/L — ABNORMAL LOW (ref 22–32)
Calcium: 8.7 mg/dL — ABNORMAL LOW (ref 8.9–10.3)
Chloride: 113 mmol/L — ABNORMAL HIGH (ref 98–111)
Creatinine, Ser: 2.44 mg/dL — ABNORMAL HIGH (ref 0.61–1.24)
GFR, Estimated: 26 mL/min — ABNORMAL LOW (ref >60)
Glucose, Bld: 101 mg/dL — ABNORMAL HIGH (ref 70–99)
Potassium: 4.1 mmol/L (ref 3.5–5.1)
Sodium: 141 mmol/L (ref 135–145)

## 2022-09-24 MED ORDER — METOPROLOL TARTRATE 5 MG/5ML IV SOLN
5.0000 mg | Freq: Once | INTRAVENOUS | Status: AC
  Administered 2022-09-24: 5 mg via INTRAVENOUS
  Filled 2022-09-24: qty 5

## 2022-09-24 MED ORDER — METOPROLOL TARTRATE 5 MG/5ML IV SOLN
2.5000 mg | Freq: Once | INTRAVENOUS | Status: AC
  Administered 2022-09-24: 2.5 mg via INTRAVENOUS
  Filled 2022-09-24: qty 5

## 2022-09-24 MED ORDER — FUROSEMIDE 10 MG/ML IJ SOLN
20.0000 mg | Freq: Once | INTRAMUSCULAR | Status: AC
  Administered 2022-09-24: 20 mg via INTRAVENOUS
  Filled 2022-09-24: qty 2

## 2022-09-24 MED ORDER — METOPROLOL TARTRATE 25 MG PO TABS
25.0000 mg | ORAL_TABLET | Freq: Two times a day (BID) | ORAL | Status: DC
  Administered 2022-09-24 – 2022-09-25 (×3): 25 mg via ORAL
  Filled 2022-09-24 (×3): qty 1

## 2022-09-24 MED ORDER — DILTIAZEM HCL-DEXTROSE 125-5 MG/125ML-% IV SOLN (PREMIX)
INTRAVENOUS | Status: AC
  Administered 2022-09-24: 5 mg/h via INTRAVENOUS
  Filled 2022-09-24: qty 125

## 2022-09-24 MED ORDER — DILTIAZEM HCL-DEXTROSE 125-5 MG/125ML-% IV SOLN (PREMIX): 5.0000 mg/h | INTRAVENOUS | Status: DC

## 2022-09-24 MED ORDER — SODIUM CHLORIDE 0.9 % IV BOLUS
500.0000 mL | Freq: Once | INTRAVENOUS | Status: AC
  Administered 2022-09-24: 500 mL via INTRAVENOUS

## 2022-09-24 NOTE — Progress Notes (Signed)
     Referral previously received for Rodney Barnett [facility] for goals of care discussion. Noted most recent palliative in-person assessment dated 09/23/2022 at which time it was recommended to follow from a distance/chart check.   Chart reviewed for Recent provider notes, nurse notes, TOC notes, vitals, labs, and imaging and updates received from RN.   At this time patient appears stable. Hospice following as inpatient, PMT available as needed for symptom management. No plan for in person follow-up at this time.   We will continue to follow peripherally. Please contact the palliative medicine provider on service for any new/urgent needs that require our assistance with this patient.  Thank you for your referral and allowing PMT to assist in Rodney Barnett's care.   Walden Field, NP Palliative Medicine Team Phone: [phone]  NO CHARGE

## 2022-09-24 NOTE — Progress Notes (Signed)
At around 0500 in the morning, Pt's back on Afib RVR on monitor, EKG done. HR sustaining to 130-180s. Asymptomatic and just lying in bed comfortably. MD notified and ordered bolus. Given low dose Metoprolol IV too. HR now at 110-130s. Kept monitored.

## 2022-09-24 NOTE — Progress Notes (Signed)
PROGRESS NOTE  Rodney Barnett EXB:[id number] DOB: 19-Nov-1940 DOA: 09/21/2022 PCP: [name], MD  Brief History:  81 year old male with a history of coronary artery disease status post CABG, bladder cancer status post TURBT, atrial fibrillation, GI bleed, chronic back pain, COPD, hypertension, hyperlipidemia presenting after a fall at home.  Patient was on the commode when he was trying to get up and fell forward and hit his head.  The patient denies syncope.  He states that his oral intake has been poor.  He lives alone with his grandson next-door.  The patient denies any new medications.  He denies any focal extremity weakness, dysarthria, visual disturbance.  He has generalized weakness.  He denies any fevers, chills, chest pain, shortness breath, coughing, hemoptysis, nausea, vomiting, diarrhea, abdominal pain. In the ED, the patient was afebrile and hypotensive.  Oxygen saturation was 100% on room air.  WBC 8.0, hemoglobin 10.0, platelets 184,000.  Sodium 133, potassium 6.4, bicarbonate 20, serum creatinine is 4.24.  LFTs were unremarkable.  Troponin 6>>6>>6.  EKG showed atrial fibrillation with nonspecific T wave changes.  BNP 158.  CT of the brain was negative for any acute findings.  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  There was no cervical fractures, but showed severe central canal stenosis.   Assessment/Plan: Hyperkalemia -Improved with Lokelma  Hypotension -Procalcitonin 0.1 -Cortisol elevated -Lactic acid 2.2>>1.2 -Discontinue antibiotics, no signs of sepsis -Requiring oral midodrine and IV vasopressors -Was able to wean off of IV vasopressors in a.m. on 12/20 -Continue to monitor blood pressures  Generalized weakness -The patient has had numerous hospital admissions secondary to falls -Ultimate plan is to go home with hospice if he can be stabilized -PT evaluation completed with recommendations for home  health -Will need to discuss with family/hospice how they will support him at home  Acute on chronic renal failure--CKD3a -baseline creatinine 1.2-1.5 -presented with serum creatinine 4.24 -due to hypotension and volume depletion -Currently creatinine is down to 2.4 -Hold further IV fluids since he does appear to be developing volume overload  Acute on chronic diastolic congestive heart failure -Precipitated by IV fluid hydration for renal failure -Echo shows normal EF -Since overall blood pressure is better, will give 1 dose of Lasix  Atrial fibrillation with RVR -Restarted on home dose metoprolol, but still having persistent tachycardia -Overall blood pressures have improved -Will start on Cardizem infusion to help control heart rate -He is anticoagulated with apixaban   Coronary artery disease -No chest pain presently -Status post CABG -Personally reviewed EKG--atrial fibrillation, nonspecific ST changes  Mixed hyperlipidemia -Continue statin  Chronic back pain -Restart hydrocodone  -Would avoid restarting gabapentin since he is having tremors  Bilateral tremors -Having difficulty holding a spoon to feed himself due to tremors -May be related to higher dose gabapentin in the setting of decreased renal clearance -Hold further gabapentin for now -Ammonia normal  Goals of care -Patient is followed by hospice at home -DNR -Goal is to eventually discharge patient home with hospice services if he can stabilize    Family Communication: No family present today.  Unable to reach family who is listed on chart over the phone  Consultants:  palliative  Code Status:  DNR--confirmed with patient  DVT Prophylaxis:  [place] Heparin   Procedures: As Listed in Progress Note Above  Antibiotics: Cefepime 12/19> 12/21 Vancomycin 12/19 > 12/21    Subjective: He says  he is feeling better and wants to go home.  Noted to be in rapid atrial fibrillation.   Objective: Vitals:    09/24/22 1500 09/24/22 1600 09/24/22 1700 09/24/22 1800  BP: 123/69  (!) 122/96 121/72  Pulse: (!) 110 (!) 114 (!) 106 (!) 147  Resp: (!) 32 20 (!) 37 (!) 30  Temp:  97.8 F (36.6 C)    TempSrc:  Oral    SpO2: (!) 89% 97% 92% (!) 84%  Weight:      Height:        Intake/Output Summary (Last 24 hours) at 09/24/2022 1830 Last data filed at 09/24/2022 1236 Gross per 24 hour  Intake 2107.35 ml  Output 400 ml  Net 1707.35 ml   Weight change: -2.71 kg Exam:  General:  Pt is alert, follows commands appropriately, not in acute distress HEENT: No icterus, No thrush, No neck mass, [place]/AT Cardiovascular: Irregular, S1/S2, no rubs, no gallops Respiratory: bibasilar rales. No wheeze, increased respiratory effort Abdomen: Soft/+BS, non tender, non distended, no guarding Extremities: 1+ edema, No lymphangitis, No petechiae, No rashes, no synovitis   Data Reviewed: I have personally reviewed following labs and imaging studies Basic Metabolic Panel: Recent Labs  Lab 09/22/22 0732 09/22/22 1114 09/22/22 1602 09/23/22 0332 09/24/22 0410  NA 135 136 136 137 141  K 5.7* 5.3* 5.3* 5.5* 4.1  CL 109 110 110 110 113*  CO2 18* 20* 19* 18* 19*  GLUCOSE 121* 137* 126* 171* 101*  BUN 70* 63* 67* 60* 53*  CREATININE 4.03* 3.68* 3.81* 3.21* 2.44*  CALCIUM 8.3* 7.9* 8.2* 8.4* 8.7*   Liver Function Tests: Recent Labs  Lab 09/21/22 2141 09/23/22 0332  AST 26 22  ALT 10 10  ALKPHOS 73 71  BILITOT 0.8 0.8  PROT 6.5 6.2*  ALBUMIN 2.3* 2.2*   No results for input(s): "LIPASE", "AMYLASE" in the last 168 hours. Recent Labs  Lab 09/24/22 0410  AMMONIA 25   Coagulation Profile: No results for input(s): "INR", "PROTIME" in the last 168 hours. CBC: Recent Labs  Lab 09/21/22 2141 09/22/22 0429 09/23/22 0332 09/24/22 0410  WBC 8.0 6.1 10.1 6.5  NEUTROABS 6.7  --   --   --   HGB 10.0* 9.9* 10.5* 9.7*  HCT 32.9* 32.7* 33.5* 31.9*  MCV 95.1 95.9 93.3 94.4  PLT 184 143* 209 159    Cardiac Enzymes: No results for input(s): "CKTOTAL", "CKMB", "CKMBINDEX", "TROPONINI" in the last 168 hours. BNP: Invalid input(s): "POCBNP" CBG: Recent Labs  Lab 09/22/22 0016  GLUCAP 122*   HbA1C: No results for input(s): "HGBA1C" in the last 72 hours. Urine analysis:    Component Value Date/Time   COLORURINE yellow 05/14/2010 0900   APPEARANCEUR Clear 05/14/2010 0900   LABSPEC <1.005 05/14/2010 0900   PHURINE 6.0 05/14/2010 0900   HGBUR large 05/14/2010 0900   BILIRUBINUR negative 02/17/2021 1139   PROTEINUR Negative 02/17/2021 1139   UROBILINOGEN 0.2 02/17/2021 1139   UROBILINOGEN 0.2 05/14/2010 0900   NITRITE positive 02/17/2021 1139   NITRITE positive 05/14/2010 0900   LEUKOCYTESUR Large (3+) (A) 02/17/2021 1139   Sepsis Labs: '@LABRCNTIP'$ (procalcitonin:4,lacticidven:4) ) Recent Results (from the past 240 hour(s))  Resp panel by RT-PCR (RSV, Flu A&B, Covid) Anterior Nasal Swab     Status: None   Collection Time: 09/22/22  8:11 AM   Specimen: Anterior Nasal Swab  Result Value Ref Range Status   SARS Coronavirus 2 by RT PCR NEGATIVE NEGATIVE Final    Comment: (NOTE) SARS-CoV-2 target  nucleic acids are NOT DETECTED.  The SARS-CoV-2 RNA is generally detectable in upper respiratory specimens during the acute phase of infection. The lowest concentration of SARS-CoV-2 viral copies this assay can detect is 138 copies/mL. A negative result does not preclude SARS-Cov-2 infection and should not be used as the sole basis for treatment or other patient management decisions. A negative result may occur with  improper specimen collection/handling, submission of specimen other than nasopharyngeal swab, presence of viral mutation(s) within the areas targeted by this assay, and inadequate number of viral copies(<138 copies/mL). A negative result must be combined with clinical observations, patient history, and epidemiological information. The expected result is  Negative.  Fact Sheet for Patients:  EntrepreneurPulse.com.au  Fact Sheet for Healthcare Providers:  IncredibleEmployment.be  This test is no t yet approved or cleared by the [place] FDA and  has been authorized for detection and/or diagnosis of SARS-CoV-2 by FDA under an Emergency Use Authorization (EUA). This EUA will remain  in effect (meaning this test can be used) for the duration of the COVID-19 declaration under Section 564(b)(1) of the Act, 21 U.S.C.section 360bbb-3(b)(1), unless the authorization is terminated  or revoked sooner.       Influenza A by PCR NEGATIVE NEGATIVE Final   Influenza B by PCR NEGATIVE NEGATIVE Final    Comment: (NOTE) The Xpert Xpress SARS-CoV-2/FLU/RSV plus assay is intended as an aid in the diagnosis of influenza from Nasopharyngeal swab specimens and should not be used as a sole basis for treatment. Nasal washings and aspirates are unacceptable for Xpert Xpress SARS-CoV-2/FLU/RSV testing.  Fact Sheet for Patients: EntrepreneurPulse.com.au  Fact Sheet for Healthcare Providers: IncredibleEmployment.be  This test is not yet approved or cleared by the [place] FDA and has been authorized for detection and/or diagnosis of SARS-CoV-2 by FDA under an Emergency Use Authorization (EUA). This EUA will remain in effect (meaning this test can be used) for the duration of the COVID-19 declaration under Section 564(b)(1) of the Act, 21 U.S.C. section 360bbb-3(b)(1), unless the authorization is terminated or revoked.     Resp Syncytial Virus by PCR NEGATIVE NEGATIVE Final    Comment: (NOTE) Fact Sheet for Patients: EntrepreneurPulse.com.au  Fact Sheet for Healthcare Providers: IncredibleEmployment.be  This test is not yet approved or cleared by the [place] FDA and has been authorized for detection and/or diagnosis of  SARS-CoV-2 by FDA under an Emergency Use Authorization (EUA). This EUA will remain in effect (meaning this test can be used) for the duration of the COVID-19 declaration under Section 564(b)(1) of the Act, 21 U.S.C. section 360bbb-3(b)(1), unless the authorization is terminated or revoked.  Performed at [facility], [address], [place], [place] [address]   Culture, blood (Routine X 2) w Reflex to ID Panel     Status: None (Preliminary result)   Collection Time: 09/22/22  8:44 AM   Specimen: BLOOD  Result Value Ref Range Status   Specimen Description BLOOD BLOOD RIGHT ARM  Final   Special Requests   Final    BOTTLES DRAWN AEROBIC AND ANAEROBIC Blood Culture adequate volume BLOOD RIGHT ARM   Culture   Final    NO GROWTH 2 DAYS Performed at [facility], [address], [place], [place] [address]    Report Status PENDING  Incomplete  Culture, blood (Routine X 2) w Reflex to ID Panel     Status: None (Preliminary result)   Collection Time: 09/22/22  8:44 AM   Specimen: BLOOD  Result Value Ref Range  Status   Specimen Description BLOOD BLOOD LEFT ARM  Final   Special Requests   Final    BOTTLES DRAWN AEROBIC AND ANAEROBIC Blood Culture results may not be optimal due to an inadequate volume of blood received in culture bottles BLOOD LEFT ARM   Culture   Final    NO GROWTH 2 DAYS Performed at [facility], [address] [place], [place] [address]    Report Status PENDING  Incomplete  MRSA Next Gen by PCR, Nasal     Status: None   Collection Time: 09/22/22 11:05 AM   Specimen: Nasal Mucosa; Nasal Swab  Result Value Ref Range Status   MRSA by PCR Next Gen NOT DETECTED NOT DETECTED Final    Comment: (NOTE) The GeneXpert MRSA Assay (FDA approved for NASAL specimens only), is one component of a comprehensive MRSA colonization surveillance program. It is not intended to diagnose MRSA infection nor to guide or monitor treatment for MRSA infections. Test performance is not FDA approved  in patients less than 69 years old. Performed at [facility], [address] [place], [place] [address]      Scheduled Meds:  apixaban  2.5 mg Oral BID   Chlorhexidine Gluconate Cloth  6 each Topical Daily   dilTIAZem HCl-Dextrose       metoprolol tartrate  25 mg Oral BID   midodrine  5 mg Oral TID WC   pantoprazole  40 mg Oral Daily   rosuvastatin  20 mg Oral Daily   sodium bicarbonate  650 mg Oral TID   vancomycin variable dose per unstable renal function (pharmacist dosing)   Does not apply See admin instructions   Continuous Infusions:  sodium chloride Stopped (09/22/22 1630)   diltiazem (CARDIZEM) infusion      Procedures/Studies: DG CHEST PORT 1 VIEW  Result Date: 09/24/2022 CLINICAL DATA:  Shortness of breath, AFib EXAM: PORTABLE CHEST 1 VIEW COMPARISON:  09/21/2022 FINDINGS: Cardiomegaly/post median sternotomy and CABG. Dense, masslike consolidation of the medial infrahilar right lung. Diffuse bilateral interstitial pulmonary opacity. Osseous structures unremarkable. IMPRESSION: 1. Dense, masslike consolidation of the medial infrahilar right lung, better assessed by recent prior CT. 2. Diffuse bilateral interstitial pulmonary opacity, consistent with edema. 3. Cardiomegaly/post median sternotomy and CABG. Electronically Signed   By: Delanna Ahmadi M.D.   On: 09/24/2022 15:34   ECHOCARDIOGRAM COMPLETE  Result Date: 09/22/2022    ECHOCARDIOGRAM REPORT   Patient Name:   RECE ZECHMAN Date of Exam: 09/22/2022 Medical Rec #:  [id number]    Height:       73.0 in Accession #:    [id number]   Weight:       200.2 lb Date of Birth:  [birth year]    BSA:          2.153 m Patient Age:    74 years     BP:           74/52 mmHg Patient Gender: M            HR:           104 bpm. Exam Location:  [facility] Procedure: 2D Echo, Cardiac Doppler, Color Doppler and Intracardiac            Opacification Agent Indications:    Atrial Fibrillation  History:        Patient has prior history of Echocardiogram  examinations, most                 recent 08/14/2022. CHF,  CAD, Prior CABG, Arrythmias:Atrial                 Fibrillation; Risk Factors:Hypertension and Dyslipidemia. Lung                 mass, Bladder CA, CKD.  Sonographer:    Wenda Low Referring Phys: [place]  1. Left ventricular ejection fraction, by estimation, is 55 to 60%. The left ventricle has normal function. The left ventricle has no regional wall motion abnormalities. Left ventricular diastolic function could not be evaluated.  2. Right ventricular systolic function is mildly reduced. The right ventricular size is moderately enlarged.  3. Left atrial size was mildly dilated.  4. Right atrial size was moderately dilated.  5. The mitral valve is grossly normal. Trivial mitral valve regurgitation. No evidence of mitral stenosis.  6. The tricuspid valve is abnormal. Tricuspid valve regurgitation is moderate.  7. The aortic valve is abnormal. There is moderate calcification of the aortic valve with moderate to severe restriction of leaflet mobility. Aortic valve regurgitation is not visualized. There is at least mild aortic valve stenosis visually. Aortic valve area, by VTI measures 1.36 cm. Aortic valve mean gradient measures 7.0 mmHg. Aortic valve Vmax measures 1.90 m/s. FINDINGS  Left Ventricle: Left ventricular ejection fraction, by estimation, is 55 to 60%. The left ventricle has normal function. The left ventricle has no regional wall motion abnormalities. Definity contrast agent was given IV to delineate the left ventricular  endocardial borders. The left ventricular internal cavity size was normal in size. There is no left ventricular hypertrophy. Left ventricular diastolic function could not be evaluated due to atrial fibrillation. Left ventricular diastolic function could  not be evaluated. Right Ventricle: The right ventricular size is moderately enlarged. No increase in right ventricular wall thickness. Right ventricular  systolic function is mildly reduced. Left Atrium: Left atrial size was mildly dilated. Right Atrium: Right atrial size was moderately dilated. Pericardium: There is no evidence of pericardial effusion. Mitral Valve: The mitral valve is grossly normal. Trivial mitral valve regurgitation. No evidence of mitral valve stenosis. MV peak gradient, 3.4 mmHg. The mean mitral valve gradient is 1.0 mmHg. Tricuspid Valve: The tricuspid valve is abnormal. Tricuspid valve regurgitation is moderate . No evidence of tricuspid stenosis. Aortic Valve: The aortic valve is abnormal. There is moderate calcification of the aortic valve. Aortic valve regurgitation is not visualized. Mild aortic stenosis is present. Aortic valve mean gradient measures 7.0 mmHg. Aortic valve peak gradient measures 14.4 mmHg. Aortic valve area, by VTI measures 1.53 cm. Pulmonic Valve: The pulmonic valve was not well visualized. Pulmonic valve regurgitation is not visualized. No evidence of pulmonic stenosis. Aorta: The aortic root is normal in size and structure. Venous: The inferior vena cava was not well visualized. IAS/Shunts: The interatrial septum was not assessed.  LEFT VENTRICLE PLAX 2D LVIDd:         4.30 cm LVIDs:         2.80 cm LV PW:         0.90 cm LV IVS:        1.00 cm LVOT diam:     2.00 cm LV SV:         56 LV SV Index:   26 LVOT Area:     3.14 cm  RIGHT VENTRICLE RV Basal diam:  4.20 cm RV Mid diam:    2.70 cm LEFT ATRIUM             Index  RIGHT ATRIUM           Index LA diam:        4.30 cm 2.00 cm/m   RA Area:     23.60 cm LA Vol (A2C):   87.9 ml 40.84 ml/m  RA Volume:   76.20 ml  35.40 ml/m LA Vol (A4C):   77.6 ml 36.05 ml/m LA Biplane Vol: 84.2 ml 39.12 ml/m  AORTIC VALVE                     PULMONIC VALVE AV Area (Vmax):    1.40 cm      PV Vmax:       1.10 m/s AV Area (Vmean):   1.37 cm      PV Peak grad:  4.8 mmHg AV Area (VTI):     1.53 cm AV Vmax:           190.00 cm/s AV Vmean:          120.500 cm/s AV VTI:             0.366 m AV Peak Grad:      14.4 mmHg AV Mean Grad:      7.0 mmHg LVOT Vmax:         84.55 cm/s LVOT Vmean:        52.650 cm/s LVOT VTI:          0.178 m LVOT/AV VTI ratio: 0.49  AORTA Ao Root diam: 3.40 cm MITRAL VALVE               TRICUSPID VALVE MV Area (PHT): 9.98 cm    TR Peak grad:   35.0 mmHg MV Area VTI:   2.82 cm    TR Vmax:        296.00 cm/s MV Peak grad:  3.4 mmHg MV Mean grad:  1.0 mmHg    SHUNTS MV Vmax:       0.92 m/s    Systemic VTI:  0.18 m MV Vmean:      41.2 cm/s   Systemic Diam: 2.00 cm MV Decel Time: 76 msec MV E velocity: 69.00 cm/s Vishnu Priya Mallipeddi Electronically signed by Lorelee Cover Mallipeddi Signature Date/Time: 09/22/2022/3:14:42 PM    Final    US RENAL  Result Date: 09/22/2022 CLINICAL DATA:  Acute renal failure. EXAM: RENAL / URINARY TRACT ULTRASOUND COMPLETE COMPARISON:  CT abdomen and pelvis 08/16/2018 FINDINGS: Right Kidney: Renal measurements: 11.5 x 6.6 x 6.5 cm = volume: 256 mL. Limited assessment due to body habitus and bowel. No gross hydronephrosis. Left Kidney: Renal measurements: 11.0 x 5.7 x 5.0 cm = volume: 166 mL. Echogenicity within normal limits. No mass or hydronephrosis visualized. Bladder: Appears normal for degree of bladder distention. Other: Incidental note of shadowing stones in the gallbladder. IMPRESSION: 1. Poor visualization of the right kidney. 2. No hydronephrosis. 3. Cholelithiasis. Electronically Signed   By: Logan Bores M.D.   On: 09/22/2022 10:36   CT Head Wo Contrast  Addendum Date: 09/22/2022   ADDENDUM REPORT: 09/22/2022 00:33 ADDENDUM: Please note findings should state partially visualized heterogeneous oval mass noted inferior to the right thyroid gland. Finding better evaluated on CT chest 08/13/2022 and ultrasound thyroid 08/13/2022. This has been evaluated on previous imaging. (ref: J Am Coll Radiol. 2015 Feb;12(2): 143-50). Electronically Signed   By: Iven Finn M.D.   On: 09/22/2022 00:33   Result Date:  09/22/2022 CLINICAL DATA:  Head trauma, minor (Age >= 65y); Neck trauma (Age >=  65y). Pt with a fall and laceration to L side of forehead. Pt is on blood thinners. Not complaining of any pain. EXAM: CT HEAD WITHOUT CONTRAST CT CERVICAL SPINE WITHOUT CONTRAST TECHNIQUE: Multidetector CT imaging of the head and cervical spine was performed following the standard protocol without intravenous contrast. Multiplanar CT image reconstructions of the cervical spine were also generated. RADIATION DOSE REDUCTION: This exam was performed according to the departmental dose-optimization program which includes automated exposure control, adjustment of the mA and/or kV according to patient size and/or use of iterative reconstruction technique. COMPARISON:  MRI head 04/03/2022 FINDINGS: CT HEAD FINDINGS Brain: Patchy and confluent areas of decreased attenuation are noted throughout the deep and periventricular white matter of the cerebral hemispheres bilaterally, compatible with chronic microvascular ischemic disease. No evidence of large-territorial acute infarction. No parenchymal hemorrhage. No mass lesion. No extra-axial collection. No mass effect or midline shift. No hydrocephalus. Basilar cisterns are patent. Vascular: No hyperdense vessel. Atherosclerotic calcifications are present within the cavernous internal carotid arteries. Skull: No acute fracture or focal lesion. Sinuses/Orbits: Right maxillary sinus mucosal thickening. Associated calcification. Right maxillary sinus wall hypertrophy. Paranasal sinuses and mastoid air cells are clear. Bilateral lens replacement. Otherwise the orbits are unremarkable. Other: No large hematoma formation. CT CERVICAL SPINE FINDINGS Alignment: Normal. Skull base and vertebrae: Multilevel moderate severe degenerative changes spine most prominent at the C4 through C7 levels. Associated bulky posterior osteophyte complex formations in likely PLL calcification. Associated multilevel severe  osseous neural foraminal and central canal stenosis. No acute fracture. No aggressive appearing focal osseous lesion or focal pathologic process. Soft tissues and spinal canal: No prevertebral fluid or swelling. No visible canal hematoma. Upper chest: Biapical assess for the spine biapical pleural/pulmonary scarring. Other: None. IMPRESSION: 1. No acute intracranial abnormality. 2. No acute displaced fracture or traumatic listhesis of the cervical spine. 3. 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 4. Chronic right maxillary sinusitis. Electronically Signed: By: Iven Finn M.D. On: 09/21/2022 22:42   CT Cervical Spine Wo Contrast  Addendum Date: 09/22/2022   ADDENDUM REPORT: 09/22/2022 00:33 ADDENDUM: Please note findings should state partially visualized heterogeneous oval mass noted inferior to the right thyroid gland. Finding better evaluated on CT chest 08/13/2022 and ultrasound thyroid 08/13/2022. This has been evaluated on previous imaging. (ref: J Am Coll Radiol. 2015 Feb;12(2): 143-50). Electronically Signed   By: Iven Finn M.D.   On: 09/22/2022 00:33   Result Date: 09/22/2022 CLINICAL DATA:  Head trauma, minor (Age >= 65y); Neck trauma (Age >= 65y). Pt with a fall and laceration to L side of forehead. Pt is on blood thinners. Not complaining of any pain. EXAM: CT HEAD WITHOUT CONTRAST CT CERVICAL SPINE WITHOUT CONTRAST TECHNIQUE: Multidetector CT imaging of the head and cervical spine was performed following the standard protocol without intravenous contrast. Multiplanar CT image reconstructions of the cervical spine were also generated. RADIATION DOSE REDUCTION: This exam was performed according to the departmental dose-optimization program which includes automated exposure control, adjustment of the mA and/or kV according to patient size and/or use of iterative reconstruction technique. COMPARISON:  MRI head 04/03/2022 FINDINGS:  CT HEAD FINDINGS Brain: Patchy and confluent areas of decreased attenuation are noted throughout the deep and periventricular white matter of the cerebral hemispheres bilaterally, compatible with chronic microvascular ischemic disease. No evidence of large-territorial acute infarction. No parenchymal hemorrhage. No mass lesion. No extra-axial collection. No mass effect or midline shift. No hydrocephalus. Basilar cisterns are patent.  Vascular: No hyperdense vessel. Atherosclerotic calcifications are present within the cavernous internal carotid arteries. Skull: No acute fracture or focal lesion. Sinuses/Orbits: Right maxillary sinus mucosal thickening. Associated calcification. Right maxillary sinus wall hypertrophy. Paranasal sinuses and mastoid air cells are clear. Bilateral lens replacement. Otherwise the orbits are unremarkable. Other: No large hematoma formation. CT CERVICAL SPINE FINDINGS Alignment: Normal. Skull base and vertebrae: Multilevel moderate severe degenerative changes spine most prominent at the C4 through C7 levels. Associated bulky posterior osteophyte complex formations in likely PLL calcification. Associated multilevel severe osseous neural foraminal and central canal stenosis. No acute fracture. No aggressive appearing focal osseous lesion or focal pathologic process. Soft tissues and spinal canal: No prevertebral fluid or swelling. No visible canal hematoma. Upper chest: Biapical assess for the spine biapical pleural/pulmonary scarring. Other: None. IMPRESSION: 1. No acute intracranial abnormality. 2. No acute displaced fracture or traumatic listhesis of the cervical spine. 3. 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 4. Chronic right maxillary sinusitis. Electronically Signed: By: Iven Finn M.D. On: 09/21/2022 22:42   DG Hip Unilat With Pelvis 2-3 Views Right  Result Date: 09/21/2022 CLINICAL DATA:  Fall EXAM: DG HIP (WITH OR  WITHOUT PELVIS) 2-3V RIGHT COMPARISON:  None Available. FINDINGS: No fracture or dislocation is seen. Bilateral hip joint spaces are preserved. Visualized bony pelvis is intact. Degenerative changes of the lower lumbar spine. IMPRESSION: Negative. Electronically Signed   By: Julian Hy M.D.   On: 09/21/2022 22:54   DG Knee Complete 4 Views Right  Result Date: 09/21/2022 CLINICAL DATA:  Fall EXAM: RIGHT KNEE - COMPLETE 4+ VIEW COMPARISON:  None Available. FINDINGS: No fracture or dislocation is seen. The joint spaces are preserved. Visualized soft tissues are within normal limits. No suprapatellar knee joint effusion. IMPRESSION: Negative. Electronically Signed   By: Julian Hy M.D.   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 Mild left basilar opacity, favoring atelectasis. No pleural effusion or pneumothorax. The heart is normal in size. Postsurgical changes related to prior CABG. Median sternotomy. 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 Electronically Signed   By: Julian Hy M.D.   On: 09/21/2022 22:54    Kathie Dike, MD  [facility]  If 7PM-7AM, please contact night-coverage [email or web address]  09/24/2022, 6:30 PM   LOS: 2 days

## 2022-09-24 NOTE — Evaluation (Signed)
Physical Therapy Evaluation Patient Details Name: Rodney Barnett MRN: [id number] DOB: [birth year] Today's Date: 09/24/2022  History of Present Illness  Rodney Barnett is a 81 y.o. male.  He is here for evaluation of injuries after a fall at home.  He said he was using the commode and when he bent over to stand up he fell forward and hit his head.  He does not think he lost consciousness.  He is complaining of some pain on the top of his head.  He also noticed some pain in his right hip and knee after a fall a few weeks ago.  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  He also had a thyroid mass.  He elected to be discharged and hospice is involved.  Blood pressures were low on discharge.  Patient states they remained low.  He denies any chest pain shortness of breath or abdominal pain at this time.   Clinical Impression  Patient able to sit up at bedside with slightly labored movement with mostly Min guard assist, good return for completing sit to stands using RW when transferring to chair with labored movement, verbal/tactile cueing for proper hand placement during stand to sitting, tolerated walking to doorway and back to bedside without loss of balance, but limited mostly due to c/o fatigue/lack of sleep.  Patient tolerated sitting up in chair after therapy - nursing staff notified.  Patient will benefit from continued skilled physical therapy in hospital and recommended venue below to increase strength, balance, endurance for safe ADLs and gait.       Recommendations for follow up therapy are one component of a multi-disciplinary discharge planning process, led by the attending physician.  Recommendations may be updated based on patient status, additional functional criteria and insurance authorization.  Follow Up Recommendations Home health PT      Assistance Recommended at Discharge Intermittent Supervision/Assistance  Patient can return home with the  following  A little help with walking and/or transfers;A little help with bathing/dressing/bathroom;Help with stairs or ramp for entrance;Assistance with cooking/housework    Equipment Recommendations None recommended by PT  Recommendations for Other Services       Functional Status Assessment Patient has had a recent decline in their functional status and demonstrates the ability to make significant improvements in function in a reasonable and predictable amount of time.     Precautions / Restrictions Precautions Precautions: Fall Restrictions Weight Bearing Restrictions: No      Mobility  Bed Mobility Overal bed mobility: Needs Assistance Bed Mobility: Supine to Sit     Supine to sit: Min guard     General bed mobility comments: labored movement    Transfers Overall transfer level: Needs assistance Equipment used: Rolling walker (2 wheels) Transfers: Sit to/from Stand, Bed to chair/wheelchair/BSC Sit to Stand: Min guard   Step pivot transfers: Min guard       General transfer comment: slightly labored movement    Ambulation/Gait Ambulation/Gait assistance: Min guard, Min assist Gait Distance (Feet): 30 Feet Assistive device: Rolling walker (2 wheels) Gait Pattern/deviations: Decreased step length - left, Decreased stance time - right, Decreased stride length Gait velocity: decreased     General Gait Details: slow slightly labored cadence without loss of balance, limited mostly due to c/o fatigue  Stairs            Wheelchair Mobility    Modified Rankin (Stroke Patients Only)  Balance Overall balance assessment: Needs assistance Sitting-balance support: Feet supported, No upper extremity supported Sitting balance-Leahy Scale: Good Sitting balance - Comments: seated at EOB   Standing balance support: During functional activity, No upper extremity supported Standing balance-Leahy Scale: Fair Standing balance comment: using RW                              Pertinent Vitals/Pain Pain Assessment Pain Assessment: No/denies pain    Home Living Family/patient expects to be discharged to:: Private residence Living Arrangements: Children Available Help at Discharge: Family;Available PRN/intermittently Type of Home: House Home Access: Ramped entrance       Home Layout: One level Home Equipment: Conservation officer, nature (2 wheels);Wheelchair - manual      Prior Function Prior Level of Function : Needs assist       Physical Assist : Mobility (physical);ADLs (physical) Mobility (physical): Bed mobility;Transfers;Gait;Stairs   Mobility Comments: household ambulator using RW ADLs Comments: assisted by family     Hand Dominance        Extremity/Trunk Assessment   Upper Extremity Assessment Upper Extremity Assessment: Overall WFL for tasks assessed    Lower Extremity Assessment Lower Extremity Assessment: Generalized weakness    Cervical / Trunk Assessment Cervical / Trunk Assessment: Kyphotic  Communication   Communication: No difficulties  Cognition Arousal/Alertness: Awake/alert Behavior During Therapy: WFL for tasks assessed/performed Overall Cognitive Status: Within Functional Limits for tasks assessed                                 General Comments: occasionally appears confused        General Comments      Exercises     Assessment/Plan    PT Assessment Patient needs continued PT services  PT Problem List Decreased strength;Decreased activity tolerance;Decreased balance;Decreased mobility       PT Treatment Interventions DME instruction;Stair training;Gait training;Functional mobility training;Therapeutic activities;Therapeutic exercise;Patient/family education;Balance training    PT Goals (Current goals can be found in the Care Plan section)  Acute Rehab PT Goals Patient Stated Goal: return home with family to assist PT Goal Formulation: With patient Time For Goal  Achievement: 10/01/22 Potential to Achieve Goals: Good    Frequency Min 3X/week     Co-evaluation               AM-PAC PT "6 Clicks" Mobility  Outcome Measure Help needed turning from your back to your side while in a flat bed without using bedrails?: None Help needed moving from lying on your back to sitting on the side of a flat bed without using bedrails?: A Little Help needed moving to and from a bed to a chair (including a wheelchair)?: A Little Help needed standing up from a chair using your arms (e.g., wheelchair or bedside chair)?: A Little Help needed to walk in hospital room?: A Little Help needed climbing 3-5 steps with a railing? : A Lot 6 Click Score: 18    End of Session   Activity Tolerance: Patient tolerated treatment well;Patient limited by fatigue Patient left: in chair;with call bell/phone within reach;with chair alarm set Nurse Communication: Mobility status PT Visit Diagnosis: Unsteadiness on feet (R26.81);Other abnormalities of gait and mobility (R26.89);Muscle weakness (generalized) (M62.81)    Time: 1501-1530 PT Time Calculation (min) (ACUTE ONLY): 29 min   Charges:   PT Evaluation $PT Eval Moderate Complexity: 1 Mod PT Treatments $Therapeutic  Activity: 23-37 mins        3:46 PM, 09/24/22 Lonell Grandchild, MPT Physical Therapist with [facility] [phone] office [phone] mobile phone

## 2022-09-24 NOTE — Care Management Important Message (Signed)
Important Message  Patient Details  Name: Rodney Barnett MRN: [id number] Date of Birth: 09-13-1941   Medicare Important Message Given:  N/A - LOS <3 / Initial given by admissions     Tommy Medal 09/24/2022, 2:53 PM

## 2022-09-24 NOTE — Plan of Care (Signed)
  Problem: Acute Rehab PT Goals(only PT should resolve) Goal: Pt Will Go Supine/Side To Sit Outcome: Progressing [place] (Taken 09/24/2022 1547) Pt will go Supine/Side to Sit:  with modified independence  with supervision Goal: Patient Will Transfer Sit To/From Stand Outcome: Progressing [place] (Taken 09/24/2022 1547) Patient will transfer sit to/from stand:  with supervision  with min guard assist Goal: Pt Will Transfer Bed To Chair/Chair To Bed Outcome: Progressing [place]she[place] (Taken 09/24/2022 1547) Pt will Transfer Bed to Chair/Chair to Bed:  with supervision  min guard assist Goal: Pt Will Ambulate Outcome: Progressing [place]she[place] (Taken 09/24/2022 1547) Pt will Ambulate:  50 feet  with supervision  with min guard assist  with rolling walker  3:47 PM, 09/24/22 Lonell Grandchild, MPT Physical Therapist with [facility] [phone] office [phone] mobile phone

## 2022-09-25 DIAGNOSIS — Z7189 Other specified counseling: Secondary | ICD-10-CM

## 2022-09-25 DIAGNOSIS — Z66 Do not resuscitate: Secondary | ICD-10-CM

## 2022-09-25 LAB — CREATININE, SERUM
Creatinine, Ser: 2.3 mg/dL — ABNORMAL HIGH (ref 0.61–1.24)
GFR, Estimated: 28 mL/min — ABNORMAL LOW (ref >60)

## 2022-09-25 MED ORDER — DILTIAZEM HCL ER COATED BEADS 180 MG PO CP24: 180.0000 mg | ORAL_CAPSULE | Freq: Every day | ORAL | 1 refills | Status: DC

## 2022-09-25 MED ORDER — DILTIAZEM HCL ER COATED BEADS 120 MG PO CP24
120.0000 mg | ORAL_CAPSULE | Freq: Every day | ORAL | Status: DC
  Administered 2022-09-25: 120 mg via ORAL
  Filled 2022-09-25: qty 1

## 2022-09-25 MED ORDER — MIDODRINE HCL 5 MG PO TABS: 5.0000 mg | ORAL_TABLET | Freq: Three times a day (TID) | ORAL | 0 refills | Status: DC

## 2022-09-25 MED ORDER — GABAPENTIN 300 MG PO CAPS: 300.0000 mg | ORAL_CAPSULE | Freq: Two times a day (BID) | ORAL | 0 refills | Status: DC

## 2022-09-25 MED ORDER — FUROSEMIDE 40 MG PO TABS
40.0000 mg | ORAL_TABLET | Freq: Every day | ORAL | Status: DC
  Administered 2022-09-25: 40 mg via ORAL
  Filled 2022-09-25: qty 1

## 2022-09-25 MED ORDER — APIXABAN 2.5 MG PO TABS: 2.5000 mg | ORAL_TABLET | Freq: Two times a day (BID) | ORAL | 0 refills | Status: DC

## 2022-09-25 MED ORDER — SODIUM BICARBONATE 650 MG PO TABS: 650.0000 mg | ORAL_TABLET | Freq: Two times a day (BID) | ORAL | 0 refills | Status: DC

## 2022-09-25 NOTE — Discharge Summary (Signed)
Physician Discharge Summary  Rodney Barnett [facility] CBS:[id number] DOB: 05-Jul-1941 DOA: 09/21/2022  PCP: [name], MD  Admit date: 09/21/2022 Discharge date: 09/25/2022  Admitted From: Home Disposition: Home  Recommendations for Outpatient Follow-up:  Follow up with PCP in 1-2 weeks Patient was to be discharged home with hospice services  Discharge Condition: Stable CODE STATUS: DNR Diet recommendation: Heart healthy  Brief/Interim Summary: 81 year old male with a history of coronary artery disease status post CABG, bladder cancer status post TURBT, atrial fibrillation, GI bleed, chronic back pain, COPD, hypertension, hyperlipidemia presenting after a fall at home.  Patient was on the commode when he was trying to get up and fell forward and hit his head.  The patient denies syncope.  He states that his oral intake has been poor.  He lives alone with his grandson next-door.  The patient denies any new medications.  He denies any focal extremity weakness, dysarthria, visual disturbance.  He has generalized weakness.  He denies any fevers, chills, chest pain, shortness breath, coughing, hemoptysis, nausea, vomiting, diarrhea, abdominal pain. In the ED, the patient was afebrile and hypotensive.  Oxygen saturation was 100% on room air.  WBC 8.0, hemoglobin 10.0, platelets 184,000.  Sodium 133, potassium 6.4, bicarbonate 20, serum creatinine is 4.24.  LFTs were unremarkable.  Troponin 6>>6>>6.  EKG showed atrial fibrillation with nonspecific T wave changes.  BNP 158.  CT of the brain was negative for any acute findings.  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  There was no cervical fractures, but showed severe central canal stenosis.  Discharge Diagnoses:  Principal Problem:   Hyperkalemia Active Problems:   Hyperlipidemia   Essential hypertension   CORONARY ATHEROSCLEROSIS NATIVE CORONARY ARTERY   Thyroid nodule   Coronary artery disease  involving coronary bypass graft of native heart without angina pectoris   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 (HCC)   Hyponatremia  Hyperkalemia -Improved with Lokelma   Hypotension -Procalcitonin 0.1 -Cortisol elevated -Lactic acid 2.2>>1.2 -Discontinue antibiotics, no signs of sepsis -Requiring oral midodrine and IV vasopressors -Was able to wean off of IV vasopressors in a.m. on 12/20 -Continue to monitor blood pressures -Can consider weaning off midodrine as an outpatient as blood pressure will allow   Generalized weakness -The patient has had numerous hospital admissions secondary to falls -Ultimate plan is to go home with hospice if he can be stabilized -PT evaluation completed with recommendations for home health -Family is able to provide appropriate care after discharge with hospice support   Acute on chronic renal failure--CKD3a -baseline creatinine 1.2-1.5 -presented with serum creatinine 4.24 -due to hypotension and volume depletion -Currently creatinine is down to 2.3 -Hold further IV fluids since he does appear to be developing volume overload   Acute on chronic diastolic congestive heart failure -Precipitated by IV fluid hydration for renal failure -Echo shows normal EF -He has been started back on his home dose of Lasix   Atrial fibrillation with RVR -Developed rapid atrial fibrillation since rate control was held due to issues with hypotension -Once blood pressure stabilized, metoprolol was resumed.  He briefly required Cardizem infusion to help control heart rate and has since been started on oral Cardizem in addition to metoprolol -Heart rate is currently reasonably controlled -He is anticoagulated with apixaban     Coronary artery disease -No chest pain presently -Status post CABG -Personally reviewed EKG--atrial fibrillation, nonspecific ST changes   Mixed hyperlipidemia -Continue statin  Chronic back  pain -Restart hydrocodone  -Would avoid restarting gabapentin since he is having tremors   Bilateral tremors -Having difficulty holding a spoon to feed himself due to tremors -May be related to higher dose gabapentin in the setting of decreased renal clearance -Hold further gabapentin for now -Ammonia normal -Tremors are improved   Goals of care -Patient is followed by hospice at home -DNR -Plan is to discharge home with hospice services.  His grandson has moved in with him to help care for him after discharge  Discharge Instructions  Discharge Instructions     Diet - low sodium heart healthy   Complete by: As directed    Increase activity slowly   Complete by: As directed       Allergies as of 09/25/2022       Reactions   Lipitor [atorvastatin] Other (See Comments)   myalgia        Medication List     STOP taking these medications    Fish Oil 1000 MG Cpdr   isosorbide mononitrate 30 MG 24 hr tablet Commonly known as: IMDUR       TAKE these medications    albuterol (2.5 MG/3ML) 0.083% nebulizer solution Commonly known as: PROVENTIL Take 2.5 mg by nebulization every 4 (four) hours as needed for wheezing or shortness of breath.   apixaban 2.5 MG Tabs tablet Commonly known as: ELIQUIS Take 1 tablet (2.5 mg total) by mouth 2 (two) times daily. What changed:  medication strength how much to take   diltiazem 180 MG 24 hr capsule Commonly known as: CARDIZEM CD Take 1 capsule (180 mg total) by mouth daily. Start taking on: September 26, 2022   fluticasone 50 MCG/ACT nasal spray Commonly known as: FLONASE Place 2 sprays into both nostrils daily.   furosemide 40 MG tablet Commonly known as: Lasix Take 1 tablet (40 mg total) by mouth daily.   gabapentin 300 MG capsule Commonly known as: NEURONTIN Take 1 capsule (300 mg total) by mouth 2 (two) times daily. What changed: See the new instructions.   HYDROcodone-acetaminophen 10-325 MG tablet Commonly  known as: NORCO Take 1 tablet by mouth every 6 (six) hours as needed (using for sleep).   metoprolol tartrate 25 MG tablet Commonly known as: LOPRESSOR Take 1 tablet (25 mg total) by mouth 2 (two) times daily.   midodrine 5 MG tablet Commonly known as: PROAMATINE Take 1 tablet (5 mg total) by mouth 3 (three) times daily with meals.   nitroGLYCERIN 0.4 MG SL tablet Commonly known as: Nitrostat Place 1 tablet (0.4 mg total) under the tongue every 5 (five) minutes x 3 doses as needed for chest pain (If no relief after 3rd dose, proceed to the ED for an evaluation or call 911).   pantoprazole 40 MG tablet Commonly known as: PROTONIX Take 1 tablet (40 mg total) by mouth daily.   potassium chloride SA 20 MEQ tablet Commonly known as: KLOR-CON M Take 1 tablet (20 mEq total) by mouth daily.   rosuvastatin 20 MG tablet Commonly known as: CRESTOR TAKE 1 TABLET ONCE DAILY.   sodium bicarbonate 650 MG tablet Take 1 tablet (650 mg total) by mouth 2 (two) times daily.        Follow-up Information     Burchette, Alinda Sierras, MD. Schedule an appointment as soon as possible for a visit in 2 week(s).   Specialty: Family Medicine Contact information: [address] [place] [address] [phone]  Allergies  Allergen Reactions   Lipitor [Atorvastatin] Other (See Comments)    myalgia    Consultations: Palliative care   Procedures/Studies: DG CHEST PORT 1 VIEW  Result Date: 09/24/2022 CLINICAL DATA:  Shortness of breath, AFib EXAM: PORTABLE CHEST 1 VIEW COMPARISON:  09/21/2022 FINDINGS: Cardiomegaly/post median sternotomy and CABG. Dense, masslike consolidation of the medial infrahilar right lung. Diffuse bilateral interstitial pulmonary opacity. Osseous structures unremarkable. IMPRESSION: 1. Dense, masslike consolidation of the medial infrahilar right lung, better assessed by recent prior CT. 2. Diffuse bilateral interstitial pulmonary opacity,  consistent with edema. 3. Cardiomegaly/post median sternotomy and CABG. Electronically Signed   By: Delanna Ahmadi M.D.   On: 09/24/2022 15:34   ECHOCARDIOGRAM COMPLETE  Result Date: 09/22/2022    ECHOCARDIOGRAM REPORT   Patient Name:   Rodney Barnett Date of Exam: 09/22/2022 Medical Rec #:  [id number]    Height:       73.0 in Accession #:    [id number]   Weight:       200.2 lb Date of Birth:  08-23-1941    BSA:          2.153 m Patient Age:    58 years     BP:           74/52 mmHg Patient Gender: M            HR:           104 bpm. Exam Location:  [facility] Procedure: 2D Echo, Cardiac Doppler, Color Doppler and Intracardiac            Opacification Agent Indications:    Atrial Fibrillation  History:        Patient has prior history of Echocardiogram examinations, most                 recent 08/14/2022. CHF, CAD, Prior CABG, Arrythmias:Atrial                 Fibrillation; Risk Factors:Hypertension and Dyslipidemia. Lung                 mass, Bladder CA, CKD.  Sonographer:    Wenda Low Referring Phys: [place]  1. Left ventricular ejection fraction, by estimation, is 55 to 60%. The left ventricle has normal function. The left ventricle has no regional wall motion abnormalities. Left ventricular diastolic function could not be evaluated.  2. Right ventricular systolic function is mildly reduced. The right ventricular size is moderately enlarged.  3. Left atrial size was mildly dilated.  4. Right atrial size was moderately dilated.  5. The mitral valve is grossly normal. Trivial mitral valve regurgitation. No evidence of mitral stenosis.  6. The tricuspid valve is abnormal. Tricuspid valve regurgitation is moderate.  7. The aortic valve is abnormal. There is moderate calcification of the aortic valve with moderate to severe restriction of leaflet mobility. Aortic valve regurgitation is not visualized. There is at least mild aortic valve stenosis visually. Aortic valve area, by VTI measures  1.36 cm. Aortic valve mean gradient measures 7.0 mmHg. Aortic valve Vmax measures 1.90 m/s. FINDINGS  Left Ventricle: Left ventricular ejection fraction, by estimation, is 55 to 60%. The left ventricle has normal function. The left ventricle has no regional wall motion abnormalities. Definity contrast agent was given IV to delineate the left ventricular  endocardial borders. The left ventricular internal cavity size was normal in size. There is no left ventricular hypertrophy. Left ventricular diastolic function could not be evaluated  due to atrial fibrillation. Left ventricular diastolic function could  not be evaluated. Right Ventricle: The right ventricular size is moderately enlarged. No increase in right ventricular wall thickness. Right ventricular systolic function is mildly reduced. Left Atrium: Left atrial size was mildly dilated. Right Atrium: Right atrial size was moderately dilated. Pericardium: There is no evidence of pericardial effusion. Mitral Valve: The mitral valve is grossly normal. Trivial mitral valve regurgitation. No evidence of mitral valve stenosis. MV peak gradient, 3.4 mmHg. The mean mitral valve gradient is 1.0 mmHg. Tricuspid Valve: The tricuspid valve is abnormal. Tricuspid valve regurgitation is moderate . No evidence of tricuspid stenosis. Aortic Valve: The aortic valve is abnormal. There is moderate calcification of the aortic valve. Aortic valve regurgitation is not visualized. Mild aortic stenosis is present. Aortic valve mean gradient measures 7.0 mmHg. Aortic valve peak gradient measures 14.4 mmHg. Aortic valve area, by VTI measures 1.53 cm. Pulmonic Valve: The pulmonic valve was not well visualized. Pulmonic valve regurgitation is not visualized. No evidence of pulmonic stenosis. Aorta: The aortic root is normal in size and structure. Venous: The inferior vena cava was not well visualized. IAS/Shunts: The interatrial septum was not assessed.  LEFT VENTRICLE PLAX 2D LVIDd:          4.30 cm LVIDs:         2.80 cm LV PW:         0.90 cm LV IVS:        1.00 cm LVOT diam:     2.00 cm LV SV:         56 LV SV Index:   26 LVOT Area:     3.14 cm  RIGHT VENTRICLE RV Basal diam:  4.20 cm RV Mid diam:    2.70 cm LEFT ATRIUM             Index        RIGHT ATRIUM           Index LA diam:        4.30 cm 2.00 cm/m   RA Area:     23.60 cm LA Vol (A2C):   87.9 ml 40.84 ml/m  RA Volume:   76.20 ml  35.40 ml/m LA Vol (A4C):   77.6 ml 36.05 ml/m LA Biplane Vol: 84.2 ml 39.12 ml/m  AORTIC VALVE                     PULMONIC VALVE AV Area (Vmax):    1.40 cm      PV Vmax:       1.10 m/s AV Area (Vmean):   1.37 cm      PV Peak grad:  4.8 mmHg AV Area (VTI):     1.53 cm AV Vmax:           190.00 cm/s AV Vmean:          120.500 cm/s AV VTI:            0.366 m AV Peak Grad:      14.4 mmHg AV Mean Grad:      7.0 mmHg LVOT Vmax:         84.55 cm/s LVOT Vmean:        52.650 cm/s LVOT VTI:          0.178 m LVOT/AV VTI ratio: 0.49  AORTA Ao Root diam: 3.40 cm MITRAL VALVE               TRICUSPID VALVE MV  Area (PHT): 9.98 cm    TR Peak grad:   35.0 mmHg MV Area VTI:   2.82 cm    TR Vmax:        296.00 cm/s MV Peak grad:  3.4 mmHg MV Mean grad:  1.0 mmHg    SHUNTS MV Vmax:       0.92 m/s    Systemic VTI:  0.18 m MV Vmean:      41.2 cm/s   Systemic Diam: 2.00 cm MV Decel Time: 76 msec MV E velocity: 69.00 cm/s Vishnu Priya Mallipeddi Electronically signed by Lorelee Cover Mallipeddi Signature Date/Time: 09/22/2022/3:14:42 PM    Final    US RENAL  Result Date: 09/22/2022 CLINICAL DATA:  Acute renal failure. EXAM: RENAL / URINARY TRACT ULTRASOUND COMPLETE COMPARISON:  CT abdomen and pelvis 08/16/2018 FINDINGS: Right Kidney: Renal measurements: 11.5 x 6.6 x 6.5 cm = volume: 256 mL. Limited assessment due to body habitus and bowel. No gross hydronephrosis. Left Kidney: Renal measurements: 11.0 x 5.7 x 5.0 cm = volume: 166 mL. Echogenicity within normal limits. No mass or hydronephrosis visualized. Bladder: Appears  normal for degree of bladder distention. Other: Incidental note of shadowing stones in the gallbladder. IMPRESSION: 1. Poor visualization of the right kidney. 2. No hydronephrosis. 3. Cholelithiasis. Electronically Signed   By: Logan Bores M.D.   On: 09/22/2022 10:36   CT Head Wo Contrast  Addendum Date: 09/22/2022   ADDENDUM REPORT: 09/22/2022 00:33 ADDENDUM: Please note findings should state partially visualized heterogeneous oval mass noted inferior to the right thyroid gland. Finding better evaluated on CT chest 08/13/2022 and ultrasound thyroid 08/13/2022. This has been evaluated on previous imaging. (ref: J Am Coll Radiol. 2015 Feb;12(2): 143-50). Electronically Signed   By: Iven Finn M.D.   On: 09/22/2022 00:33   Result Date: 09/22/2022 CLINICAL DATA:  Head trauma, minor (Age >= 65y); Neck trauma (Age >= 65y). Pt with a fall and laceration to L side of forehead. Pt is on blood thinners. Not complaining of any pain. EXAM: CT HEAD WITHOUT CONTRAST CT CERVICAL SPINE WITHOUT CONTRAST TECHNIQUE: Multidetector CT imaging of the head and cervical spine was performed following the standard protocol without intravenous contrast. Multiplanar CT image reconstructions of the cervical spine were also generated. RADIATION DOSE REDUCTION: This exam was performed according to the departmental dose-optimization program which includes automated exposure control, adjustment of the mA and/or kV according to patient size and/or use of iterative reconstruction technique. COMPARISON:  MRI head 04/03/2022 FINDINGS: CT HEAD FINDINGS Brain: Patchy and confluent areas of decreased attenuation are noted throughout the deep and periventricular white matter of the cerebral hemispheres bilaterally, compatible with chronic microvascular ischemic disease. No evidence of large-territorial acute infarction. No parenchymal hemorrhage. No mass lesion. No extra-axial collection. No mass effect or midline shift. No hydrocephalus.  Basilar cisterns are patent. Vascular: No hyperdense vessel. Atherosclerotic calcifications are present within the cavernous internal carotid arteries. Skull: No acute fracture or focal lesion. Sinuses/Orbits: Right maxillary sinus mucosal thickening. Associated calcification. Right maxillary sinus wall hypertrophy. Paranasal sinuses and mastoid air cells are clear. Bilateral lens replacement. Otherwise the orbits are unremarkable. Other: No large hematoma formation. CT CERVICAL SPINE FINDINGS Alignment: Normal. Skull base and vertebrae: Multilevel moderate severe degenerative changes spine most prominent at the C4 through C7 levels. Associated bulky posterior osteophyte complex formations in likely PLL calcification. Associated multilevel severe osseous neural foraminal and central canal stenosis. No acute fracture. No aggressive appearing focal osseous lesion or focal pathologic process. Soft  tissues and spinal canal: No prevertebral fluid or swelling. No visible canal hematoma. Upper chest: Biapical assess for the spine biapical pleural/pulmonary scarring. Other: None. IMPRESSION: 1. No acute intracranial abnormality. 2. No acute displaced fracture or traumatic listhesis of the cervical spine. 3. 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 4. Chronic right maxillary sinusitis. Electronically Signed: By: Iven Finn M.D. On: 09/21/2022 22:42   CT Cervical Spine Wo Contrast  Addendum Date: 09/22/2022   ADDENDUM REPORT: 09/22/2022 00:33 ADDENDUM: Please note findings should state partially visualized heterogeneous oval mass noted inferior to the right thyroid gland. Finding better evaluated on CT chest 08/13/2022 and ultrasound thyroid 08/13/2022. This has been evaluated on previous imaging. (ref: J Am Coll Radiol. 2015 Feb;12(2): 143-50). Electronically Signed   By: Iven Finn M.D.   On: 09/22/2022 00:33   Result Date: 09/22/2022 CLINICAL DATA:   Head trauma, minor (Age >= 65y); Neck trauma (Age >= 65y). Pt with a fall and laceration to L side of forehead. Pt is on blood thinners. Not complaining of any pain. EXAM: CT HEAD WITHOUT CONTRAST CT CERVICAL SPINE WITHOUT CONTRAST TECHNIQUE: Multidetector CT imaging of the head and cervical spine was performed following the standard protocol without intravenous contrast. Multiplanar CT image reconstructions of the cervical spine were also generated. RADIATION DOSE REDUCTION: This exam was performed according to the departmental dose-optimization program which includes automated exposure control, adjustment of the mA and/or kV according to patient size and/or use of iterative reconstruction technique. COMPARISON:  MRI head 04/03/2022 FINDINGS: CT HEAD FINDINGS Brain: Patchy and confluent areas of decreased attenuation are noted throughout the deep and periventricular white matter of the cerebral hemispheres bilaterally, compatible with chronic microvascular ischemic disease. No evidence of large-territorial acute infarction. No parenchymal hemorrhage. No mass lesion. No extra-axial collection. No mass effect or midline shift. No hydrocephalus. Basilar cisterns are patent. Vascular: No hyperdense vessel. Atherosclerotic calcifications are present within the cavernous internal carotid arteries. Skull: No acute fracture or focal lesion. Sinuses/Orbits: Right maxillary sinus mucosal thickening. Associated calcification. Right maxillary sinus wall hypertrophy. Paranasal sinuses and mastoid air cells are clear. Bilateral lens replacement. Otherwise the orbits are unremarkable. Other: No large hematoma formation. CT CERVICAL SPINE FINDINGS Alignment: Normal. Skull base and vertebrae: Multilevel moderate severe degenerative changes spine most prominent at the C4 through C7 levels. Associated bulky posterior osteophyte complex formations in likely PLL calcification. Associated multilevel severe osseous neural foraminal and  central canal stenosis. No acute fracture. No aggressive appearing focal osseous lesion or focal pathologic process. Soft tissues and spinal canal: No prevertebral fluid or swelling. No visible canal hematoma. Upper chest: Biapical assess for the spine biapical pleural/pulmonary scarring. Other: None. IMPRESSION: 1. No acute intracranial abnormality. 2. No acute displaced fracture or traumatic listhesis of the cervical spine. 3. 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 4. Chronic right maxillary sinusitis. Electronically Signed: By: Iven Finn M.D. On: 09/21/2022 22:42   DG Hip Unilat With Pelvis 2-3 Views Right  Result Date: 09/21/2022 CLINICAL DATA:  Fall EXAM: DG HIP (WITH OR WITHOUT PELVIS) 2-3V RIGHT COMPARISON:  None Available. FINDINGS: No fracture or dislocation is seen. Bilateral hip joint spaces are preserved. Visualized bony pelvis is intact. Degenerative changes of the lower lumbar spine. IMPRESSION: Negative. Electronically Signed   By: Julian Hy M.D.   On: 09/21/2022 22:54   DG Knee Complete 4 Views Right  Result Date: 09/21/2022 CLINICAL DATA:  Fall  EXAM: RIGHT KNEE - COMPLETE 4+ VIEW COMPARISON:  None Available. FINDINGS: No fracture or dislocation is seen. The joint spaces are preserved. Visualized soft tissues are within normal limits. No suprapatellar knee joint effusion. IMPRESSION: Negative. Electronically Signed   By: Julian Hy M.D.   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 Mild left basilar opacity, favoring atelectasis. No pleural effusion or pneumothorax. The heart is normal in size. Postsurgical changes related to prior CABG. Median sternotomy.  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 Electronically Signed   By: Julian Hy M.D.   On: 09/21/2022 22:54      Subjective: He is feeling better today.  Denies any shortness of breath.  Eager to discharge home.  Discharge Exam: Vitals:   09/25/22 1230 09/25/22 1300 09/25/22 1354 09/25/22 1400  BP:  128/67  (!) 136/96  Pulse:  64  82  Resp:  (!) 33  (!) 32  Temp: (!) 96.6 F (35.9 C)  (!) 97.3 F (36.3 C)   TempSrc: Axillary  Oral   SpO2:  92%  93%  Weight:      Height:        General: Pt is alert, awake, not in acute distress Cardiovascular: RRR, S1/S2 +, no rubs, no gallops Respiratory: CTA bilaterally, no wheezing, no rhonchi Abdominal: Soft, NT, ND, bowel sounds + Extremities: no edema, no cyanosis    The results of significant diagnostics from this hospitalization (including imaging, microbiology, ancillary and laboratory) are listed below for reference.     Microbiology: Recent Results (from the past 240 hour(s))  Resp panel by RT-PCR (RSV, Flu A&B, Covid) Anterior Nasal Swab     Status: None   Collection Time: 09/22/22  8:11 AM   Specimen: Anterior Nasal Swab  Result Value Ref Range Status   SARS Coronavirus 2 by RT PCR NEGATIVE NEGATIVE Final    Comment: (NOTE) SARS-CoV-2 target nucleic acids are NOT DETECTED.  The SARS-CoV-2 RNA is generally detectable in upper respiratory specimens during the acute phase of infection. The lowest concentration of SARS-CoV-2 viral copies this assay can detect is 138 copies/mL. A negative result does not preclude SARS-Cov-2 infection and should not be used as the sole basis for treatment or other patient management decisions. A negative result may occur with  improper specimen collection/handling, submission of specimen other than nasopharyngeal swab, presence of viral mutation(s) within the areas targeted by this assay, and  inadequate number of viral copies(<138 copies/mL). A negative result must be combined with clinical observations, patient history, and epidemiological information. The expected result is Negative.  Fact Sheet for Patients:  EntrepreneurPulse.com.au  Fact Sheet for Healthcare Providers:  IncredibleEmployment.be  This test is no t yet approved or cleared by the [place] FDA and  has been authorized for detection and/or diagnosis of SARS-CoV-2 by FDA under an Emergency Use Authorization (EUA). This EUA will remain  in effect (meaning this test can be used) for the duration of the COVID-19 declaration under Section 564(b)(1) of the Act, 21 U.S.C.section 360bbb-3(b)(1), unless the authorization is terminated  or revoked sooner.       Influenza A by PCR NEGATIVE NEGATIVE Final   Influenza B by  PCR NEGATIVE NEGATIVE Final    Comment: (NOTE) The Xpert Xpress SARS-CoV-2/FLU/RSV plus assay is intended as an aid in the diagnosis of influenza from Nasopharyngeal swab specimens and should not be used as a sole basis for treatment. Nasal washings and aspirates are unacceptable for Xpert Xpress SARS-CoV-2/FLU/RSV testing.  Fact Sheet for Patients: EntrepreneurPulse.com.au  Fact Sheet for Healthcare Providers: IncredibleEmployment.be  This test is not yet approved or cleared by the [place] FDA and has been authorized for detection and/or diagnosis of SARS-CoV-2 by FDA under an Emergency Use Authorization (EUA). This EUA will remain in effect (meaning this test can be used) for the duration of the COVID-19 declaration under Section 564(b)(1) of the Act, 21 U.S.C. section 360bbb-3(b)(1), unless the authorization is terminated or revoked.     Resp Syncytial Virus by PCR NEGATIVE NEGATIVE Final    Comment: (NOTE) Fact Sheet for Patients: EntrepreneurPulse.com.au  Fact Sheet for Healthcare  Providers: IncredibleEmployment.be  This test is not yet approved or cleared by the [place] FDA and has been authorized for detection and/or diagnosis of SARS-CoV-2 by FDA under an Emergency Use Authorization (EUA). This EUA will remain in effect (meaning this test can be used) for the duration of the COVID-19 declaration under Section 564(b)(1) of the Act, 21 U.S.C. section 360bbb-3(b)(1), unless the authorization is terminated or revoked.  Performed at [facility], [address] [place], [place] [address]   Culture, blood (Routine X 2) w Reflex to ID Panel     Status: None (Preliminary result)   Collection Time: 09/22/22  8:44 AM   Specimen: BLOOD  Result Value Ref Range Status   Specimen Description BLOOD BLOOD RIGHT ARM  Final   Special Requests   Final    BOTTLES DRAWN AEROBIC AND ANAEROBIC Blood Culture adequate volume BLOOD RIGHT ARM   Culture   Final    NO GROWTH 3 DAYS Performed at [facility], [address], [place], [place] [address]    Report Status PENDING  Incomplete  Culture, blood (Routine X 2) w Reflex to ID Panel     Status: None (Preliminary result)   Collection Time: 09/22/22  8:44 AM   Specimen: BLOOD  Result Value Ref Range Status   Specimen Description BLOOD BLOOD LEFT ARM  Final   Special Requests   Final    BOTTLES DRAWN AEROBIC AND ANAEROBIC Blood Culture results may not be optimal due to an inadequate volume of blood received in culture bottles BLOOD LEFT ARM   Culture   Final    NO GROWTH 3 DAYS Performed at [facility], [address] [place], [place] [address]    Report Status PENDING  Incomplete  MRSA Next Gen by PCR, Nasal     Status: None   Collection Time: 09/22/22 11:05 AM   Specimen: Nasal Mucosa; Nasal Swab  Result Value Ref Range Status   MRSA by PCR Next Gen NOT DETECTED NOT DETECTED Final    Comment: (NOTE) The GeneXpert MRSA Assay (FDA approved for NASAL specimens only), is one component of a  comprehensive MRSA colonization surveillance program. It is not intended to diagnose MRSA infection nor to guide or monitor treatment for MRSA infections. Test performance is not FDA approved in patients less than 55 years old. Performed at [facility], [address], [place], [place] [address]      Labs: BNP (last 3 results) Recent Labs    08/13/22 1231 09/21/22 2117  BNP 550.0* 003.7*   Basic Metabolic Panel: Recent  Labs  Lab 09/22/22 0732 09/22/22 1114 09/22/22 1602 09/23/22 0332 09/24/22 0410 09/25/22 0358  NA 135 136 136 137 141  --   K 5.7* 5.3* 5.3* 5.5* 4.1  --   CL 109 110 110 110 113*  --   CO2 18* 20* 19* 18* 19*  --   GLUCOSE 121* 137* 126* 171* 101*  --   BUN 70* 63* 67* 60* 53*  --   CREATININE 4.03* 3.68* 3.81* 3.21* 2.44* 2.30*  CALCIUM 8.3* 7.9* 8.2* 8.4* 8.7*  --    Liver Function Tests: Recent Labs  Lab 09/21/22 2141 09/23/22 0332  AST 26 22  ALT 10 10  ALKPHOS 73 71  BILITOT 0.8 0.8  PROT 6.5 6.2*  ALBUMIN 2.3* 2.2*   No results for input(s): "LIPASE", "AMYLASE" in the last 168 hours. Recent Labs  Lab 09/24/22 0410  AMMONIA 25   CBC: Recent Labs  Lab 09/21/22 2141 09/22/22 0429 09/23/22 0332 09/24/22 0410  WBC 8.0 6.1 10.1 6.5  NEUTROABS 6.7  --   --   --   HGB 10.0* 9.9* 10.5* 9.7*  HCT 32.9* 32.7* 33.5* 31.9*  MCV 95.1 95.9 93.3 94.4  PLT 184 143* 209 159   Cardiac Enzymes: No results for input(s): "CKTOTAL", "CKMB", "CKMBINDEX", "TROPONINI" in the last 168 hours. BNP: Invalid input(s): "POCBNP" CBG: Recent Labs  Lab 09/22/22 0016  GLUCAP 122*   D-Dimer No results for input(s): "DDIMER" in the last 72 hours. Hgb A1c No results for input(s): "HGBA1C" in the last 72 hours. Lipid Profile No results for input(s): "CHOL", "HDL", "LDLCALC", "TRIG", "CHOLHDL", "LDLDIRECT" in the last 72 hours. Thyroid function studies No results for input(s): "TSH", "T4TOTAL", "T3FREE", "THYROIDAB" in the last 72 hours.  Invalid  input(s): "FREET3" Anemia work up No results for input(s): "VITAMINB12", "FOLATE", "FERRITIN", "TIBC", "IRON", "RETICCTPCT" in the last 72 hours. Urinalysis    Component Value Date/Time   COLORURINE yellow 05/14/2010 0900   APPEARANCEUR Clear 05/14/2010 0900   LABSPEC <1.005 05/14/2010 0900   PHURINE 6.0 05/14/2010 0900   HGBUR large 05/14/2010 0900   BILIRUBINUR negative 02/17/2021 1139   PROTEINUR Negative 02/17/2021 1139   UROBILINOGEN 0.2 02/17/2021 1139   UROBILINOGEN 0.2 05/14/2010 0900   NITRITE positive 02/17/2021 1139   NITRITE positive 05/14/2010 0900   LEUKOCYTESUR Large (3+) (A) 02/17/2021 1139   Sepsis Labs Recent Labs  Lab 09/21/22 2141 09/22/22 0429 09/23/22 0332 09/24/22 0410  WBC 8.0 6.1 10.1 6.5   Microbiology Recent Results (from the past 240 hour(s))  Resp panel by RT-PCR (RSV, Flu A&B, Covid) Anterior Nasal Swab     Status: None   Collection Time: 09/22/22  8:11 AM   Specimen: Anterior Nasal Swab  Result Value Ref Range Status   SARS Coronavirus 2 by RT PCR NEGATIVE NEGATIVE Final    Comment: (NOTE) SARS-CoV-2 target nucleic acids are NOT DETECTED.  The SARS-CoV-2 RNA is generally detectable in upper respiratory specimens during the acute phase of infection. The lowest concentration of SARS-CoV-2 viral copies this assay can detect is 138 copies/mL. A negative result does not preclude SARS-Cov-2 infection and should not be used as the sole basis for treatment or other patient management decisions. A negative result may occur with  improper specimen collection/handling, submission of specimen other than nasopharyngeal swab, presence of viral mutation(s) within the areas targeted by this assay, and inadequate number of viral copies(<138 copies/mL). A negative result must be combined with clinical observations, patient history, and epidemiological information. The  expected result is Negative.  Fact Sheet for Patients:   EntrepreneurPulse.com.au  Fact Sheet for Healthcare Providers:  IncredibleEmployment.be  This test is no t yet approved or cleared by the [place] FDA and  has been authorized for detection and/or diagnosis of SARS-CoV-2 by FDA under an Emergency Use Authorization (EUA). This EUA will remain  in effect (meaning this test can be used) for the duration of the COVID-19 declaration under Section 564(b)(1) of the Act, 21 U.S.C.section 360bbb-3(b)(1), unless the authorization is terminated  or revoked sooner.       Influenza A by PCR NEGATIVE NEGATIVE Final   Influenza B by PCR NEGATIVE NEGATIVE Final    Comment: (NOTE) The Xpert Xpress SARS-CoV-2/FLU/RSV plus assay is intended as an aid in the diagnosis of influenza from Nasopharyngeal swab specimens and should not be used as a sole basis for treatment. Nasal washings and aspirates are unacceptable for Xpert Xpress SARS-CoV-2/FLU/RSV testing.  Fact Sheet for Patients: EntrepreneurPulse.com.au  Fact Sheet for Healthcare Providers: IncredibleEmployment.be  This test is not yet approved or cleared by the [place] FDA and has been authorized for detection and/or diagnosis of SARS-CoV-2 by FDA under an Emergency Use Authorization (EUA). This EUA will remain in effect (meaning this test can be used) for the duration of the COVID-19 declaration under Section 564(b)(1) of the Act, 21 U.S.C. section 360bbb-3(b)(1), unless the authorization is terminated or revoked.     Resp Syncytial Virus by PCR NEGATIVE NEGATIVE Final    Comment: (NOTE) Fact Sheet for Patients: EntrepreneurPulse.com.au  Fact Sheet for Healthcare Providers: IncredibleEmployment.be  This test is not yet approved or cleared by the [place] FDA and has been authorized for detection and/or diagnosis of SARS-CoV-2 by FDA under an Emergency Use  Authorization (EUA). This EUA will remain in effect (meaning this test can be used) for the duration of the COVID-19 declaration under Section 564(b)(1) of the Act, 21 U.S.C. section 360bbb-3(b)(1), unless the authorization is terminated or revoked.  Performed at [facility], [address] [place], [place] [address]   Culture, blood (Routine X 2) w Reflex to ID Panel     Status: None (Preliminary result)   Collection Time: 09/22/22  8:44 AM   Specimen: BLOOD  Result Value Ref Range Status   Specimen Description BLOOD BLOOD RIGHT ARM  Final   Special Requests   Final    BOTTLES DRAWN AEROBIC AND ANAEROBIC Blood Culture adequate volume BLOOD RIGHT ARM   Culture   Final    NO GROWTH 3 DAYS Performed at [facility], [address], [place], [place] [address]    Report Status PENDING  Incomplete  Culture, blood (Routine X 2) w Reflex to ID Panel     Status: None (Preliminary result)   Collection Time: 09/22/22  8:44 AM   Specimen: BLOOD  Result Value Ref Range Status   Specimen Description BLOOD BLOOD LEFT ARM  Final   Special Requests   Final    BOTTLES DRAWN AEROBIC AND ANAEROBIC Blood Culture results may not be optimal due to an inadequate volume of blood received in culture bottles BLOOD LEFT ARM   Culture   Final    NO GROWTH 3 DAYS Performed at [facility], [address] [place], [place] [address]    Report Status PENDING  Incomplete  MRSA Next Gen by PCR, Nasal     Status: None   Collection Time: 09/22/22 11:05 AM   Specimen: Nasal Mucosa; Nasal Swab  Result Value Ref  Range Status   MRSA by PCR Next Gen NOT DETECTED NOT DETECTED Final    Comment: (NOTE) The GeneXpert MRSA Assay (FDA approved for NASAL specimens only), is one component of a comprehensive MRSA colonization surveillance program. It is not intended to diagnose MRSA infection nor to guide or monitor treatment for MRSA infections. Test performance is not FDA approved in patients less than 44  years old. Performed at [facility], [address] [place], [place] [address]      Time coordinating discharge: 38mns  SIGNED:   JKathie Dike MD  [facility] 09/25/2022, 9:47 PM   If 7PM-7AM, please contact night-coverage [email or web address]

## 2022-09-25 NOTE — TOC Transition Note (Signed)
Transition of Care [facility]) - CM/SW Discharge Note   Patient Details  Name: Rodney Barnett MRN: [id number] Date of Birth: [birth year]  Transition of Care [facility]) CM/SW Contact:  Shade Flood, LCSW Phone Number: 09/25/2022, 1:26 PM   Clinical Narrative:     Pt stable for dc home with resumption of hospice care per MD. Pt's grandson will be with pt at home to assist with care. Updated Hospice, [facility], of pt's dc. No other TOC needs identified.  Final next level of care: Home w Hospice Care Barriers to Discharge: Barriers Resolved   Patient Goals and CMS Choice      Discharge Placement                         Discharge Plan and Services Additional resources added to the After Visit Summary for   In-house Referral: Clinical Social Work   Post Acute Care Choice: Resumption of Svcs/PTA Provider                               Social Determinants of Health (SDOH) Interventions SDOH Screenings   Food Insecurity: No Food Insecurity (09/23/2022)  Housing: Low Risk  (09/23/2022)  Transportation Needs: No Transportation Needs (09/23/2022)  Utilities: Not At Risk (09/23/2022)  Alcohol Screen: Low Risk  (06/27/2021)  Depression (PHQ2-9): Low Risk  (08/19/2022)  Financial Resource Strain: Low Risk  (06/27/2021)  Physical Activity: Insufficiently Active (06/27/2021)  Social Connections: Socially Isolated (06/27/2021)  Stress: No Stress Concern Present (06/27/2021)  Tobacco Use: Medium Risk (09/22/2022)     Readmission Risk Interventions    09/22/2022    1:32 PM  Readmission Risk Prevention Plan  Transportation Screening Complete  HRI or [place] Complete  Social Work Consult for [place] Planning/Counseling Complete  Palliative Care Screening Complete  Medication Review Press photographer) Complete

## 2022-09-25 NOTE — Progress Notes (Signed)
     Referral previously received for Ashely Barnett [facility] for goals of care discussion. Hospice has been following patient, established with them, plans to d/c back home to hospice. No PMT symptom management needs to date.   Chart reviewed for Recent provider notes, nurse notes, TOC notes, vitals, labs, and imaging and updates received from RN.   At this time patient appears stable, nearing ready for d/c to home with hospice. Possible d/c in 24-48 hours. No plan for in person follow-up at this time. Please contact the palliative medicine provider on service for any new/urgent needs that require our assistance with this patient.  Thank you for your referral and allowing PMT to assist in Rodney Barnett's care.   Walden Field, NP Palliative Medicine Team Phone: [phone]  NO CHARGE

## 2022-09-27 LAB — CULTURE, BLOOD (ROUTINE X 2)
Culture: NO GROWTH
Culture: NO GROWTH

## 2022-09-29 ENCOUNTER — Telehealth: Payer: Self-pay | Admitting: *Deleted

## 2022-09-29 NOTE — Telephone Encounter (Signed)
Transition Care Management Follow-up Telephone Call Date of discharge and from where: 09/25/2022 [facility] How have you been since you were released from the hospital? Doing very good  Any questions or concerns? No  Items Reviewed: Did the pt receive and understand the discharge instructions provided? Yes  Medications obtained and verified? Yes  Other? No  Any new allergies since your discharge? No  Dietary orders reviewed? Yes Do you have support at home? Yes   Home Care and Equipment/Supplies: Were home health services ordered? no If so, what is the name of the agency? NA  Has the agency set up a time to come to the patient's home? not applicable Were any new equipment or medical supplies ordered?  No What is the name of the medical supply agency? NA Were you able to get the supplies/equipment? not applicable Do you have any questions related to the use of the equipment or supplies? No  Functional Questionnaire: (I = Independent and D = Dependent) ADLs: i  Bathing/Dressing- i  Meal Prep- d  Eating- i  Maintaining continence- i  Transferring/Ambulation- i  Managing Meds- d  Follow up appointments reviewed:  PCP Hospital f/u appt confirmed? Yes  Scheduled to see Dr Elease Hashimoto on 10/26/2022 @ 10:15. [facility] f/u appt confirmed? No   Are transportation arrangements needed? No  If their condition worsens, is the pt aware to call PCP or go to the Emergency Dept.? Yes Was the patient provided with contact information for the PCP's office or ED? Yes Was to pt encouraged to call back with questions or concerns? Yes

## 2022-09-30 ENCOUNTER — Ambulatory Visit: Payer: Medicare Other | Admitting: Family Medicine

## 2022-10-26 ENCOUNTER — Other Ambulatory Visit: Payer: Self-pay | Admitting: Family Medicine

## 2022-10-26 ENCOUNTER — Ambulatory Visit: Payer: Medicare Other | Admitting: Family Medicine

## 2022-10-27 ENCOUNTER — Other Ambulatory Visit: Payer: Self-pay | Admitting: Family Medicine

## 2022-11-13 IMAGING — DX DG CHEST 2V
2 series · 2 of 2 positions shown · non-contrast
Comparison: Chest x-ray 09/06/2009

CLINICAL DATA: Shortness of breath, weakness

EXAM:
CHEST - 2 VIEW.  Patient is slightly rotated on the frontal view.

[w chest pa]
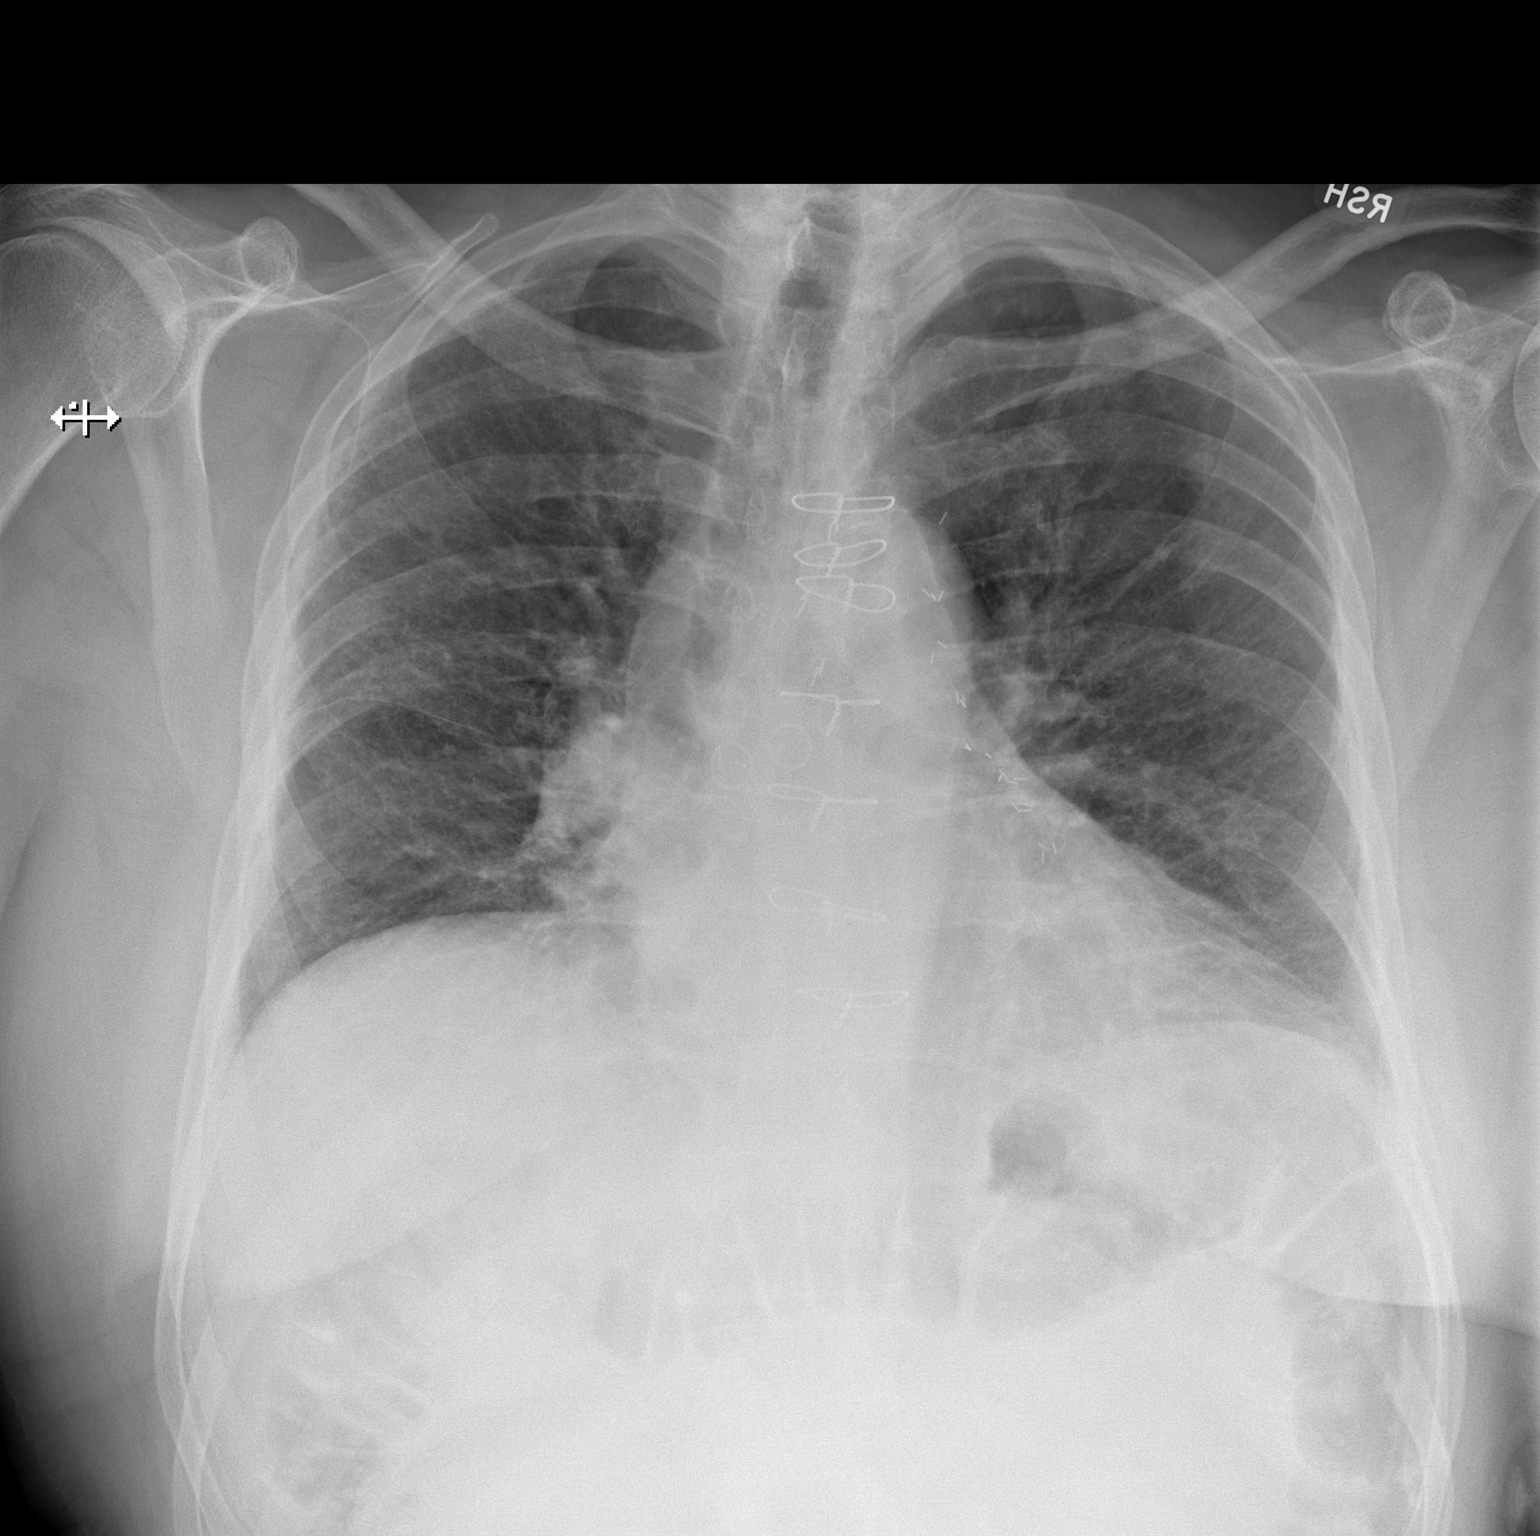

[w chest lat]
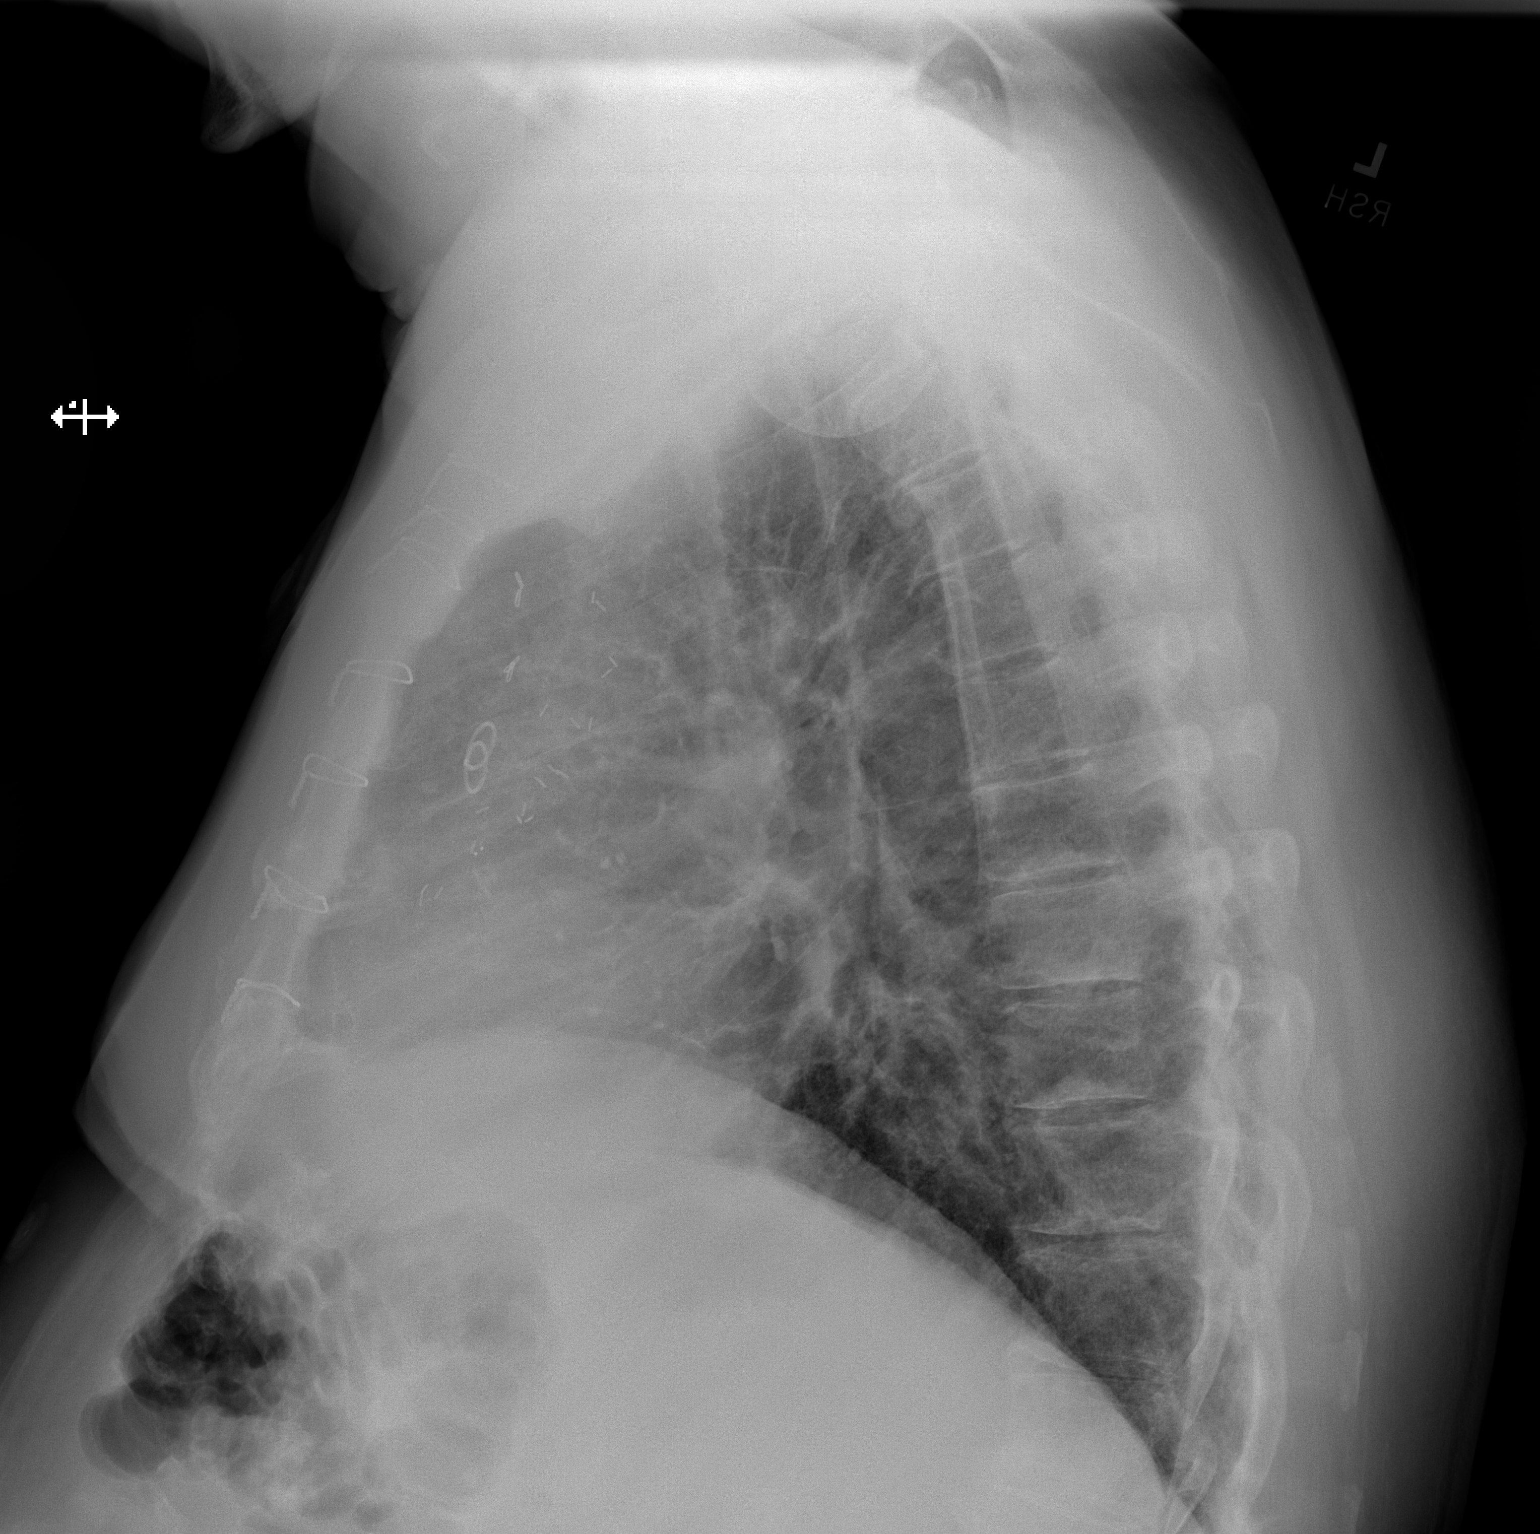

[2 of 2 positions shown; findings below may reference images not displayed]

FINDINGS: The heart and mediastinal contours are unchanged. Cardiac surgical
changes overlie the mediastinum.

Left base atelectasis. Slightly increased interstitial and airspace
markings within the left lower lobe. No focal consolidation. Chronic
coarsened interstitial markings with no overt pulmonary edema. No
pleural effusion. No pneumothorax.

No acute osseous abnormality.
IMPRESSION: Chronic bronchitic changes with query superimposed retrocardiac
infection/inflammation.

## 2022-11-20 ENCOUNTER — Other Ambulatory Visit: Payer: Self-pay | Admitting: Family Medicine

## 2022-11-24 ENCOUNTER — Other Ambulatory Visit: Payer: Self-pay | Admitting: Family Medicine

## 2022-11-27 ENCOUNTER — Other Ambulatory Visit: Payer: Self-pay | Admitting: Family Medicine

## 2022-12-08 ENCOUNTER — Telehealth: Payer: Self-pay | Admitting: Family Medicine

## 2022-12-08 NOTE — Telephone Encounter (Signed)
Pt had to falls on 12/05/22 and 1 fall on 12/06/22. Granddaughter in Psychiatric nurse was present, says he stumbles on carpet as he is not picking his feel up well

## 2022-12-09 NOTE — Telephone Encounter (Signed)
I spoke with the patient and he reported that he does have walker and does not need an order placed at this time. Patient stated he will be making an appointment soon to follow up with PCP

## 2022-12-18 ENCOUNTER — Other Ambulatory Visit: Payer: Self-pay

## 2022-12-18 MED ORDER — ROSUVASTATIN CALCIUM 20 MG PO TABS: 20.0000 mg | ORAL_TABLET | Freq: Every day | ORAL | 0 refills | Status: DC

## 2022-12-18 MED ORDER — LISINOPRIL 10 MG PO TABS: 10.0000 mg | ORAL_TABLET | Freq: Every day | ORAL | 0 refills | Status: DC

## 2023-02-03 DEATH — deceased
